# Patient Record
Sex: Female | Born: 1955 | Race: White | Hispanic: No | Marital: Married
Health system: Southern US, Academic
[De-identification: ages and names within clinical notes are randomized; demographics above are authoritative.]

## PROBLEM LIST (undated history)

## (undated) ENCOUNTER — Encounter: Attending: Internal Medicine | Primary: Internal Medicine

## (undated) ENCOUNTER — Encounter: Payer: PRIVATE HEALTH INSURANCE | Attending: Physician Assistant | Primary: Physician Assistant

## (undated) ENCOUNTER — Telehealth

## (undated) ENCOUNTER — Encounter

## (undated) ENCOUNTER — Ambulatory Visit

## (undated) ENCOUNTER — Encounter: Attending: Cardiovascular Disease | Primary: Cardiovascular Disease

## (undated) ENCOUNTER — Encounter: Attending: Ambulatory Care | Primary: Ambulatory Care

## (undated) ENCOUNTER — Ambulatory Visit: Payer: Medicare (Managed Care)

## (undated) ENCOUNTER — Other Ambulatory Visit: Attending: Ambulatory Care | Primary: Ambulatory Care

## (undated) ENCOUNTER — Ambulatory Visit: Payer: PRIVATE HEALTH INSURANCE

## (undated) ENCOUNTER — Ambulatory Visit: Payer: MEDICARE

## (undated) ENCOUNTER — Other Ambulatory Visit

## (undated) ENCOUNTER — Ambulatory Visit: Attending: Pharmacist | Primary: Pharmacist

## (undated) ENCOUNTER — Ambulatory Visit: Payer: PRIVATE HEALTH INSURANCE | Attending: Retina Specialist | Primary: Retina Specialist

## (undated) ENCOUNTER — Telehealth: Attending: Internal Medicine | Primary: Internal Medicine

## (undated) ENCOUNTER — Encounter: Payer: PRIVATE HEALTH INSURANCE | Attending: Internal Medicine | Primary: Internal Medicine

## (undated) ENCOUNTER — Ambulatory Visit: Attending: Internal Medicine | Primary: Internal Medicine

## (undated) ENCOUNTER — Ambulatory Visit: Attending: Family Medicine | Primary: Family Medicine

## (undated) ENCOUNTER — Ambulatory Visit: Payer: PRIVATE HEALTH INSURANCE | Attending: Internal Medicine | Primary: Internal Medicine

## (undated) ENCOUNTER — Ambulatory Visit: Payer: MEDICARE | Attending: Ambulatory Care | Primary: Ambulatory Care

## (undated) ENCOUNTER — Ambulatory Visit: Payer: PRIVATE HEALTH INSURANCE | Attending: Cardiovascular Disease | Primary: Cardiovascular Disease

## (undated) ENCOUNTER — Ambulatory Visit
Payer: PRIVATE HEALTH INSURANCE | Attending: Physical Medicine & Rehabilitation | Primary: Physical Medicine & Rehabilitation

## (undated) ENCOUNTER — Telehealth: Attending: Ambulatory Care | Primary: Ambulatory Care

## (undated) ENCOUNTER — Encounter: Attending: Diagnostic Radiology | Primary: Diagnostic Radiology

## (undated) DIAGNOSIS — E119 Type 2 diabetes mellitus without complications: Secondary | ICD-10-CM

## (undated) DIAGNOSIS — I10 Essential (primary) hypertension: Secondary | ICD-10-CM

## (undated) DIAGNOSIS — I2510 Atherosclerotic heart disease of native coronary artery without angina pectoris: Secondary | ICD-10-CM

## (undated) DIAGNOSIS — M353 Polymyalgia rheumatica: Secondary | ICD-10-CM

## (undated) DIAGNOSIS — E785 Hyperlipidemia, unspecified: Secondary | ICD-10-CM

## (undated) DIAGNOSIS — E669 Obesity, unspecified: Secondary | ICD-10-CM

## (undated) HISTORY — DX: Atherosclerotic heart disease of native coronary artery without angina pectoris: I25.10

## (undated) HISTORY — DX: Type 2 diabetes mellitus without complications: E11.9

## (undated) HISTORY — DX: Polymyalgia rheumatica: M35.3

## (undated) HISTORY — PX: CHOLECYSTECTOMY: SHX55

## (undated) HISTORY — DX: Essential (primary) hypertension: I10

## (undated) HISTORY — PX: ABDOMINAL HYSTERECTOMY: SHX81

---

## 1999-10-13 ENCOUNTER — Other Ambulatory Visit: Admission: RE | Admit: 1999-10-13 | Discharge: 1999-10-13 | Payer: Self-pay | Admitting: Obstetrics & Gynecology

## 2000-10-17 ENCOUNTER — Other Ambulatory Visit: Admission: RE | Admit: 2000-10-17 | Discharge: 2000-10-17 | Payer: Self-pay | Admitting: Obstetrics & Gynecology

## 2002-07-10 ENCOUNTER — Other Ambulatory Visit: Admission: RE | Admit: 2002-07-10 | Discharge: 2002-07-10 | Payer: Self-pay | Admitting: Obstetrics & Gynecology

## 2003-09-18 ENCOUNTER — Other Ambulatory Visit: Admission: RE | Admit: 2003-09-18 | Discharge: 2003-09-18 | Payer: Self-pay | Admitting: Obstetrics & Gynecology

## 2005-09-30 ENCOUNTER — Ambulatory Visit: Payer: Self-pay | Admitting: Cardiology

## 2005-09-30 ENCOUNTER — Ambulatory Visit (HOSPITAL_COMMUNITY): Admission: RE | Admit: 2005-09-30 | Discharge: 2005-09-30 | Payer: Self-pay | Admitting: Internal Medicine

## 2005-10-04 ENCOUNTER — Other Ambulatory Visit: Admission: RE | Admit: 2005-10-04 | Discharge: 2005-10-04 | Payer: Self-pay | Admitting: Obstetrics & Gynecology

## 2006-01-05 ENCOUNTER — Encounter (INDEPENDENT_AMBULATORY_CARE_PROVIDER_SITE_OTHER): Payer: Self-pay | Admitting: *Deleted

## 2006-01-05 ENCOUNTER — Ambulatory Visit (HOSPITAL_COMMUNITY): Admission: RE | Admit: 2006-01-05 | Discharge: 2006-01-05 | Payer: Self-pay | Admitting: Obstetrics & Gynecology

## 2008-11-10 ENCOUNTER — Ambulatory Visit: Payer: Self-pay | Admitting: Gastroenterology

## 2008-11-10 DIAGNOSIS — K594 Anal spasm: Secondary | ICD-10-CM | POA: Insufficient documentation

## 2008-11-21 ENCOUNTER — Ambulatory Visit: Payer: Self-pay | Admitting: Gastroenterology

## 2010-09-27 LAB — GLUCOSE, CAPILLARY: Glucose-Capillary: 113 mg/dL — ABNORMAL HIGH (ref 70–99)

## 2010-11-05 NOTE — Op Note (Signed)
NAMEJANAYSHA, DEPAULO             ACCOUNT NO.:  [id number]   MEDICAL RECORD NO.:  [id number]          PATIENT TYPE:  AMB   LOCATION:  SDC                           FACILITY:  [facility]   PHYSICIAN:  Freddy Finner, M.D.   DATE OF BIRTH:  08/20/1955   DATE OF PROCEDURE:  DATE OF DISCHARGE:                                 OPERATIVE REPORT   PREOPERATIVE DIAGNOSIS:  Chronic, persistent, complex ovarian cystic masses.   POSTOPERATIVE DIAGNOSIS:  Chronic, persistent, complex ovarian masses, plus  dense pelvic adhesions.   OPERATIVE PROCEDURE:  Laparoscopically-assisted bilateral salpingo-  oophorectomy, lysis of pelvic adhesions.   ANESTHESIA:  General.   INTRAOPERATIVE COMPLICATIONS:  None.   ESTIMATED INTRAOPERATIVE BLOOD LOSS:  Less than 250 milliliters.   The patient is a 55 year old who has previously had a hysterectomy.  She has  been found to have persistent complex left ovarian cysts, possibly  representing a dermoid.  There were some follicular-type cysts in the right  ovary.  Based on the patient's age, it was elected to proceed with bilateral  salpingo-oophorectomy.  She is admitted at this time for that purpose.  She  is admitted on the morning of surgery, brought to the operating room, placed  under adequate general endotracheal anesthesia and placed in the dorsal  lithotomy position using the [facility] stirrup system.  Betadine prep was  carried out in the usual fashion.  Bladder was evacuated using sterile  technique.  Sponge forceps with sponges on the tip was left in the vagina  for potential traction and identification during the procedure.  A small  intraumbilical skin incision was made and through it a 9 mm __________ blade  disposable trocar introduced, while elevating the anterior abdominal wall  manually.  Direct inspection with a laparoscope revealed adequate placement  with no evidence of injury on entry.  Pneumoperitoneum was allowed to  accumulate with carbon  dioxide gas.  Inspection of the pelvic contents and  anterior abdominal wall was carried out.  There were no adhesions in the  lower pelvis.  A small incision was made just above the symphysis and  through it another 11 mm non __________ disposable trocar was introduced.  There were some omental adhesions to the anterior abdominal wall partially  interfering with the visibility to the pelvis, and these were just to the  left of midline.  A portion of these were lysed using the Gyrus tripolar  device.  Upper abdomen was inspected, the liver appeared to be normal.  There were no apparent abnormalities.  There was an old suture remnant on  the cecum consistent with previous appendectomy.  The adnexal structures  including ovaries and tubes were adherent in the midline.  There was a lip  of sigmoid colon adherent to the peritoneum overlying bladder and vagina.  This was carefully lysed using the tripolar device.  Photographs were made  of pelvic findings.  Using the spring-loaded grasping forceps, the left  adnexa was elevated.  There were adhesions to the pelvic side wall.  With  great care, the infundibulopelvic ligament was enveloped, sealed, and  divided  using the Gyres device.  This dissection was carried close to the  ovary along the pelvic side wall in an attempt to avoid other structures.  Then, the ovary was dissected freely.  It was softly morcellated and  delivered through the lower trocar using a large grasping forceps commonly  called a claw in the OR.  The right side was __________ in a centrally  identical fashion and removed.  Small fragments of the ovary were retrieved  either through the operating channels of the laparoscope with the Tripolar  forceps, or through the lower trocar sleeve with a claw.  Copious irrigation  was carried out.  Hemostasis was noted to be adequate.  All visible evidence  of ovarian remnants were removed, hemostasis was complete.  All of the   irrigating solution was aspirated from the abdomen.  The instruments were removed, both incisions were closed with a deep suture  in the fascia of 0 Vicryl and subcuticular sutures of interrupted 3-0 Dexon.  Hemostasis was completed, the dressings were applied, the patient was taken  to recovery in good condition.      Freddy Finner, M.D.  Electronically Signed     WRN/MEDQ  D:  01/05/2006  T:  01/05/2006  Job:  [id number]

## 2010-11-05 NOTE — Procedures (Signed)
Cassie Cordova, Cassie Cordova             ACCOUNT NO.:  [id number]   MEDICAL RECORD NO.:  [id number]          PATIENT TYPE:  OUT   LOCATION:  RAD                           FACILITY:  [facility]   PHYSICIAN:  [place] Bing, M.D. [facility] OF BIRTH:  16-Jan-1956   DATE OF PROCEDURE:  09/30/2005  DATE OF DISCHARGE:                                  ECHOCARDIOGRAM   REFERRING PHYSICIAN:  Dr. Carylon Perches   CLINICAL DATA:  A 55 year old woman with arrhythmia and hypertension.   M-MODE:  Aorta 2.8, left atrium 4.6, septum 1.2, posterior wall 1.2, LV  diastole 4.1, LV systole 2.7.   IMPRESSION:  1.  Technically adequate echocardiographic study.  2.  Mild left atrial enlargement; right atrial size at the upper limit of      normal; normal right ventricle.  3.  Normal mitral valve with trivial regurgitation.  4.  Normal trileaflet aortic valve.  5.  Normal tricuspid valve; mild regurgitation; normal estimated right      ventricular systolic pressure.  6.  Normal pulmonic valve and proximal pulmonary artery.  7.  Normal left ventricular size; mild hypertrophy; normal regional and      global function.  8.  Normal inferior vena cava.      [address] Bing, M.D. [facility]  Electronically Signed     RR/MEDQ  D:  10/01/2005  T:  10/01/2005  Job:  [id number]

## 2011-12-07 ENCOUNTER — Other Ambulatory Visit: Payer: Self-pay | Admitting: Endocrinology

## 2011-12-07 ENCOUNTER — Ambulatory Visit
Admission: RE | Admit: 2011-12-07 | Discharge: 2011-12-07 | Disposition: A | Discharge status: Home / Self Care | Payer: BC Managed Care – PPO | Source: Ambulatory Visit | Attending: Endocrinology | Admitting: Endocrinology

## 2011-12-07 DIAGNOSIS — E049 Nontoxic goiter, unspecified: Secondary | ICD-10-CM

## 2011-12-12 ENCOUNTER — Other Ambulatory Visit: Payer: Self-pay | Admitting: Endocrinology

## 2011-12-12 DIAGNOSIS — E041 Nontoxic single thyroid nodule: Secondary | ICD-10-CM

## 2011-12-13 ENCOUNTER — Other Ambulatory Visit (HOSPITAL_COMMUNITY)
Admission: RE | Admit: 2011-12-13 | Discharge: 2011-12-13 | Disposition: A | Discharge status: Home / Self Care | Payer: BC Managed Care – PPO | Source: Ambulatory Visit | Attending: Interventional Radiology | Admitting: Interventional Radiology

## 2011-12-13 ENCOUNTER — Ambulatory Visit
Admission: RE | Admit: 2011-12-13 | Discharge: 2011-12-13 | Disposition: A | Discharge status: Home / Self Care | Payer: BC Managed Care – PPO | Source: Ambulatory Visit | Attending: Endocrinology | Admitting: Endocrinology

## 2011-12-13 DIAGNOSIS — E041 Nontoxic single thyroid nodule: Secondary | ICD-10-CM

## 2011-12-14 ENCOUNTER — Other Ambulatory Visit: Payer: BC Managed Care – PPO

## 2013-11-25 ENCOUNTER — Other Ambulatory Visit: Payer: Self-pay | Admitting: Endocrinology

## 2013-11-25 DIAGNOSIS — E010 Iodine-deficiency related diffuse (endemic) goiter: Secondary | ICD-10-CM

## 2013-11-28 ENCOUNTER — Ambulatory Visit
Admission: RE | Admit: 2013-11-28 | Discharge: 2013-11-28 | Disposition: A | Discharge status: Home / Self Care | Payer: BC Managed Care – PPO | Source: Ambulatory Visit | Attending: Endocrinology | Admitting: Endocrinology

## 2013-11-28 DIAGNOSIS — E010 Iodine-deficiency related diffuse (endemic) goiter: Secondary | ICD-10-CM

## 2014-02-15 ENCOUNTER — Encounter: Payer: Self-pay | Admitting: Gastroenterology

## 2014-12-29 ENCOUNTER — Other Ambulatory Visit: Payer: Self-pay | Admitting: Obstetrics & Gynecology

## 2014-12-30 LAB — CYTOLOGY - PAP

## 2015-01-29 LAB — HEMOGLOBIN A1C: Hgb A1c MFr Bld: 11 % — AB (ref 4.0–6.0)

## 2015-03-26 ENCOUNTER — Ambulatory Visit: Payer: Self-pay | Admitting: "Endocrinology

## 2015-04-16 LAB — HEMOGLOBIN A1C: Hgb A1c MFr Bld: 8.8 % — AB (ref 4.0–6.0)

## 2015-04-22 ENCOUNTER — Ambulatory Visit (INDEPENDENT_AMBULATORY_CARE_PROVIDER_SITE_OTHER): Payer: BLUE CROSS/BLUE SHIELD | Admitting: "Endocrinology

## 2015-04-22 ENCOUNTER — Encounter: Payer: Self-pay | Admitting: "Endocrinology

## 2015-04-22 ENCOUNTER — Ambulatory Visit: Payer: Self-pay | Admitting: "Endocrinology

## 2015-04-22 VITALS — BP 155/90 | HR 72 | Ht 61.0 in | Wt 170.0 lb

## 2015-04-22 DIAGNOSIS — E1159 Type 2 diabetes mellitus with other circulatory complications: Secondary | ICD-10-CM | POA: Insufficient documentation

## 2015-04-22 DIAGNOSIS — E785 Hyperlipidemia, unspecified: Secondary | ICD-10-CM

## 2015-04-22 DIAGNOSIS — E559 Vitamin D deficiency, unspecified: Secondary | ICD-10-CM | POA: Diagnosis not present

## 2015-04-22 DIAGNOSIS — I10 Essential (primary) hypertension: Secondary | ICD-10-CM

## 2015-04-22 DIAGNOSIS — E782 Mixed hyperlipidemia: Secondary | ICD-10-CM | POA: Insufficient documentation

## 2015-04-22 MED ORDER — LISINOPRIL 5 MG PO TABS: 5.0000 mg | ORAL_TABLET | Freq: Every day | ORAL | Status: DC

## 2015-04-22 MED ORDER — VITAMIN D (ERGOCALCIFEROL) 1.25 MG (50000 UNIT) PO CAPS: 50000.0000 [IU] | ORAL_CAPSULE | ORAL | Status: DC

## 2015-04-22 NOTE — Patient Instructions (Signed)

## 2015-04-22 NOTE — Progress Notes (Signed)
Subjective:    Patient ID: Cassie Cordova, female    DOB: Apr 07, 1956,    Past Medical History  Diagnosis Date  . Hypertension   . Diabetes mellitus, type II (HCC)   . CAD (coronary artery disease)    Past Surgical History  Procedure Laterality Date  . Cholecystectomy    . Abdominal hysterectomy    . Cesarean section     Social History   Social History  . Marital Status: Married    Spouse Name: N/A  . Number of Children: N/A  . Years of Education: N/A   Social History Main Topics  . Smoking status: Never Smoker   . Smokeless tobacco: None  . Alcohol Use: No  . Drug Use: No  . Sexual Activity: Not Asked   Other Topics Concern  . None   Social History Narrative  . None   Outpatient Encounter Prescriptions as of 04/22/2015  Medication Sig  . aspirin EC 81 MG tablet Take 81 mg by mouth daily.  Marland Kitchen. atorvastatin (LIPITOR) 20 MG tablet Take 20 mg by mouth daily.  . canagliflozin (INVOKANA) 100 MG TABS tablet Take 100 mg by mouth daily.  Marland Kitchen. co-enzyme Q-10 30 MG capsule Take 30 mg by mouth daily.  . Dulaglutide (TRULICITY) 0.75 MG/0.5ML SOPN Inject into the skin once a week.  . Insulin Glargine (TOUJEO SOLOSTAR) 300 UNIT/ML SOPN Inject 15 Units into the skin at bedtime.  Marland Kitchen. lisinopril (PRINIVIL,ZESTRIL) 5 MG tablet Take 1 tablet (5 mg total) by mouth daily.  . metoprolol succinate (TOPROL-XL) 25 MG 24 hr tablet Take 25 mg by mouth daily.  . nitroGLYCERIN (NITRODUR - DOSED IN MG/24 HR) 0.4 mg/hr patch Place 0.4 mg onto the skin daily.  Marland Kitchen. venlafaxine (EFFEXOR) 75 MG tablet Take 75 mg by mouth daily.   . [DISCONTINUED] lisinopril (PRINIVIL,ZESTRIL) 2.5 MG tablet Take 2.5 mg by mouth daily.  . Vitamin D, Ergocalciferol, (DRISDOL) 50000 UNITS CAPS capsule Take 1 capsule (50,000 Units total) by mouth every 7 (seven) days.   No facility-administered encounter medications on file as of 04/22/2015.   ALLERGIES: No Known Allergies VACCINATION STATUS:  There is no immunization  history on file for this patient.  Diabetes She presents for her follow-up diabetic visit. She has type 2 diabetes mellitus. Onset time: She was diagnosed at approximate age of 47 years. Her disease course has been improving. There are no hypoglycemic associated symptoms. Pertinent negatives for hypoglycemia include no confusion, headaches, pallor or seizures. Associated symptoms include fatigue. Pertinent negatives for diabetes include no chest pain, no polydipsia, no polyphagia and no polyuria. There are no hypoglycemic complications. Symptoms are improving. Diabetic complications include heart disease. Risk factors for coronary artery disease include diabetes mellitus, dyslipidemia, hypertension and sedentary lifestyle. Current diabetic treatment includes insulin injections and oral agent (monotherapy). She is compliant with treatment some of the time. Her weight is stable. She has had a previous visit with a dietitian. She rarely participates in exercise. Home blood sugar record trend: She did not bring her meter nor blood glucose logs. An ACE inhibitor/angiotensin II receptor blocker is being taken. Eye exam is current.  Hypertension This is a chronic problem. The problem is uncontrolled. Pertinent negatives include no chest pain, headaches, palpitations or shortness of breath. Risk factors for coronary artery disease include dyslipidemia, diabetes mellitus and sedentary lifestyle. Past treatments include ACE inhibitors. The current treatment provides no improvement.  Hyperlipidemia This is a chronic problem. The current episode started more than 1  year ago. The problem is controlled. Recent lipid tests were reviewed and are normal. Pertinent negatives include no chest pain, myalgias or shortness of breath. Current antihyperlipidemic treatment includes statins. Risk factors for coronary artery disease include dyslipidemia, diabetes mellitus, hypertension and obesity.     Review of Systems   Constitutional: Positive for fatigue. Negative for unexpected weight change.  HENT: Negative for trouble swallowing and voice change.   Eyes: Negative for visual disturbance.  Respiratory: Negative for cough, shortness of breath and wheezing.   Cardiovascular: Negative for chest pain, palpitations and leg swelling.       Over the last several weeks she felt heaviness in her chest, went to see her cardiologist , she did stress test today,  awaiting for her results..  Gastrointestinal: Negative for nausea, vomiting and diarrhea.  Endocrine: Negative for cold intolerance, heat intolerance, polydipsia, polyphagia and polyuria.  Musculoskeletal: Negative for myalgias and arthralgias.  Skin: Negative for color change, pallor, rash and wound.  Neurological: Negative for seizures and headaches.  Psychiatric/Behavioral: Negative for suicidal ideas and confusion.    Objective:    BP 155/90 mmHg  Pulse 72  Ht  (1.549 m)  Wt 170 lb (77.111 kg)  BMI 32.14 kg/m2  SpO2 96%  Wt Readings from Last 3 Encounters:  04/22/15 170 lb (77.111 kg)  11/10/08 159 lb (72.122 kg)    Physical Exam  Constitutional: She is oriented to person, place, and time. She appears well-developed.  HENT:  Head: Normocephalic and atraumatic.  Eyes: EOM are normal.  Neck: Normal range of motion. Neck supple. No tracheal deviation present. No thyromegaly present.  Cardiovascular: Normal rate and regular rhythm.   Pulmonary/Chest: Effort normal and breath sounds normal.  Abdominal: Soft. Bowel sounds are normal. There is no tenderness. There is no guarding.  Musculoskeletal: Normal range of motion. She exhibits no edema.  Neurological: She is alert and oriented to person, place, and time. She has normal reflexes. No cranial nerve deficit. Coordination normal.  Skin: Skin is warm and dry. No rash noted. No erythema. No pallor.  Psychiatric: She has a normal mood and affect. Judgment normal.    Results for orders  placed or performed in visit on 04/22/15  Hemoglobin A1c  Result Value Ref Range   Hgb A1c MFr Bld 8.8 (A) 4.0 - 6.0 %  Hemoglobin A1c  Result Value Ref Range   Hgb A1c MFr Bld 11.0 (A) 4.0 - 6.0 %   Complete Blood Count (Most recent): No results found for: WBC, HGB, HCT, MCV, PLT Chemistry (most recent): No results found for: NA, K, CL, CO2, BUN, CREATININE, GLUF Diabetic Labs (most recent): Lab Results  Component Value Date   HGBA1C 8.8* 04/16/2015   HGBA1C 11.0* 01/29/2015     Assessment & Plan:   1. Type 2 diabetes mellitus with vascular disease (HCC)  -Her diabetes is  complicated by coronary artery disease and patient remains at a high risk for more acute and chronic complications of diabetes which include CAD, CVA, CKD, retinopathy, and neuropathy. These are all discussed in detail with the patient.  Patient came with improved A1c of 8.8 %, liver she did not bring her meter nor blood glucose logs for review.  Recent labs reviewed.   - I have re-counseled the patient on diet management and weight loss  by adopting a carbohydrate restricted / protein rich  Diet.  - Suggestion is made for patient to avoid simple carbohydrates   from their diet including Cakes ,  Desserts, Ice Cream,  Soda (  diet and regular) , Sweet Tea , Candies,  Chips, Cookies, Artificial Sweeteners,   and "Sugar-free" Products .  This will help patient to have stable blood glucose profile and potentially avoid unintended  Weight gain.  - Patient is advised to stick to a routine mealtimes to eat 3 meals  a day and avoid unnecessary snacks ( to snack only to correct hypoglycemia).  - I have approached patient with the following individualized plan to manage diabetes and patient agrees.  - I will increase Toujeo to 20 units QHS, associated with strict monitoring of glucose  QAM.  - She would not need prandial insulin for now.  -Patient is encouraged to call clinic for blood glucose levels less than 70  or above 200 mg /dl. - I will continue Invokana 100 mg by mouth every morning, therapeutically suitable for patient. -She does not tolerate metformin. -I will continue Trulicity 0.75 mg weekly.  - Patient specific target  for A1c; LDL, HDL, Triglycerides, and  Waist Circumference were discussed in detail.  2) BP/HTN: Uncontrolled.  I advised her to increase fosinopril to 5 mg by mouth every morning  along with her metoprolol 25 mg by mouth every morning. She is hesitant to add medications.  3) Lipids/HPL:  continue statins. 4)  Weight/Diet: CDE consult in progress, exercise, and carbohydrates information provided.  5) Vitamin D deficiency -New diagnosis. I will initiate vitamin D 50,000 units by mouth weekly for the next 12 weeks.  5) Chronic Care/Health Maintenance:  -Patient  on ACEI/ARB and Statin medications and encouraged to continue to follow up with Ophthalmology, Podiatrist at least yearly or according to recommendations, and advised to  stay away from smoking. I have recommended yearly flu vaccine and pneumonia vaccination at least every 5 years; moderate intensity exercise for up to 150 minutes weekly; and  sleep for at least 7 hours a day.  I advised patient to maintain close follow up with their PCP for primary care needs.  Patient is asked to bring meter and  blood glucose logs during their next visit.   Follow up plan: Return in about 3 months (around 07/23/2015) for diabetes, high blood pressure, high cholesterol.  Marquis Lunch, MD Phone: [phone]  Fax: [phone]   04/22/2015, 8:47 PM

## 2015-06-08 ENCOUNTER — Other Ambulatory Visit: Payer: Self-pay | Admitting: "Endocrinology

## 2015-07-17 ENCOUNTER — Other Ambulatory Visit: Payer: Self-pay | Admitting: "Endocrinology

## 2015-07-17 LAB — BASIC METABOLIC PANEL
BUN: 14 mg/dL (ref 7–25)
CHLORIDE: 104 mmol/L (ref 98–110)
CO2: 26 mmol/L (ref 20–31)
Calcium: 8.9 mg/dL (ref 8.6–10.4)
Creat: 0.81 mg/dL (ref 0.50–1.05)
Glucose, Bld: 144 mg/dL — ABNORMAL HIGH (ref 65–99)
POTASSIUM: 4.4 mmol/L (ref 3.5–5.3)
SODIUM: 138 mmol/L (ref 135–146)

## 2015-07-17 LAB — HEMOGLOBIN A1C
Hgb A1c MFr Bld: 9.8 % — ABNORMAL HIGH (ref <5.7)
MEAN PLASMA GLUCOSE: 235 mg/dL — AB (ref <117)

## 2015-07-23 ENCOUNTER — Ambulatory Visit: Payer: BLUE CROSS/BLUE SHIELD | Admitting: "Endocrinology

## 2015-08-05 ENCOUNTER — Encounter: Payer: Self-pay | Admitting: "Endocrinology

## 2015-08-05 ENCOUNTER — Ambulatory Visit (INDEPENDENT_AMBULATORY_CARE_PROVIDER_SITE_OTHER): Payer: BLUE CROSS/BLUE SHIELD | Admitting: "Endocrinology

## 2015-08-05 VITALS — BP 140/88 | HR 68 | Ht 61.0 in | Wt 168.0 lb

## 2015-08-05 DIAGNOSIS — I10 Essential (primary) hypertension: Secondary | ICD-10-CM

## 2015-08-05 DIAGNOSIS — E1159 Type 2 diabetes mellitus with other circulatory complications: Secondary | ICD-10-CM

## 2015-08-05 DIAGNOSIS — E559 Vitamin D deficiency, unspecified: Secondary | ICD-10-CM

## 2015-08-05 DIAGNOSIS — E785 Hyperlipidemia, unspecified: Secondary | ICD-10-CM | POA: Diagnosis not present

## 2015-08-05 NOTE — Progress Notes (Signed)
Subjective:    Patient ID: Cassie Cordova, female    DOB: Jun 20, 1956,    Past Medical History  Diagnosis Date  . Hypertension   . Diabetes mellitus, type II (HCC)   . CAD (coronary artery disease)    Past Surgical History  Procedure Laterality Date  . Cholecystectomy    . Abdominal hysterectomy    . Cesarean section     Social History   Social History  . Marital Status: Married    Spouse Name: N/A  . Number of Children: N/A  . Years of Education: N/A   Social History Main Topics  . Smoking status: Never Smoker   . Smokeless tobacco: None  . Alcohol Use: No  . Drug Use: No  . Sexual Activity: Not Asked   Other Topics Concern  . None   Social History Narrative   Outpatient Encounter Prescriptions as of 08/05/2015  Medication Sig  . canagliflozin (INVOKANA) 100 MG TABS tablet Take 100 mg by mouth daily.  . Insulin Glargine (TOUJEO SOLOSTAR) 300 UNIT/ML SOPN Inject 30 Units into the skin at bedtime.  . TRULICITY 0.75 MG/0.5ML SOPN INJECT 0.75MG  UNDER THE SKIN ONCE WEEKLY  . aspirin EC 81 MG tablet Take 81 mg by mouth daily.  Marland Kitchen atorvastatin (LIPITOR) 20 MG tablet Take 20 mg by mouth daily.  Marland Kitchen co-enzyme Q-10 30 MG capsule Take 30 mg by mouth daily.  Marland Kitchen lisinopril (PRINIVIL,ZESTRIL) 5 MG tablet Take 1 tablet (5 mg total) by mouth daily.  . metoprolol succinate (TOPROL-XL) 25 MG 24 hr tablet Take 25 mg by mouth daily.  . nitroGLYCERIN (NITRODUR - DOSED IN MG/24 HR) 0.4 mg/hr patch Place 0.4 mg onto the skin daily.  Marland Kitchen venlafaxine (EFFEXOR) 75 MG tablet Take 75 mg by mouth daily.   . [DISCONTINUED] Vitamin D, Ergocalciferol, (DRISDOL) 50000 UNITS CAPS capsule Take 1 capsule (50,000 Units total) by mouth every 7 (seven) days.   No facility-administered encounter medications on file as of 08/05/2015.   ALLERGIES: No Known Allergies VACCINATION STATUS:  There is no immunization history on file for this patient.  Diabetes She presents for her follow-up diabetic  visit. She has type 2 diabetes mellitus. Onset time: She was diagnosed at approximate age of 47 years. Her disease course has been worsening. There are no hypoglycemic associated symptoms. Pertinent negatives for hypoglycemia include no confusion, headaches, pallor or seizures. Associated symptoms include fatigue. Pertinent negatives for diabetes include no chest pain, no polydipsia, no polyphagia and no polyuria. There are no hypoglycemic complications. Symptoms are worsening. Diabetic complications include heart disease. Risk factors for coronary artery disease include diabetes mellitus, dyslipidemia, hypertension and sedentary lifestyle. Current diabetic treatment includes insulin injections and oral agent (monotherapy). She is compliant with treatment some of the time (She admits inconsistency in taking her invokana and Trulicity. She also missed some doses of Toujeo as well.). Her weight is stable. She is following a generally unhealthy diet. When asked about meal planning, she reported none. She has had a previous visit with a dietitian. She rarely participates in exercise. Home blood sugar record trend: She did not bring her log, her meter shows in adequate monitoring average 187. Her overall blood glucose range is >200 mg/dl. An ACE inhibitor/angiotensin II receptor blocker is being taken. Eye exam is current.  Hypertension This is a chronic problem. The problem is uncontrolled. Pertinent negatives include no chest pain, headaches, palpitations or shortness of breath. Risk factors for coronary artery disease include dyslipidemia, diabetes mellitus  and sedentary lifestyle. Past treatments include ACE inhibitors. The current treatment provides no improvement.  Hyperlipidemia This is a chronic problem. The current episode started more than 1 year ago. The problem is controlled. Recent lipid tests were reviewed and are normal. Pertinent negatives include no chest pain, myalgias or shortness of breath.  Current antihyperlipidemic treatment includes statins. Risk factors for coronary artery disease include dyslipidemia, diabetes mellitus, hypertension and obesity.     Review of Systems  Constitutional: Positive for fatigue. Negative for unexpected weight change.  HENT: Negative for trouble swallowing and voice change.   Eyes: Negative for visual disturbance.  Respiratory: Negative for cough, shortness of breath and wheezing.   Cardiovascular: Negative for chest pain, palpitations and leg swelling.  Gastrointestinal: Negative for nausea, vomiting and diarrhea.  Endocrine: Negative for cold intolerance, heat intolerance, polydipsia, polyphagia and polyuria.  Musculoskeletal: Negative for myalgias and arthralgias.  Skin: Negative for color change, pallor, rash and wound.  Neurological: Negative for seizures and headaches.  Psychiatric/Behavioral: Negative for suicidal ideas and confusion.    Objective:    BP 140/88 mmHg  Pulse 68  Ht  (1.549 m)  Wt 168 lb (76.204 kg)  BMI 31.76 kg/m2  SpO2 98%  Wt Readings from Last 3 Encounters:  08/05/15 168 lb (76.204 kg)  04/22/15 170 lb (77.111 kg)  11/10/08 159 lb (72.122 kg)    Physical Exam  Constitutional: She is oriented to person, place, and time. She appears well-developed.  HENT:  Head: Normocephalic and atraumatic.  Eyes: EOM are normal.  Neck: Normal range of motion. Neck supple. No tracheal deviation present. No thyromegaly present.  Cardiovascular: Normal rate and regular rhythm.   Pulmonary/Chest: Effort normal and breath sounds normal.  Abdominal: Soft. Bowel sounds are normal. There is no tenderness. There is no guarding.  Musculoskeletal: Normal range of motion. She exhibits no edema.  Neurological: She is alert and oriented to person, place, and time. She has normal reflexes. No cranial nerve deficit. Coordination normal.  Skin: Skin is warm and dry. No rash noted. No erythema. No pallor.  Psychiatric: She has a  normal mood and affect. Judgment normal.    Results for orders placed or performed in visit on 07/17/15  Basic metabolic panel  Result Value Ref Range   Sodium 138 135 - 146 mmol/L   Potassium 4.4 3.5 - 5.3 mmol/L   Chloride 104 98 - 110 mmol/L   CO2 26 20 - 31 mmol/L   Glucose, Bld 144 (H) 65 - 99 mg/dL   BUN 14 7 - 25 mg/dL   Creat 4.74 2.59 - 5.63 mg/dL   Calcium 8.9 8.6 - 87.5 mg/dL  Hemoglobin I4P  Result Value Ref Range   Hgb A1c MFr Bld 9.8 (H) <5.7 %   Mean Plasma Glucose 235 (H) <117 mg/dL   Complete Blood Count (Most recent): No results found for: WBC, HGB, HCT, MCV, PLT Chemistry (most recent): Lab Results  Component Value Date   NA 138 07/17/2015   K 4.4 07/17/2015   CL 104 07/17/2015   CO2 26 07/17/2015   BUN 14 07/17/2015   CREATININE 0.81 07/17/2015   Diabetic Labs (most recent): Lab Results  Component Value Date   HGBA1C 9.8* 07/17/2015   HGBA1C 8.8* 04/16/2015   HGBA1C 11.0* 01/29/2015     Assessment & Plan:   1. Type 2 diabetes mellitus with vascular disease (HCC)  -Her diabetes is  complicated by coronary artery disease and patient remains at a high  risk for more acute and chronic complications of diabetes which include CAD, CVA, CKD, retinopathy, and neuropathy. These are all discussed in detail with the patient.  Patient came with worsening A1c of 9.8 from 8.8 %, mainly due to inconsistency in her medications.   - I have re-counseled the patient on diet management and weight loss  by adopting a carbohydrate restricted / protein rich  Diet.  - Suggestion is made for patient to avoid simple carbohydrates   from their diet including Cakes , Desserts, Ice Cream,  Soda (  diet and regular) , Sweet Tea , Candies,  Chips, Cookies, Artificial Sweeteners,   and "Sugar-free" Products .  This will help patient to have stable blood glucose profile and potentially avoid unintended  Weight gain.  - Patient is advised to stick to a routine mealtimes to eat 3  meals  a day and avoid unnecessary snacks ( to snack only to correct hypoglycemia).  - I have approached patient with the following individualized plan to manage diabetes and patient agrees.  - I will increase Toujeo to 30 units QHS, associated with strict monitoring of glucose   before meals and at bedtime . -She may need prandial insulin based on her readings when she comes back in a week.   -Patient is encouraged to call clinic for blood glucose levels less than 70 or above 200 mg /dl. - I advised her team to be more consistent and resume Invokana 100 mg by mouth every morning, therapeutically suitable for patient. -She does not tolerate metformin. -I will continue Trulicity 0.75 mg weekly.  - Patient specific target  for A1c; LDL, HDL, Triglycerides, and  Waist Circumference were discussed in detail.  2) BP/HTN: Improved.  I advised her to  continue fosinopril t5 mg by mouth every morning  along with her metoprolol 25 mg by mouth every morning.  3) Lipids/HPL:  continue statins. 4)  Weight/Diet: CDE consult in progress, exercise, and carbohydrates information provided.  5) Vitamin D deficiency -New diagnosis. I will initiate vitamin D 50,000 units by mouth weekly for the next 12 weeks.  5) Chronic Care/Health Maintenance:  -Patient  on ACEI/ARB and Statin medications and encouraged to continue to follow up with Ophthalmology, Podiatrist at least yearly or according to recommendations, and advised to  stay away from smoking. I have recommended yearly flu vaccine and pneumonia vaccination at least every 5 years; moderate intensity exercise for up to 150 minutes weekly; and  sleep for at least 7 hours a day.  I advised patient to maintain close follow up with their PCP for primary care needs.  Patient is asked to bring meter and  blood glucose logs during their next visit.   Follow up plan: Return in about 1 week (around 08/12/2015) for diabetes, high blood pressure, high cholesterol,  follow up with meter and logs- no labs.  Marquis Lunch, MD Phone: [phone]  Fax: [phone]   08/05/2015, 11:41 AM

## 2015-08-05 NOTE — Patient Instructions (Signed)

## 2015-08-12 ENCOUNTER — Encounter: Payer: Self-pay | Admitting: "Endocrinology

## 2015-08-12 ENCOUNTER — Ambulatory Visit (INDEPENDENT_AMBULATORY_CARE_PROVIDER_SITE_OTHER): Payer: BLUE CROSS/BLUE SHIELD | Admitting: "Endocrinology

## 2015-08-12 ENCOUNTER — Other Ambulatory Visit: Payer: Self-pay | Admitting: "Endocrinology

## 2015-08-12 VITALS — BP 102/63 | HR 62 | Ht 61.0 in | Wt 168.0 lb

## 2015-08-12 DIAGNOSIS — I10 Essential (primary) hypertension: Secondary | ICD-10-CM

## 2015-08-12 DIAGNOSIS — E785 Hyperlipidemia, unspecified: Secondary | ICD-10-CM | POA: Diagnosis not present

## 2015-08-12 DIAGNOSIS — E1159 Type 2 diabetes mellitus with other circulatory complications: Secondary | ICD-10-CM

## 2015-08-12 NOTE — Progress Notes (Signed)
Subjective:    Patient ID: Cassie Cordova, female    DOB: 1956/06/01,    Past Medical History  Diagnosis Date  . Hypertension   . Diabetes mellitus, type II (HCC)   . CAD (coronary artery disease)    Past Surgical History  Procedure Laterality Date  . Cholecystectomy    . Abdominal hysterectomy    . Cesarean section     Social History   Social History  . Marital Status: Married    Spouse Name: N/A  . Number of Children: N/A  . Years of Education: N/A   Social History Main Topics  . Smoking status: Never Smoker   . Smokeless tobacco: None  . Alcohol Use: No  . Drug Use: No  . Sexual Activity: Not Asked   Other Topics Concern  . None   Social History Narrative   Outpatient Encounter Prescriptions as of 08/12/2015  Medication Sig  . aspirin EC 81 MG tablet Take 81 mg by mouth daily.  Marland Kitchen atorvastatin (LIPITOR) 20 MG tablet Take 20 mg by mouth daily.  . canagliflozin (INVOKANA) 100 MG TABS tablet Take 100 mg by mouth daily.  Marland Kitchen co-enzyme Q-10 30 MG capsule Take 30 mg by mouth daily.  . Insulin Glargine (TOUJEO SOLOSTAR) 300 UNIT/ML SOPN Inject 24 Units into the skin at bedtime.  Marland Kitchen lisinopril (PRINIVIL,ZESTRIL) 5 MG tablet Take 1 tablet (5 mg total) by mouth daily.  . metoprolol succinate (TOPROL-XL) 25 MG 24 hr tablet Take 25 mg by mouth daily.  . nitroGLYCERIN (NITRODUR - DOSED IN MG/24 HR) 0.4 mg/hr patch Place 0.4 mg onto the skin daily.  . TRULICITY 0.75 MG/0.5ML SOPN INJECT 0.75MG  UNDER THE SKIN ONCE WEEKLY  . venlafaxine (EFFEXOR) 75 MG tablet Take 75 mg by mouth daily.    No facility-administered encounter medications on file as of 08/12/2015.   ALLERGIES: No Known Allergies VACCINATION STATUS:  There is no immunization history on file for this patient.  Diabetes She presents for her follow-up diabetic visit. She has type 2 diabetes mellitus. Onset time: She was diagnosed at approximate age of 47 years. Her disease course has been improving. There are  no hypoglycemic associated symptoms. Pertinent negatives for hypoglycemia include no confusion, headaches, pallor or seizures. Associated symptoms include fatigue. Pertinent negatives for diabetes include no chest pain, no polydipsia, no polyphagia and no polyuria. There are no hypoglycemic complications. Symptoms are improving. Diabetic complications include heart disease. Risk factors for coronary artery disease include diabetes mellitus, dyslipidemia, hypertension and sedentary lifestyle. Current diabetic treatment includes insulin injections and oral agent (monotherapy). She is compliant with treatment some of the time (She admits inconsistency in taking her invokana and Trulicity. She also missed some doses of Toujeo as well.). Her weight is stable. She is following a generally unhealthy diet. When asked about meal planning, she reported none. She has had a previous visit with a dietitian. She rarely participates in exercise. Home blood sugar record trend: She did  bring her log, her meter shows adequate monitoring average 144. Her overall blood glucose range is 130-140 mg/dl. An ACE inhibitor/angiotensin II receptor blocker is being taken. Eye exam is current.  Hypertension This is a chronic problem. The problem is uncontrolled. Pertinent negatives include no chest pain, headaches, palpitations or shortness of breath. Risk factors for coronary artery disease include dyslipidemia, diabetes mellitus and sedentary lifestyle. Past treatments include ACE inhibitors. The current treatment provides no improvement.  Hyperlipidemia This is a chronic problem. The current episode  started more than 1 year ago. The problem is controlled. Recent lipid tests were reviewed and are normal. Pertinent negatives include no chest pain, myalgias or shortness of breath. Current antihyperlipidemic treatment includes statins. Risk factors for coronary artery disease include dyslipidemia, diabetes mellitus, hypertension and  obesity.     Review of Systems  Constitutional: Positive for fatigue. Negative for unexpected weight change.  HENT: Negative for trouble swallowing and voice change.   Eyes: Negative for visual disturbance.  Respiratory: Negative for cough, shortness of breath and wheezing.   Cardiovascular: Negative for chest pain, palpitations and leg swelling.  Gastrointestinal: Negative for nausea, vomiting and diarrhea.  Endocrine: Negative for cold intolerance, heat intolerance, polydipsia, polyphagia and polyuria.  Musculoskeletal: Negative for myalgias and arthralgias.  Skin: Negative for color change, pallor, rash and wound.  Neurological: Negative for seizures and headaches.  Psychiatric/Behavioral: Negative for suicidal ideas and confusion.    Objective:    BP 102/63 mmHg  Pulse 62  Ht  (1.549 m)  Wt 168 lb (76.204 kg)  BMI 31.76 kg/m2  SpO2 98%  Wt Readings from Last 3 Encounters:  08/12/15 168 lb (76.204 kg)  08/05/15 168 lb (76.204 kg)  04/22/15 170 lb (77.111 kg)    Physical Exam  Constitutional: She is oriented to person, place, and time. She appears well-developed.  HENT:  Head: Normocephalic and atraumatic.  Eyes: EOM are normal.  Neck: Normal range of motion. Neck supple. No tracheal deviation present. No thyromegaly present.  Cardiovascular: Normal rate and regular rhythm.   Pulmonary/Chest: Effort normal and breath sounds normal.  Abdominal: Soft. Bowel sounds are normal. There is no tenderness. There is no guarding.  Musculoskeletal: Normal range of motion. She exhibits no edema.  Neurological: She is alert and oriented to person, place, and time. She has normal reflexes. No cranial nerve deficit. Coordination normal.  Skin: Skin is warm and dry. No rash noted. No erythema. No pallor.  Psychiatric: She has a normal mood and affect. Judgment normal.    Results for orders placed or performed in visit on 07/17/15  Basic metabolic panel  Result Value Ref Range    Sodium 138 135 - 146 mmol/L   Potassium 4.4 3.5 - 5.3 mmol/L   Chloride 104 98 - 110 mmol/L   CO2 26 20 - 31 mmol/L   Glucose, Bld 144 (H) 65 - 99 mg/dL   BUN 14 7 - 25 mg/dL   Creat 1.61 0.96 - 0.45 mg/dL   Calcium 8.9 8.6 - 40.9 mg/dL  Hemoglobin W1X  Result Value Ref Range   Hgb A1c MFr Bld 9.8 (H) <5.7 %   Mean Plasma Glucose 235 (H) <117 mg/dL   Complete Blood Count (Most recent): No results found for: WBC, HGB, HCT, MCV, PLT Chemistry (most recent): Lab Results  Component Value Date   NA 138 07/17/2015   K 4.4 07/17/2015   CL 104 07/17/2015   CO2 26 07/17/2015   BUN 14 07/17/2015   CREATININE 0.81 07/17/2015   Diabetic Labs (most recent): Lab Results  Component Value Date   HGBA1C 9.8* 07/17/2015   HGBA1C 8.8* 04/16/2015   HGBA1C 11.0* 01/29/2015     Assessment & Plan:   1. Type 2 diabetes mellitus with vascular disease (HCC)  -Her diabetes is  complicated by coronary artery disease and patient remains at a high risk for more acute and chronic complications of diabetes which include CAD, CVA, CKD, retinopathy, and neuropathy. These are all discussed in detail with  the patient.  Patient came with worsening A1c of 9.8% from 8.8 %, mainly due to inconsistency in her medications.   - I have re-counseled the patient on diet management and weight loss  by adopting a carbohydrate restricted / protein rich  Diet.  - Suggestion is made for patient to avoid simple carbohydrates   from their diet including Cakes , Desserts, Ice Cream,  Soda (  diet and regular) , Sweet Tea , Candies,  Chips, Cookies, Artificial Sweeteners,   and "Sugar-free" Products .  This will help patient to have stable blood glucose profile and potentially avoid unintended  Weight gain.  - Patient is advised to stick to a routine mealtimes to eat 3 meals  a day and avoid unnecessary snacks ( to snack only to correct hypoglycemia).  - I have approached patient with the following individualized plan to  manage diabetes and patient agrees.  - I will decrease Toujeo to 24 units QHS, associated with strict monitoring of glucose   Before breakfast and at bedtime. -She will not need  prandial insulin based on her readings  for now.    -Patient is encouraged to call clinic for blood glucose levels less than 70 or above 200 mg /dl. - I advised her team to be more consistent and resume Invokana 100 mg by mouth every morning, therapeutically suitable for patient. -She does not tolerate metformin. -I will continue Trulicity 0.75 mg weekly.  - Patient specific target  for A1c; LDL, HDL, Triglycerides, and  Waist Circumference were discussed in detail.  2) BP/HTN: Improved.  I advised her to  continue fosinopril t5 mg by mouth every morning  along with her metoprolol 25 mg by mouth every morning.  3) Lipids/HPL:  continue statins. 4)  Weight/Diet: CDE consult in progress, exercise, and carbohydrates information provided.  5) Vitamin D deficiency -New diagnosis. I will initiate vitamin D 50,000 units by mouth weekly for the next 12 weeks.  5) Chronic Care/Health Maintenance:  -Patient  on ACEI/ARB and Statin medications and encouraged to continue to follow up with Ophthalmology, Podiatrist at least yearly or according to recommendations, and advised to  stay away from smoking. I have recommended yearly flu vaccine and pneumonia vaccination at least every 5 years; moderate intensity exercise for up to 150 minutes weekly; and  sleep for at least 7 hours a day.  I advised patient to maintain close follow up with their PCP for primary care needs.  Patient is asked to bring meter and  blood glucose logs during their next visit.   Follow up plan: Return in about 11 weeks (around 10/28/2015) for diabetes, high blood pressure, high cholesterol, follow up with pre-visit labs, meter, and logs.  Marquis Lunch, MD Phone: [phone]  Fax: [phone]   08/12/2015, 4:08 PM

## 2015-08-12 NOTE — Patient Instructions (Signed)

## 2015-08-25 ENCOUNTER — Other Ambulatory Visit: Payer: Self-pay | Admitting: "Endocrinology

## 2015-10-27 ENCOUNTER — Other Ambulatory Visit: Payer: Self-pay | Admitting: "Endocrinology

## 2015-10-27 LAB — BASIC METABOLIC PANEL
BUN: 17 mg/dL (ref 7–25)
CALCIUM: 8.9 mg/dL (ref 8.6–10.4)
CHLORIDE: 107 mmol/L (ref 98–110)
CO2: 23 mmol/L (ref 20–31)
CREATININE: 0.76 mg/dL (ref 0.50–1.05)
GLUCOSE: 186 mg/dL — AB (ref 65–99)
Potassium: 4.4 mmol/L (ref 3.5–5.3)
Sodium: 140 mmol/L (ref 135–146)

## 2015-10-27 LAB — HEMOGLOBIN A1C
Hgb A1c MFr Bld: 9.3 % — ABNORMAL HIGH (ref <5.7)
Mean Plasma Glucose: 220 mg/dL

## 2015-11-02 ENCOUNTER — Ambulatory Visit (INDEPENDENT_AMBULATORY_CARE_PROVIDER_SITE_OTHER): Payer: BLUE CROSS/BLUE SHIELD | Admitting: "Endocrinology

## 2015-11-02 ENCOUNTER — Encounter: Payer: Self-pay | Admitting: "Endocrinology

## 2015-11-02 VITALS — BP 116/72 | HR 76 | Ht 61.0 in | Wt 167.0 lb

## 2015-11-02 DIAGNOSIS — E559 Vitamin D deficiency, unspecified: Secondary | ICD-10-CM | POA: Diagnosis not present

## 2015-11-02 DIAGNOSIS — E1159 Type 2 diabetes mellitus with other circulatory complications: Secondary | ICD-10-CM

## 2015-11-02 DIAGNOSIS — E785 Hyperlipidemia, unspecified: Secondary | ICD-10-CM

## 2015-11-02 DIAGNOSIS — I10 Essential (primary) hypertension: Secondary | ICD-10-CM

## 2015-11-02 MED ORDER — INSULIN GLARGINE 300 UNIT/ML ~~LOC~~ SOPN: 30.0000 [IU] | PEN_INJECTOR | Freq: Every day | SUBCUTANEOUS | Status: DC

## 2015-11-02 MED ORDER — DULAGLUTIDE 1.5 MG/0.5ML ~~LOC~~ SOAJ: 1.5000 mg | SUBCUTANEOUS | Status: DC

## 2015-11-02 NOTE — Progress Notes (Signed)
Subjective:    Patient ID: Cassie Cordova, female    DOB: [birth year],    Past Medical History  Diagnosis Date  . Hypertension   . Diabetes mellitus, type II (HCC)   . CAD (coronary artery disease)    Past Surgical History  Procedure Laterality Date  . Cholecystectomy    . Abdominal hysterectomy    . Cesarean section     Social History   Social History  . Marital Status: Married    Spouse Name: N/A  . Number of Children: N/A  . Years of Education: N/A   Social History Main Topics  . Smoking status: Never Smoker   . Smokeless tobacco: None  . Alcohol Use: No  . Drug Use: No  . Sexual Activity: Not Asked   Other Topics Concern  . None   Social History Narrative   Outpatient Encounter Prescriptions as of 11/02/2015  Medication Sig  . aspirin EC 81 MG tablet Take 81 mg by mouth daily.  Marland Kitchen atorvastatin (LIPITOR) 20 MG tablet Take 20 mg by mouth daily.  Marland Kitchen co-enzyme Q-10 30 MG capsule Take 30 mg by mouth daily.  . Dulaglutide (TRULICITY) 1.5 MG/0.5ML SOPN Inject 1.5 mg into the skin once a week.  . Insulin Glargine (TOUJEO SOLOSTAR) 300 UNIT/ML SOPN Inject 30 Units into the skin at bedtime.  . INVOKANA 100 MG TABS tablet TAKE 1 TABLET BY MOUTH ONCE DAILY.  Marland Kitchen lisinopril (PRINIVIL,ZESTRIL) 5 MG tablet Take 1 tablet (5 mg total) by mouth daily.  . metoprolol succinate (TOPROL-XL) 25 MG 24 hr tablet Take 25 mg by mouth daily.  . nitroGLYCERIN (NITRODUR - DOSED IN MG/24 HR) 0.4 mg/hr patch Place 0.4 mg onto the skin daily.  Marland Kitchen venlafaxine (EFFEXOR) 75 MG tablet Take 75 mg by mouth daily.   . [DISCONTINUED] TOUJEO SOLOSTAR 300 UNIT/ML SOPN INJECT 30 UNITS S.Q. ONCE DAILY AT BEDTIME.  . [DISCONTINUED] TRULICITY 0.75 MG/0.5ML SOPN INJECT 0.75MG  UNDER THE SKIN ONCE WEEKLY   No facility-administered encounter medications on file as of 11/02/2015.   ALLERGIES: No Known Allergies VACCINATION STATUS:  There is no immunization history on file for this patient.  Diabetes She  presents for her follow-up diabetic visit. She has type 2 diabetes mellitus. Onset time: She was diagnosed at approximate age of 47 years. Her disease course has been improving. There are no hypoglycemic associated symptoms. Pertinent negatives for hypoglycemia include no confusion, headaches, pallor or seizures. Associated symptoms include fatigue. Pertinent negatives for diabetes include no chest pain, no polydipsia, no polyphagia and no polyuria. There are no hypoglycemic complications. Symptoms are improving. Diabetic complications include heart disease. Risk factors for coronary artery disease include diabetes mellitus, dyslipidemia, hypertension and sedentary lifestyle. Current diabetic treatment includes insulin injections and oral agent (monotherapy). She is compliant with treatment some of the time (She admits inconsistency in taking her invokana and Trulicity. She also missed some doses of Toujeo as well.). Her weight is stable. She is following a generally unhealthy diet. When asked about meal planning, she reported none. She has had a previous visit with a dietitian. She rarely participates in exercise. Home blood sugar record trend: She did not  bring her log, nor logs. An ACE inhibitor/angiotensin II receptor blocker is being taken. Eye exam is current.  Hypertension This is a chronic problem. The problem is uncontrolled. Pertinent negatives include no chest pain, headaches, palpitations or shortness of breath. Risk factors for coronary artery disease include dyslipidemia, diabetes mellitus and sedentary lifestyle.  Past treatments include ACE inhibitors. The current treatment provides no improvement.  Hyperlipidemia This is a chronic problem. The current episode started more than 1 year ago. The problem is controlled. Recent lipid tests were reviewed and are normal. Pertinent negatives include no chest pain, myalgias or shortness of breath. Current antihyperlipidemic treatment includes statins.  Risk factors for coronary artery disease include dyslipidemia, diabetes mellitus, hypertension and obesity.     Review of Systems  Constitutional: Positive for fatigue. Negative for unexpected weight change.  HENT: Negative for trouble swallowing and voice change.   Eyes: Negative for visual disturbance.  Respiratory: Negative for cough, shortness of breath and wheezing.   Cardiovascular: Negative for chest pain, palpitations and leg swelling.  Gastrointestinal: Negative for nausea, vomiting and diarrhea.  Endocrine: Negative for cold intolerance, heat intolerance, polydipsia, polyphagia and polyuria.  Musculoskeletal: Negative for myalgias and arthralgias.  Skin: Negative for color change, pallor, rash and wound.  Neurological: Negative for seizures and headaches.  Psychiatric/Behavioral: Negative for suicidal ideas and confusion.    Objective:    BP 116/72 mmHg  Pulse 76  Ht 5\' 1"  (1.549 m)  Wt 167 lb (75.751 kg)  BMI 31.57 kg/m2  SpO2 98%  Wt Readings from Last 3 Encounters:  11/02/15 167 lb (75.751 kg)  08/12/15 168 lb (76.204 kg)  08/05/15 168 lb (76.204 kg)    Physical Exam  Constitutional: She is oriented to person, place, and time. She appears well-developed.  HENT:  Head: Normocephalic and atraumatic.  Eyes: EOM are normal.  Neck: Normal range of motion. Neck supple. No tracheal deviation present. No thyromegaly present.  Cardiovascular: Normal rate and regular rhythm.   Pulmonary/Chest: Effort normal and breath sounds normal.  Abdominal: Soft. Bowel sounds are normal. There is no tenderness. There is no guarding.  Musculoskeletal: Normal range of motion. She exhibits no edema.  Neurological: She is alert and oriented to person, place, and time. She has normal reflexes. No cranial nerve deficit. Coordination normal.  Skin: Skin is warm and dry. No rash noted. No erythema. No pallor.  Psychiatric: She has a normal mood and affect. Judgment normal.    Results for  orders placed or performed in visit on 10/27/15  Basic metabolic panel  Result Value Ref Range   Sodium 140 135 - 146 mmol/L   Potassium 4.4 3.5 - 5.3 mmol/L   Chloride 107 98 - 110 mmol/L   CO2 23 20 - 31 mmol/L   Glucose, Bld 186 (H) 65 - 99 mg/dL   BUN 17 7 - 25 mg/dL   Creat 9.600.76 4.540.50 - 0.981.05 mg/dL   Calcium 8.9 8.6 - 11.910.4 mg/dL  Hemoglobin J4NA1c  Result Value Ref Range   Hgb A1c MFr Bld 9.3 (H) <5.7 %   Mean Plasma Glucose 220 mg/dL   Complete Blood Count (Most recent): No results found for: WBC, HGB, HCT, MCV, PLT Chemistry (most recent): Lab Results  Component Value Date   NA 140 10/27/2015   K 4.4 10/27/2015   CL 107 10/27/2015   CO2 23 10/27/2015   BUN 17 10/27/2015   CREATININE 0.76 10/27/2015   Diabetic Labs (most recent): Lab Results  Component Value Date   HGBA1C 9.3* 10/27/2015   HGBA1C 9.8* 07/17/2015   HGBA1C 8.8* 04/16/2015     Assessment & Plan:   1. Type 2 diabetes mellitus with vascular disease (HCC)  -Her diabetes is  complicated by coronary artery disease and patient remains at a high risk for more acute and  chronic complications of diabetes which include CAD, CVA, CKD, retinopathy, and neuropathy. These are all discussed in detail with the patient.  Patient came with  Slightly improving A1c of 9.3 are sent from  9.8% , mainly due to inconsistency in her medications.   - I have re-counseled the patient on diet management and weight loss  by adopting a carbohydrate restricted / protein rich  Diet.  - Suggestion is made for patient to avoid simple carbohydrates   from their diet including Cakes , Desserts, Ice Cream,  Soda (  diet and regular) , Sweet Tea , Candies,  Chips, Cookies, Artificial Sweeteners,   and "Sugar-free" Products .  This will help patient to have stable blood glucose profile and potentially avoid unintended  Weight gain.  - Patient is advised to stick to a routine mealtimes to eat 3 meals  a day and avoid unnecessary snacks ( to  snack only to correct hypoglycemia).  - I have approached patient with the following individualized plan to manage diabetes and patient agrees.  - I will decrease Toujeo to 30 units QHS, associated with strict monitoring of glucose   Before breakfast and at bedtime. -She may need  need  prandial insulin based on her readings.    -Patient is encouraged to call clinic for blood glucose levels less than 70 or above 200 mg /dl. - I advised her team to be more consistent and resume Invokana 100 mg by mouth every morning, therapeutically suitable for patient. -She does not tolerate metformin. -I will increase Trulicity to 1.5 mg weekly.   - Patient specific target  for A1c; LDL, HDL, Triglycerides, and  Waist Circumference were discussed in detail.  2) BP/HTN: Improved.  I advised her to  continue fosinopril t5 mg by mouth every morning  along with her metoprolol 25 mg by mouth every morning.  3) Lipids/HPL:  continue statins. 4)  Weight/Diet: CDE consult in progress, exercise, and carbohydrates information provided.  5) Vitamin D deficiency She is status post therapy with vitamin D  50,000 units by mouth weekly for the next 12 weeks.  5) Chronic Care/Health Maintenance:  -Patient  on ACEI/ARB and Statin medications and encouraged to continue to follow up with Ophthalmology, Podiatrist at least yearly or according to recommendations, and advised to  stay away from smoking. I have recommended yearly flu vaccine and pneumonia vaccination at least every 5 years; moderate intensity exercise for up to 150 minutes weekly; and  sleep for at least 7 hours a day.  I advised patient to maintain close follow up with their PCP for primary care needs.  Patient is asked to bring meter and  blood glucose logs during their next visit.   Follow up plan: Return in about 2 weeks (around 11/16/2015) for diabetes, high blood pressure, high cholesterol, follow up with meter and logs- no labs.  Marquis Lunch,  MD Phone: [phone]  Fax: [phone]   11/02/2015, 4:53 PM

## 2015-11-02 NOTE — Patient Instructions (Signed)

## 2015-11-19 ENCOUNTER — Ambulatory Visit: Payer: BLUE CROSS/BLUE SHIELD | Admitting: "Endocrinology

## 2015-12-10 ENCOUNTER — Other Ambulatory Visit: Payer: Self-pay | Admitting: "Endocrinology

## 2015-12-18 ENCOUNTER — Other Ambulatory Visit: Payer: Self-pay | Admitting: "Endocrinology

## 2016-02-09 ENCOUNTER — Other Ambulatory Visit: Payer: Self-pay | Admitting: "Endocrinology

## 2016-03-10 ENCOUNTER — Other Ambulatory Visit: Payer: Self-pay | Admitting: "Endocrinology

## 2016-03-23 ENCOUNTER — Other Ambulatory Visit: Payer: Self-pay | Admitting: "Endocrinology

## 2016-03-23 DIAGNOSIS — E118 Type 2 diabetes mellitus with unspecified complications: Principal | ICD-10-CM

## 2016-03-23 DIAGNOSIS — E1165 Type 2 diabetes mellitus with hyperglycemia: Secondary | ICD-10-CM

## 2016-03-28 LAB — BASIC METABOLIC PANEL
BUN: 12 mg/dL (ref 7–25)
CALCIUM: 8.9 mg/dL (ref 8.6–10.4)
CO2: 24 mmol/L (ref 20–31)
CREATININE: 0.77 mg/dL (ref 0.50–1.05)
Chloride: 103 mmol/L (ref 98–110)
GLUCOSE: 357 mg/dL — AB (ref 65–99)
Potassium: 4.7 mmol/L (ref 3.5–5.3)
Sodium: 138 mmol/L (ref 135–146)

## 2016-03-29 LAB — HEMOGLOBIN A1C
Hgb A1c MFr Bld: 10.5 % — ABNORMAL HIGH (ref <5.7)
Mean Plasma Glucose: 255 mg/dL

## 2016-04-04 ENCOUNTER — Encounter: Payer: Self-pay | Admitting: "Endocrinology

## 2016-04-04 ENCOUNTER — Ambulatory Visit (INDEPENDENT_AMBULATORY_CARE_PROVIDER_SITE_OTHER): Payer: BLUE CROSS/BLUE SHIELD | Admitting: "Endocrinology

## 2016-04-04 VITALS — BP 159/76 | HR 67 | Ht 61.0 in | Wt 173.0 lb

## 2016-04-04 DIAGNOSIS — I10 Essential (primary) hypertension: Secondary | ICD-10-CM | POA: Diagnosis not present

## 2016-04-04 DIAGNOSIS — E782 Mixed hyperlipidemia: Secondary | ICD-10-CM

## 2016-04-04 DIAGNOSIS — Z91199 Patient's noncompliance with other medical treatment and regimen due to unspecified reason: Secondary | ICD-10-CM

## 2016-04-04 DIAGNOSIS — Z9119 Patient's noncompliance with other medical treatment and regimen: Secondary | ICD-10-CM | POA: Diagnosis not present

## 2016-04-04 DIAGNOSIS — E1159 Type 2 diabetes mellitus with other circulatory complications: Secondary | ICD-10-CM

## 2016-04-04 MED ORDER — INSULIN GLARGINE 300 UNIT/ML ~~LOC~~ SOPN: 40.0000 [IU] | PEN_INJECTOR | Freq: Every day | SUBCUTANEOUS | 1 refills | Status: DC

## 2016-04-04 MED ORDER — FREESTYLE LITE DEVI: 99 refills | Status: DC

## 2016-04-04 MED ORDER — INSULIN ASPART 100 UNIT/ML FLEXPEN: 10.0000 [IU] | PEN_INJECTOR | Freq: Three times a day (TID) | SUBCUTANEOUS | 2 refills | Status: DC

## 2016-04-04 MED ORDER — EMPAGLIFLOZIN 10 MG PO TABS: 10.0000 mg | ORAL_TABLET | Freq: Every day | ORAL | 2 refills | Status: DC

## 2016-04-04 MED ORDER — FREESTYLE LANCETS MISC: 3 refills | Status: AC

## 2016-04-04 NOTE — Patient Instructions (Signed)

## 2016-04-04 NOTE — Progress Notes (Signed)
Subjective:    Patient ID: Cassie Cordova, female    DOB: 1955/08/13,    Past Medical History:  Diagnosis Date  . CAD (coronary artery disease)   . Diabetes mellitus, type II (HCC)   . Hypertension    Past Surgical History:  Procedure Laterality Date  . ABDOMINAL HYSTERECTOMY    . CESAREAN SECTION    . CHOLECYSTECTOMY     Social History   Social History  . Marital status: Married    Spouse name: N/A  . Number of children: N/A  . Years of education: N/A   Social History Main Topics  . Smoking status: Never Smoker  . Smokeless tobacco: Never Used  . Alcohol use No  . Drug use: No  . Sexual activity: Not Asked   Other Topics Concern  . None   Social History Narrative  . None   Outpatient Encounter Prescriptions as of 04/04/2016  Medication Sig  . aspirin EC 81 MG tablet Take 81 mg by mouth daily.  Marland Kitchen. atorvastatin (LIPITOR) 20 MG tablet Take 20 mg by mouth daily.  . Blood Glucose Monitoring Suppl (FREESTYLE LITE) DEVI 1 to test glucose 4 times a day  . co-enzyme Q-10 30 MG capsule Take 30 mg by mouth daily.  . empagliflozin (JARDIANCE) 10 MG TABS tablet Take 10 mg by mouth daily.  . insulin aspart (NOVOLOG FLEXPEN) 100 UNIT/ML FlexPen Inject 10-16 Units into the skin 3 (three) times daily with meals.  . Insulin Glargine (TOUJEO SOLOSTAR) 300 UNIT/ML SOPN Inject 40 Units into the skin at bedtime.  . Lancets (FREESTYLE) lancets Use as instructed  . lisinopril (PRINIVIL,ZESTRIL) 5 MG tablet TAKE 1 BY MOUTH DAILY  . metoprolol succinate (TOPROL-XL) 25 MG 24 hr tablet Take 25 mg by mouth daily.  . nitroGLYCERIN (NITRODUR - DOSED IN MG/24 HR) 0.4 mg/hr patch Place 0.4 mg onto the skin daily.  Marland Kitchen. venlafaxine (EFFEXOR) 75 MG tablet Take 75 mg by mouth daily.   . [DISCONTINUED] Dulaglutide (TRULICITY) 1.5 MG/0.5ML SOPN Inject 1.5 mg into the skin once a week. (Patient not taking: Reported on 04/04/2016)  . [DISCONTINUED] Insulin Glargine (TOUJEO SOLOSTAR) 300 UNIT/ML  SOPN Inject 30 Units into the skin at bedtime.  . [DISCONTINUED] INVOKANA 100 MG TABS tablet TAKE 1 TABLET BY MOUTH ONCE DAILY. (Patient not taking: Reported on 04/04/2016)   No facility-administered encounter medications on file as of 04/04/2016.    ALLERGIES: No Known Allergies VACCINATION STATUS:  There is no immunization history on file for this patient.  Diabetes  She presents for her follow-up diabetic visit. She has type 2 diabetes mellitus. Onset time: She was diagnosed at approximate age of 47 years. Her disease course has been improving. There are no hypoglycemic associated symptoms. Pertinent negatives for hypoglycemia include no confusion, headaches, pallor or seizures. Associated symptoms include fatigue. Pertinent negatives for diabetes include no chest pain, no polydipsia, no polyphagia and no polyuria. There are no hypoglycemic complications. Symptoms are improving. Diabetic complications include heart disease. Risk factors for coronary artery disease include diabetes mellitus, dyslipidemia, hypertension and sedentary lifestyle. Current diabetic treatment includes insulin injections and oral agent (monotherapy). She is compliant with treatment some of the time (She admits inconsistency in taking her invokana and Trulicity. She also missed some doses of Toujeo as well.). Her weight is increasing steadily. She is following a generally unhealthy diet. When asked about meal planning, she reported none. She has had a previous visit with a dietitian. She rarely participates in  exercise. Her overall blood glucose range is >200 mg/dl. An ACE inhibitor/angiotensin II receptor blocker is being taken. Eye exam is current.  Hypertension  This is a chronic problem. The problem is uncontrolled. Pertinent negatives include no chest pain, headaches, palpitations or shortness of breath. Risk factors for coronary artery disease include dyslipidemia, diabetes mellitus and sedentary lifestyle. Past  treatments include ACE inhibitors. The current treatment provides no improvement.  Hyperlipidemia  This is a chronic problem. The current episode started more than 1 year ago. The problem is controlled. Recent lipid tests were reviewed and are normal. Pertinent negatives include no chest pain, myalgias or shortness of breath. Current antihyperlipidemic treatment includes statins. Risk factors for coronary artery disease include dyslipidemia, diabetes mellitus, hypertension and obesity.     Review of Systems  Constitutional: Positive for fatigue. Negative for unexpected weight change.  HENT: Negative for trouble swallowing and voice change.   Eyes: Negative for visual disturbance.  Respiratory: Negative for cough, shortness of breath and wheezing.   Cardiovascular: Negative for chest pain, palpitations and leg swelling.  Gastrointestinal: Negative for diarrhea, nausea and vomiting.  Endocrine: Negative for cold intolerance, heat intolerance, polydipsia, polyphagia and polyuria.  Musculoskeletal: Negative for arthralgias and myalgias.  Skin: Negative for color change, pallor, rash and wound.  Neurological: Negative for seizures and headaches.  Psychiatric/Behavioral: Negative for confusion and suicidal ideas.    Objective:    BP (!) 159/76   Pulse 67   Ht 5\' 1"  (1.549 m)   Wt 173 lb (78.5 kg)   BMI 32.69 kg/m   Wt Readings from Last 3 Encounters:  04/04/16 173 lb (78.5 kg)  11/02/15 167 lb (75.8 kg)  08/12/15 168 lb (76.2 kg)    Physical Exam  Constitutional: She is oriented to person, place, and time. She appears well-developed.  HENT:  Head: Normocephalic and atraumatic.  Eyes: EOM are normal.  Neck: Normal range of motion. Neck supple. No tracheal deviation present. No thyromegaly present.  Cardiovascular: Normal rate and regular rhythm.   Pulmonary/Chest: Effort normal and breath sounds normal.  Abdominal: Soft. Bowel sounds are normal. There is no tenderness. There is no  guarding.  Musculoskeletal: Normal range of motion. She exhibits no edema.  Neurological: She is alert and oriented to person, place, and time. She has normal reflexes. No cranial nerve deficit. Coordination normal.  Skin: Skin is warm and dry. No rash noted. No erythema. No pallor.  Psychiatric: She has a normal mood and affect. Judgment normal.    Results for orders placed or performed in visit on 03/23/16  Hemoglobin A1c  Result Value Ref Range   Hgb A1c MFr Bld 10.5 (H) <5.7 %   Mean Plasma Glucose 255 mg/dL  Basic Metabolic Panel  Result Value Ref Range   Sodium 138 135 - 146 mmol/L   Potassium 4.7 3.5 - 5.3 mmol/L   Chloride 103 98 - 110 mmol/L   CO2 24 20 - 31 mmol/L   Glucose, Bld 357 (H) 65 - 99 mg/dL   BUN 12 7 - 25 mg/dL   Creat 4.09 8.11 - 9.14 mg/dL   Calcium 8.9 8.6 - 78.2 mg/dL   Complete Blood Count (Most recent): No results found for: WBC, HGB, HCT, MCV, PLT Chemistry (most recent): Lab Results  Component Value Date   NA 138 03/28/2016   K 4.7 03/28/2016   CL 103 03/28/2016   CO2 24 03/28/2016   BUN 12 03/28/2016   CREATININE 0.77 03/28/2016   Diabetic Labs (  most recent): Lab Results  Component Value Date   HGBA1C 10.5 (H) 03/28/2016   HGBA1C 9.3 (H) 10/27/2015   HGBA1C 9.8 (H) 07/17/2015     Assessment & Plan:   1. Type 2 diabetes mellitus with vascular disease (HCC)  -Her diabetes is  complicated by coronary artery disease and patient remains at a high risk for more acute and chronic complications of diabetes which include CAD, CVA, CKD, retinopathy, and neuropathy. These are all discussed in detail with the patient.  Patient came  After a long absence from clinic with loss of control, A1c 10.5%. She brought a meter with inadequate monitoring activities.     - I have re-counseled the patient on diet management and weight loss  by adopting a carbohydrate restricted / protein rich  Diet.  - Suggestion is made for patient to avoid simple  carbohydrates   from their diet including Cakes , Desserts, Ice Cream,  Soda (  diet and regular) , Sweet Tea , Candies,  Chips, Cookies, Artificial Sweeteners,   and "Sugar-free" Products .  This will help patient to have stable blood glucose profile and potentially avoid unintended  Weight gain.  - Patient is advised to stick to a routine mealtimes to eat 3 meals  a day and avoid unnecessary snacks ( to snack only to correct hypoglycemia).  - I have approached patient with the following individualized plan to manage diabetes and patient agrees.  - I had a long discussion with patient regarding the need for more intense treatment, she reluctantly accepts. - I will increase Toujeo to 40 units QHS, initiate prandial insulin with NovoLog 10 units 3 times a day before meals for pre-meal blood glucose above 90 mg/dL plus patient specific correction dose, associated with strict monitoring of glucose   Before breakfast and at bedtime.  -Patient is encouraged to call clinic for blood glucose levels less than 70 or above 200 mg /dl. - I will discontinue Invokana for cost reasons. -She does not tolerate metformin. -I will discontinue Trulicity . - I will change her  SGLT2i, to Jardiance 10 mg by mouth every morning. Side effects and precautions discussed with her.   - Patient specific target  for A1c; LDL, HDL, Triglycerides, and  Waist Circumference were discussed in detail.  2) BP/HTN: unImproved.  I advised her to  continue fosinopril 5 mg by mouth every morning  along with her metoprolol 25 mg by mouth every morning.   She may need increased dose of fosinopril if blood pressure remains above target. 3) Lipids/HPL:  continue statins. 4)  Weight/Diet: CDE consult in progress, exercise, and carbohydrates information provided.  5) Vitamin D deficiency She is status post therapy with vitamin D  50,000 units by mouth weekly for the next 12 weeks.  5) Chronic Care/Health Maintenance:  -Patient  on  ACEI/ARB and Statin medications and encouraged to continue to follow up with Ophthalmology, Podiatrist at least yearly or according to recommendations, and advised to  stay away from smoking. I have recommended yearly flu vaccine and pneumonia vaccination at least every 5 years; moderate intensity exercise for up to 150 minutes weekly; and  sleep for at least 7 hours a day.  I advised patient to maintain close follow up with their PCP for primary care needs.  Patient is asked to bring meter and  blood glucose logs during their next visit.   Follow up plan: Return in about 2 weeks (around 04/18/2016) for follow up with meter and logs-  no labs.  Marquis Lunch, MD Phone: [phone]  Fax: [phone]   04/04/2016, 4:33 PM

## 2016-04-06 ENCOUNTER — Other Ambulatory Visit: Payer: Self-pay

## 2016-04-06 MED ORDER — BLOOD GLUCOSE MONITOR KIT: PACK | 0 refills | Status: DC

## 2016-04-18 ENCOUNTER — Ambulatory Visit (INDEPENDENT_AMBULATORY_CARE_PROVIDER_SITE_OTHER): Payer: BLUE CROSS/BLUE SHIELD | Admitting: "Endocrinology

## 2016-04-18 ENCOUNTER — Encounter: Payer: Self-pay | Admitting: "Endocrinology

## 2016-04-18 VITALS — BP 146/78 | HR 65 | Ht 61.0 in | Wt 173.0 lb

## 2016-04-18 DIAGNOSIS — E782 Mixed hyperlipidemia: Secondary | ICD-10-CM | POA: Diagnosis not present

## 2016-04-18 DIAGNOSIS — E1159 Type 2 diabetes mellitus with other circulatory complications: Secondary | ICD-10-CM

## 2016-04-18 DIAGNOSIS — I10 Essential (primary) hypertension: Secondary | ICD-10-CM

## 2016-04-18 MED ORDER — INSULIN LISPRO 100 UNIT/ML CARTRIDGE: 8.0000 [IU] | Freq: Three times a day (TID) | SUBCUTANEOUS | 2 refills | Status: DC

## 2016-04-18 MED ORDER — INSULIN SYRINGE 29G X 1/2" 0.5 ML MISC
3 refills | Status: DC
Start: 2016-04-18 — End: 2016-05-20

## 2016-04-18 MED ORDER — METFORMIN HCL 500 MG PO TABS: 500.0000 mg | ORAL_TABLET | Freq: Two times a day (BID) | ORAL | 3 refills | Status: DC

## 2016-04-18 NOTE — Patient Instructions (Signed)

## 2016-04-18 NOTE — Progress Notes (Signed)
Subjective:    Patient ID: Cassie Cordova, female    DOB: 1955-11-03,    Past Medical History:  Diagnosis Date  . CAD (coronary artery disease)   . Diabetes mellitus, type II ([place])   . Hypertension    Past Surgical History:  Procedure Laterality Date  . ABDOMINAL HYSTERECTOMY    . CESAREAN SECTION    . CHOLECYSTECTOMY     Social History   Social History  . Marital status: Married    Spouse name: N/A  . Number of children: N/A  . Years of education: N/A   Social History Main Topics  . Smoking status: Never Smoker  . Smokeless tobacco: Never Used  . Alcohol use No  . Drug use: No  . Sexual activity: Not Asked   Other Topics Concern  . None   Social History Narrative  . None   Outpatient Encounter Prescriptions as of 04/18/2016  Medication Sig  . [DISCONTINUED] insulin lispro (HUMALOG) 100 UNIT/ML KiwkPen Inject 8-14 Units into the skin 3 (three) times daily with meals.  Marland Kitchen aspirin EC 81 MG tablet Take 81 mg by mouth daily.  Marland Kitchen atorvastatin (LIPITOR) 20 MG tablet Take 20 mg by mouth daily.  . blood glucose meter kit and supplies KIT Dispense based on patient and insurance preference. Use up to four times daily as directed. (FOR ICD-10 E11.65)  . Blood Glucose Monitoring Suppl (FREESTYLE LITE) DEVI 1 to test glucose 4 times a day  . co-enzyme Q-10 30 MG capsule Take 30 mg by mouth daily.  . empagliflozin (JARDIANCE) 10 MG TABS tablet Take 10 mg by mouth daily.  . Insulin Glargine (TOUJEO SOLOSTAR) 300 UNIT/ML SOPN Inject 40 Units into the skin at bedtime. (Patient taking differently: Inject 30 Units into the skin at bedtime. )  . insulin lispro (HUMALOG) 100 UNIT/ML cartridge Inject 0.08-0.14 mLs (8-14 Units total) into the skin 3 (three) times daily with meals.  . INSULIN SYRINGE .5CC/29G 29G X 1/2" 0.5 ML MISC Use 3 times to inject insulin  . Lancets (FREESTYLE) lancets Use as instructed  . lisinopril (PRINIVIL,ZESTRIL) 5 MG tablet TAKE 1 BY MOUTH DAILY  .  metFORMIN (GLUCOPHAGE) 500 MG tablet Take 1 tablet (500 mg total) by mouth 2 (two) times daily with a meal.  . metoprolol succinate (TOPROL-XL) 25 MG 24 hr tablet Take 25 mg by mouth daily.  . nitroGLYCERIN (NITRODUR - DOSED IN MG/24 HR) 0.4 mg/hr patch Place 0.4 mg onto the skin daily.  Marland Kitchen venlafaxine (EFFEXOR) 75 MG tablet Take 75 mg by mouth daily.   . [DISCONTINUED] insulin aspart (NOVOLOG FLEXPEN) 100 UNIT/ML FlexPen Inject 10-16 Units into the skin 3 (three) times daily with meals.   No facility-administered encounter medications on file as of 04/18/2016.    ALLERGIES: No Known Allergies VACCINATION STATUS:  There is no immunization history on file for this patient.  Diabetes  She presents for her follow-up diabetic visit. She has type 2 diabetes mellitus. Onset time: She was diagnosed at approximate age of 23 years. Her disease course has been improving. There are no hypoglycemic associated symptoms. Pertinent negatives for hypoglycemia include no confusion, headaches, pallor or seizures. Associated symptoms include fatigue. Pertinent negatives for diabetes include no chest pain, no polydipsia, no polyphagia and no polyuria. There are no hypoglycemic complications. Symptoms are improving. Diabetic complications include heart disease. Risk factors for coronary artery disease include diabetes mellitus, dyslipidemia, hypertension and sedentary lifestyle. Current diabetic treatment includes insulin injections and oral agent (  monotherapy). She is compliant with treatment some of the time (She admits inconsistency in taking her invokana and Trulicity. She also missed some doses of Toujeo as well.). Her weight is stable. She is following a generally unhealthy diet. When asked about meal planning, she reported none. She has had a previous visit with a dietitian. She rarely participates in exercise. Her home blood glucose trend is decreasing steadily. Her breakfast blood glucose range is generally  130-140 mg/dl. Her lunch blood glucose range is generally 130-140 mg/dl. Her dinner blood glucose range is generally 130-140 mg/dl. Her overall blood glucose range is 130-140 mg/dl. An ACE inhibitor/angiotensin II receptor blocker is being taken. Eye exam is current.  Hypertension  This is a chronic problem. The problem is uncontrolled. Pertinent negatives include no chest pain, headaches, palpitations or shortness of breath. Risk factors for coronary artery disease include dyslipidemia, diabetes mellitus and sedentary lifestyle. Past treatments include ACE inhibitors. The current treatment provides no improvement.  Hyperlipidemia  This is a chronic problem. The current episode started more than 1 year ago. The problem is controlled. Recent lipid tests were reviewed and are normal. Pertinent negatives include no chest pain, myalgias or shortness of breath. Current antihyperlipidemic treatment includes statins. Risk factors for coronary artery disease include dyslipidemia, diabetes mellitus, hypertension and obesity.     Review of Systems  Constitutional: Positive for fatigue. Negative for unexpected weight change.  HENT: Negative for trouble swallowing and voice change.   Eyes: Negative for visual disturbance.  Respiratory: Negative for cough, shortness of breath and wheezing.   Cardiovascular: Negative for chest pain, palpitations and leg swelling.  Gastrointestinal: Negative for diarrhea, nausea and vomiting.  Endocrine: Negative for cold intolerance, heat intolerance, polydipsia, polyphagia and polyuria.  Musculoskeletal: Negative for arthralgias and myalgias.  Skin: Negative for color change, pallor, rash and wound.  Neurological: Negative for seizures and headaches.  Psychiatric/Behavioral: Negative for confusion and suicidal ideas.    Objective:    BP (!) 146/78   Pulse 65   Ht '5\' 1"'$  (1.549 m)   Wt 173 lb (78.5 kg)   BMI 32.69 kg/m   Wt Readings from Last 3 Encounters:  04/18/16  173 lb (78.5 kg)  04/04/16 173 lb (78.5 kg)  11/02/15 167 lb (75.8 kg)    Physical Exam  Constitutional: She is oriented to person, place, and time. She appears well-developed.  HENT:  Head: Normocephalic and atraumatic.  Eyes: EOM are normal.  Neck: Normal range of motion. Neck supple. No tracheal deviation present. No thyromegaly present.  Cardiovascular: Normal rate and regular rhythm.   Pulmonary/Chest: Effort normal and breath sounds normal.  Abdominal: Soft. Bowel sounds are normal. There is no tenderness. There is no guarding.  Musculoskeletal: Normal range of motion. She exhibits no edema.  Neurological: She is alert and oriented to person, place, and time. She has normal reflexes. No cranial nerve deficit. Coordination normal.  Skin: Skin is warm and dry. No rash noted. No erythema. No pallor.  Psychiatric: She has a normal mood and affect. Judgment normal.    Results for orders placed or performed in visit on 03/23/16  Hemoglobin A1c  Result Value Ref Range   Hgb A1c MFr Bld 10.5 (H) <5.7 %   Mean Plasma Glucose 255 mg/dL  Basic Metabolic Panel  Result Value Ref Range   Sodium 138 135 - 146 mmol/L   Potassium 4.7 3.5 - 5.3 mmol/L   Chloride 103 98 - 110 mmol/L   CO2 24 20 -  31 mmol/L   Glucose, Bld 357 (H) 65 - 99 mg/dL   BUN 12 7 - 25 mg/dL   Creat 0.37 5.04 - 2.37 mg/dL   Calcium 8.9 8.6 - 82.7 mg/dL   Complete Blood Count (Most recent): No results found for: WBC, HGB, HCT, MCV, PLT Chemistry (most recent): Lab Results  Component Value Date   NA 138 03/28/2016   K 4.7 03/28/2016   CL 103 03/28/2016   CO2 24 03/28/2016   BUN 12 03/28/2016   CREATININE 0.77 03/28/2016   Diabetic Labs (most recent): Lab Results  Component Value Date   HGBA1C 10.5 (H) 03/28/2016   HGBA1C 9.3 (H) 10/27/2015   HGBA1C 9.8 (H) 07/17/2015     Assessment & Plan:   1. Type 2 diabetes mellitus with vascular disease (HCC)  -Her diabetes is  complicated by coronary artery  disease and patient remains at a high risk for more acute and chronic complications of diabetes which include CAD, CVA, CKD, retinopathy, and neuropathy. These are all discussed in detail with the patient.  Patient came  After a long absence from clinic with loss of control, A1c 10.5%. She brought a meter with inadequate monitoring activities.     - I have re-counseled the patient on diet management and weight loss  by adopting a carbohydrate restricted / protein rich  Diet.  - Suggestion is made for patient to avoid simple carbohydrates   from their diet including Cakes , Desserts, Ice Cream,  Soda (  diet and regular) , Sweet Tea , Candies,  Chips, Cookies, Artificial Sweeteners,   and "Sugar-free" Products .  This will help patient to have stable blood glucose profile and potentially avoid unintended  Weight gain.  - Patient is advised to stick to a routine mealtimes to eat 3 meals  a day and avoid unnecessary snacks ( to snack only to correct hypoglycemia).  - I have approached patient with the following individualized plan to manage diabetes and patient agrees.  -  She will continue to require basal/bolus insulin to control diabetes. She came with significant improvement in her glycemia with symptomatic improvement. - I will lower Toujeo to 30 units QHS, lower Humalog to 8 units 3 times a day before meals ( she is requesting for Humalog vials, her insurance did not cover for NovoLog and will cost her more for Humalog pens) for pre-meal blood glucose above 90 mg/dL plus patient specific correction dose, associated with strict monitoring of glucose   Before breakfast and at bedtime.  -Patient is encouraged to call clinic for blood glucose levels less than 70 or above 200 mg /dl. - Her insurance is not helpful for SGLT 2 inhibitors, Invokana and Jardiance. - In the past, she does not tolerate metformin. She is willing to try again  low-dose metformin 500 mg after supper to advance if  tolerated.  - Patient specific target  for A1c; LDL, HDL, Triglycerides, and  Waist Circumference were discussed in detail.  2) BP/HTN: Improved.  I advised her to  continue fosinopril 5 mg by mouth every morning  along with her metoprolol 25 mg by mouth every morning.   She may need increased dose of fosinopril if blood pressure remains above target. 3) Lipids/HPL:  continue statins. 4)  Weight/Diet: CDE consult in progress, exercise, and carbohydrates information provided.  5) Vitamin D deficiency She is status post therapy with vitamin D  50,000 units by mouth weekly for the next 12 weeks.  5) Chronic Care/Health  Maintenance:  -Patient  on ACEI/ARB and Statin medications and encouraged to continue to follow up with Ophthalmology, Podiatrist at least yearly or according to recommendations, and advised to  stay away from smoking. I have recommended yearly flu vaccine and pneumonia vaccination at least every 5 years; moderate intensity exercise for up to 150 minutes weekly; and  sleep for at least 7 hours a day.  I advised patient to maintain close follow up with their PCP for primary care needs.  Patient is asked to bring meter and  blood glucose logs during their next visit.   Follow up plan: Return in about 9 weeks (around 06/20/2016) for follow up with pre-visit labs, meter, and logs.  Glade Lloyd, MD Phone: [phone]  Fax: [phone]   04/18/2016, 2:46 PM

## 2016-04-26 ENCOUNTER — Other Ambulatory Visit: Payer: Self-pay | Admitting: "Endocrinology

## 2016-04-26 NOTE — Telephone Encounter (Signed)
Did not see Trulicity on pts instructions.

## 2016-04-26 NOTE — Telephone Encounter (Signed)
This medication was not refilled due to cost, she can stay off of it while taking basal/bolus insulin therapy for diabetes.

## 2016-05-20 ENCOUNTER — Other Ambulatory Visit: Payer: Self-pay

## 2016-05-20 ENCOUNTER — Other Ambulatory Visit: Payer: Self-pay | Admitting: "Endocrinology

## 2016-05-20 MED ORDER — INSULIN ASPART 100 UNIT/ML ~~LOC~~ SOLN: 8.0000 [IU] | Freq: Three times a day (TID) | SUBCUTANEOUS | 2 refills | Status: DC

## 2016-05-20 MED ORDER — INSULIN LISPRO 100 UNIT/ML ~~LOC~~ SOLN: 8.0000 [IU] | Freq: Three times a day (TID) | SUBCUTANEOUS | 2 refills | Status: DC

## 2016-05-20 MED ORDER — "INSULIN SYRINGE 29G X 1/2"" 0.5 ML MISC": 2 refills | Status: AC

## 2016-05-20 NOTE — Telephone Encounter (Signed)
Pt left message at front desk that she needs insulin sent to [facility] in [place]. Rx sent.

## 2016-05-27 ENCOUNTER — Emergency Department (HOSPITAL_COMMUNITY)
Admission: EM | Admit: 2016-05-27 | Discharge: 2016-05-27 | Disposition: A | Discharge status: Home / Self Care | Payer: BLUE CROSS/BLUE SHIELD | Attending: Emergency Medicine | Admitting: Emergency Medicine

## 2016-05-27 ENCOUNTER — Encounter (HOSPITAL_COMMUNITY): Payer: Self-pay | Admitting: Emergency Medicine

## 2016-05-27 ENCOUNTER — Emergency Department (HOSPITAL_COMMUNITY): Payer: BLUE CROSS/BLUE SHIELD

## 2016-05-27 DIAGNOSIS — I10 Essential (primary) hypertension: Secondary | ICD-10-CM | POA: Insufficient documentation

## 2016-05-27 DIAGNOSIS — I2510 Atherosclerotic heart disease of native coronary artery without angina pectoris: Secondary | ICD-10-CM | POA: Insufficient documentation

## 2016-05-27 DIAGNOSIS — M545 Low back pain, unspecified: Secondary | ICD-10-CM

## 2016-05-27 DIAGNOSIS — E119 Type 2 diabetes mellitus without complications: Secondary | ICD-10-CM | POA: Diagnosis not present

## 2016-05-27 DIAGNOSIS — Z7982 Long term (current) use of aspirin: Secondary | ICD-10-CM | POA: Insufficient documentation

## 2016-05-27 DIAGNOSIS — Z79899 Other long term (current) drug therapy: Secondary | ICD-10-CM | POA: Diagnosis not present

## 2016-05-27 DIAGNOSIS — Z794 Long term (current) use of insulin: Secondary | ICD-10-CM | POA: Insufficient documentation

## 2016-05-27 DIAGNOSIS — K5900 Constipation, unspecified: Secondary | ICD-10-CM

## 2016-05-27 LAB — CBC WITH DIFFERENTIAL/PLATELET
BASOS ABS: 0 10*3/uL (ref 0.0–0.1)
Basophils Relative: 1 %
EOS ABS: 0.2 10*3/uL (ref 0.0–0.7)
EOS PCT: 3 %
HCT: 41.6 % (ref 36.0–46.0)
Hemoglobin: 14 g/dL (ref 12.0–15.0)
LYMPHS PCT: 32 %
Lymphs Abs: 2.1 10*3/uL (ref 0.7–4.0)
MCH: 32.2 pg (ref 26.0–34.0)
MCHC: 33.7 g/dL (ref 30.0–36.0)
MCV: 95.6 fL (ref 78.0–100.0)
Monocytes Absolute: 0.3 10*3/uL (ref 0.1–1.0)
Monocytes Relative: 5 %
NEUTROS PCT: 59 %
Neutro Abs: 3.9 10*3/uL (ref 1.7–7.7)
PLATELETS: 236 10*3/uL (ref 150–400)
RBC: 4.35 MIL/uL (ref 3.87–5.11)
RDW: 12.9 % (ref 11.5–15.5)
WBC: 6.5 10*3/uL (ref 4.0–10.5)

## 2016-05-27 LAB — COMPREHENSIVE METABOLIC PANEL
ALK PHOS: 67 U/L (ref 38–126)
ALT: 20 U/L (ref 14–54)
AST: 16 U/L (ref 15–41)
Albumin: 4.2 g/dL (ref 3.5–5.0)
Anion gap: 8 (ref 5–15)
BUN: 16 mg/dL (ref 6–20)
CHLORIDE: 104 mmol/L (ref 101–111)
CO2: 25 mmol/L (ref 22–32)
CREATININE: 0.85 mg/dL (ref 0.44–1.00)
Calcium: 9.2 mg/dL (ref 8.9–10.3)
GFR calc Af Amer: >60 mL/min (ref >60)
GFR calc non Af Amer: >60 mL/min (ref >60)
Glucose, Bld: 166 mg/dL — ABNORMAL HIGH (ref 65–99)
Potassium: 3.4 mmol/L — ABNORMAL LOW (ref 3.5–5.1)
SODIUM: 137 mmol/L (ref 135–145)
Total Bilirubin: 0.7 mg/dL (ref 0.3–1.2)
Total Protein: 7.3 g/dL (ref 6.5–8.1)

## 2016-05-27 LAB — URINALYSIS, ROUTINE W REFLEX MICROSCOPIC
BILIRUBIN URINE: NEGATIVE
GLUCOSE, UA: NEGATIVE mg/dL
HGB URINE DIPSTICK: NEGATIVE
Ketones, ur: NEGATIVE mg/dL
Leukocytes, UA: NEGATIVE
Nitrite: NEGATIVE
PH: 5 (ref 5.0–8.0)
Protein, ur: NEGATIVE mg/dL
SPECIFIC GRAVITY, URINE: 1.017 (ref 1.005–1.030)

## 2016-05-27 MED ORDER — IOPAMIDOL (ISOVUE-300) INJECTION 61%
100.0000 mL | Freq: Once | INTRAVENOUS | Status: AC | PRN
Start: 2016-05-27 — End: 2016-05-27
  Administered 2016-05-27: 100 mL via INTRAVENOUS

## 2016-05-27 MED ORDER — IOPAMIDOL (ISOVUE-300) INJECTION 61%
INTRAVENOUS | Status: AC
  Administered 2016-05-27: 15 mL
  Filled 2016-05-27: qty 30

## 2016-05-27 NOTE — ED Provider Notes (Signed)
[facility] DEPT Provider Note   CSN: [id number] Arrival date & time: 05/27/16  1440     History   Chief Complaint Chief Complaint  Patient presents with  . Pyelonephritis    HPI Cassie Cordova is a 60 y.o. female.  HPI  Pt was seen at 1700. Per pt, c/o gradual onset and persistence of constant bilateral flank "pain" for the past 10 days. Has been associated with lower abd "pain" and generalized weakness/fatigue. Pt states she was dx with UTI 1 week ago, rx cipro. States UC of that urine was sensitive to cipro. Denies N/V/D, no fevers, no rash, no CP/SOB, no dysuria/hematuria.     Past Medical History:  Diagnosis Date  . CAD (coronary artery disease)   . Diabetes mellitus, type II ([place])   . Hypertension     Patient Active Problem List   Diagnosis Date Noted  . Personal history of noncompliance with medical treatment, presenting hazards to health 04/04/2016  . Type 2 diabetes mellitus with vascular disease ([place]) 04/22/2015  . Hyperlipidemia 04/22/2015  . Essential hypertension, benign 04/22/2015  . Vitamin D deficiency 04/22/2015  . ANAL SPASM 11/10/2008    Past Surgical History:  Procedure Laterality Date  . ABDOMINAL HYSTERECTOMY    . CESAREAN SECTION    . CHOLECYSTECTOMY      OB History    Gravida Para Term Preterm AB Living   '3 3 3         '$ SAB TAB Ectopic Multiple Live Births                   Home Medications    Prior to Admission medications   Medication Sig Start Date End Date Taking? Authorizing Provider  aspirin EC 81 MG tablet Take 81 mg by mouth daily.   Yes Historical Provider, MD  atorvastatin (LIPITOR) 20 MG tablet Take 20 mg by mouth every morning.    Yes Historical Provider, MD  ciprofloxacin (CIPRO) 500 MG tablet Take 500 mg by mouth 2 (two) times daily. 10 day course starting on 05/20/2016   Yes Historical Provider, MD  co-enzyme Q-10 30 MG capsule Take 30 mg by mouth daily.   Yes Historical Provider, MD  Cranberry-Vitamin  C-Vitamin E 4200-20-3 MG-MG-UNIT CAPS Take 1 capsule by mouth daily.   Yes Historical Provider, MD  Insulin Glargine (TOUJEO SOLOSTAR) 300 UNIT/ML SOPN Inject 40 Units into the skin at bedtime. Patient taking differently: Inject 30 Units into the skin at bedtime.  04/04/16  Yes Cassandria Anger, MD  insulin lispro (HUMALOG) 100 UNIT/ML injection Inject 0.08-0.14 mLs (8-14 Units total) into the skin 3 (three) times daily before meals. 05/20/16  Yes Cassandria Anger, MD  lisinopril (PRINIVIL,ZESTRIL) 5 MG tablet TAKE 1 BY MOUTH DAILY 02/10/16  Yes Cassandria Anger, MD  metFORMIN (GLUCOPHAGE) 500 MG tablet Take 1 tablet (500 mg total) by mouth 2 (two) times daily with a meal. 04/18/16  Yes Cassandria Anger, MD  metoprolol succinate (TOPROL-XL) 25 MG 24 hr tablet Take 25 mg by mouth every morning.    Yes Historical Provider, MD  TOUJEO SOLOSTAR 300 UNIT/ML SOPN INJECT 30 UNITS S.Q. ONCE DAILY AT BEDTIME. 05/20/16  Yes Cassandria Anger, MD  venlafaxine XR (EFFEXOR-XR) 75 MG 24 hr capsule Take 75 mg by mouth daily with breakfast.   Yes Historical Provider, MD  blood glucose meter kit and supplies KIT Dispense based on patient and insurance preference. Use up to four times daily as directed. (FOR  ICD-10 E11.65) 04/06/16   Cassandria Anger, MD  Blood Glucose Monitoring Suppl (FREESTYLE LITE) DEVI 1 to test glucose 4 times a day 04/04/16   Cassandria Anger, MD  empagliflozin (JARDIANCE) 10 MG TABS tablet Take 10 mg by mouth daily. 04/04/16   Cassandria Anger, MD  insulin aspart (NOVOLOG) 100 UNIT/ML injection Inject 8-14 Units into the skin 3 (three) times daily before meals. Patient not taking: Reported on 05/27/2016 05/20/16   Cassandria Anger, MD  INSULIN SYRINGE .5CC/29G 29G X 1/2" 0.5 ML MISC Use 3 times to inject insulin 05/20/16   Cassandria Anger, MD  Lancets (FREESTYLE) lancets Use as instructed 04/04/16   Cassandria Anger, MD  nitroGLYCERIN (NITRODUR - DOSED IN  MG/24 HR) 0.4 mg/hr patch Place 0.4 mg onto the skin daily.    Historical Provider, MD    Family History Family History  Problem Relation Age of Onset  . Heart attack Other   . Diabetes Other     Social History Social History  Substance Use Topics  . Smoking status: Never Smoker  . Smokeless tobacco: Never Used  . Alcohol use No     Allergies   Patient has no known allergies.   Review of Systems Review of Systems ROS: Statement: All systems negative except as marked or noted in the HPI; Constitutional: Negative for fever and chills. +generalized weakness/fatigue.; ; Eyes: Negative for eye pain, redness and discharge. ; ; ENMT: Negative for ear pain, hoarseness, nasal congestion, sinus pressure and sore throat. ; ; Cardiovascular: Negative for chest pain, palpitations, diaphoresis, dyspnea and peripheral edema. ; ; Respiratory: Negative for cough, wheezing and stridor. ; ; Gastrointestinal: +abd pain. Negative for nausea, vomiting, diarrhea, blood in stool, hematemesis, jaundice and rectal bleeding. . ; ; Genitourinary: +flank pain. Negative for dysuria and hematuria. ; ; Musculoskeletal: Negative for back pain and neck pain. Negative for swelling and trauma.; ; Skin: Negative for pruritus, rash, abrasions, blisters, bruising and skin lesion.; ; Neuro: Negative for headache, lightheadedness and neck stiffness. Negative for altered level of consciousness, altered mental status, extremity weakness, paresthesias, involuntary movement, seizure and syncope.       Physical Exam Updated Vital Signs BP 195/91   Pulse 67   Temp 98.6 F (37 C) (Oral)   Resp 18   Ht '5\' 1"'$  (1.549 m)   Wt 168 lb (76.2 kg)   SpO2 100%   BMI 31.74 kg/m   Physical Exam 1705: Physical examination:  Nursing notes reviewed; Vital signs and O2 SAT reviewed;  Constitutional: Well developed, Well nourished, Well hydrated, In no acute distress; Head:  Normocephalic, atraumatic; Eyes: EOMI, PERRL, No scleral  icterus; ENMT: Mouth and pharynx normal, Mucous membranes moist; Neck: Supple, Full range of motion, No lymphadenopathy; Cardiovascular: Regular rate and rhythm, No gallop; Respiratory: Breath sounds clear & equal bilaterally, No wheezes.  Speaking full sentences with ease, Normal respiratory effort/excursion; Chest: Nontender, Movement normal; Abdomen: Soft, +LLQ and suprapubic tenderness to palp. No rebound or guarding. Nondistended, Normal bowel sounds; Genitourinary: No CVA tenderness; Spine:  No midline CS, TS, LS tenderness. +TTP bilat lumbar paraspinal muscles. No rash.;; Extremities: Pulses normal, No tenderness, No edema, No calf edema or asymmetry.; Neuro: AA&Ox3, Major CN grossly intact.  Speech clear. No gross focal motor or sensory deficits in extremities.; Skin: Color normal, Warm, Dry.   ED Treatments / Results  Labs (all labs ordered are listed, but only abnormal results are displayed)   EKG  EKG Interpretation None  Radiology   Procedures Procedures (including critical care time)  Medications Ordered in ED Medications - No data to display   Initial Impression / Assessment and Plan / ED Course  I have reviewed the triage vital signs and the nursing notes.  Pertinent labs & imaging results that were available during my care of the patient were reviewed by me and considered in my medical decision making (see chart for details).  MDM Reviewed: previous chart, nursing note and vitals Reviewed previous: labs Interpretation: labs and CT scan   Results for orders placed or performed during the hospital encounter of 05/27/16  CBC with Differential  Result Value Ref Range   WBC 6.5 4.0 - 10.5 K/uL   RBC 4.35 3.87 - 5.11 MIL/uL   Hemoglobin 14.0 12.0 - 15.0 g/dL   HCT 41.6 36.0 - 46.0 %   MCV 95.6 78.0 - 100.0 fL   MCH 32.2 26.0 - 34.0 pg   MCHC 33.7 30.0 - 36.0 g/dL   RDW 12.9 11.5 - 15.5 %   Platelets 236 150 - 400 K/uL   Neutrophils Relative % 59 %    Neutro Abs 3.9 1.7 - 7.7 K/uL   Lymphocytes Relative 32 %   Lymphs Abs 2.1 0.7 - 4.0 K/uL   Monocytes Relative 5 %   Monocytes Absolute 0.3 0.1 - 1.0 K/uL   Eosinophils Relative 3 %   Eosinophils Absolute 0.2 0.0 - 0.7 K/uL   Basophils Relative 1 %   Basophils Absolute 0.0 0.0 - 0.1 K/uL  Comprehensive metabolic panel  Result Value Ref Range   Sodium 137 135 - 145 mmol/L   Potassium 3.4 (L) 3.5 - 5.1 mmol/L   Chloride 104 101 - 111 mmol/L   CO2 25 22 - 32 mmol/L   Glucose, Bld 166 (H) 65 - 99 mg/dL   BUN 16 6 - 20 mg/dL   Creatinine, Ser 0.85 0.44 - 1.00 mg/dL   Calcium 9.2 8.9 - 10.3 mg/dL   Total Protein 7.3 6.5 - 8.1 g/dL   Albumin 4.2 3.5 - 5.0 g/dL   AST 16 15 - 41 U/L   ALT 20 14 - 54 U/L   Alkaline Phosphatase 67 38 - 126 U/L   Total Bilirubin 0.7 0.3 - 1.2 mg/dL   GFR calc non Af Amer >60 >60 mL/min   GFR calc Af Amer >60 >60 mL/min   Anion gap 8 5 - 15  Urinalysis, Routine w reflex microscopic  Result Value Ref Range   Color, Urine YELLOW YELLOW   APPearance CLEAR CLEAR   Specific Gravity, Urine 1.017 1.005 - 1.030   pH 5.0 5.0 - 8.0   Glucose, UA NEGATIVE NEGATIVE mg/dL   Hgb urine dipstick NEGATIVE NEGATIVE   Bilirubin Urine NEGATIVE NEGATIVE   Ketones, ur NEGATIVE NEGATIVE mg/dL   Protein, ur NEGATIVE NEGATIVE mg/dL   Nitrite NEGATIVE NEGATIVE   Leukocytes, UA NEGATIVE NEGATIVE   Ct Abdomen Pelvis W Contrast Result Date: 05/27/2016 CLINICAL DATA:  Flank and abdominal pain. Recent urinary tract infection. EXAM: CT ABDOMEN AND PELVIS WITH CONTRAST TECHNIQUE: Multidetector CT imaging of the abdomen and pelvis was performed using the standard protocol following bolus administration of intravenous contrast. CONTRAST:  157m ISOVUE-300 IOPAMIDOL (ISOVUE-300) INJECTION 61%, 1 ISOVUE-300 IOPAMIDOL (ISOVUE-300) INJECTION 61% COMPARISON:  None. FINDINGS: LOWER CHEST: Lung bases are clear. Included heart size is normal. No pericardial effusion. HEPATOBILIARY:  Cholecystectomy. Unremarkable liver. No space-occupying mass noted no ductal dilatation seen. PANCREAS: Normal. SPLEEN: Normal. ADRENALS/URINARY TRACT:  Kidneys are orthotopic, demonstrating symmetric enhancement. No nephrolithiasis, hydronephrosis or solid renal masses. The unopacified ureters are normal in course and caliber. Delayed imaging through the kidneys demonstrates symmetric prompt contrast excretion within the proximal urinary collecting system. Urinary bladder is partially distended and unremarkable. Normal adrenal glands. STOMACH/BOWEL: The stomach, small and large bowel are normal in course and caliber without inflammatory changes. Moderate amount of fecal retention throughout large bowel consistent with constipation. Scattered colonic diverticulosis is noted. Normal appendix. VASCULAR/LYMPHATIC: Aortoiliac vessels are normal in course and caliber. No lymphadenopathy by CT size criteria. REPRODUCTIVE: Normal. OTHER: No intraperitoneal free fluid or free air. MUSCULOSKELETAL: Nonacute. IMPRESSION: Unremarkable appearance of the genitourinary system. No CT findings of pyelonephritis or renal abscess. No obstructive uropathy. Increased colonic stool burden consistent with constipation. No bowel inflammation is noted. There is scattered colonic diverticulosis. Electronically Signed   By: Ashley Royalty M.D.   On: 05/27/2016 19:41    2100:  Workup reassuring. Tx symptomatically, f/u PMD. Dx and testing d/w pt and family.  Questions answered.  Verb understanding, agreeable to d/c home with outpt f/u.  Final Clinical Impressions(s) / ED Diagnoses   Final diagnoses:  None    New Prescriptions New Prescriptions   No medications on file     Francine Graven, DO 05/30/16 0045

## 2016-05-27 NOTE — Discharge Instructions (Signed)
Take over the counter tylenol and ibuprofen, as directed on packaging, as needed for discomfort. Apply moist heat or ice to the area(s) of discomfort, for 15 minutes at a time, several times per day for the next few days.  Do not fall asleep on a heating or ice pack.  Take over the counter laxative (such as miralax, milk of magnesia, senokot) AND a dulcolax suppository today and repeat both tomorrow.  Begin to take over the counter stool softener (colace), as directed on packaging, for the next month.  Continue to take your usual prescriptions as previously directed.  Call your regular medical doctor on Monday to schedule a follow up appointment within the next week.  Return to the Emergency Department immediately if worsening.

## 2016-05-27 NOTE — ED Triage Notes (Signed)
Pt sent by PCP for follow-up treatment for pyelonephritis. Pt has been on Cipro for 1 week but symptoms are not improving. Urine culture was done and was sensitive to Cipro.

## 2016-05-30 LAB — URINE CULTURE: Culture: 10000 — AB

## 2016-06-21 ENCOUNTER — Ambulatory Visit: Payer: BLUE CROSS/BLUE SHIELD | Admitting: "Endocrinology

## 2016-06-29 ENCOUNTER — Other Ambulatory Visit: Payer: Self-pay | Admitting: "Endocrinology

## 2016-06-29 LAB — HEMOGLOBIN A1C
Hgb A1c MFr Bld: 8.1 % — ABNORMAL HIGH (ref <5.7)
Mean Plasma Glucose: 186 mg/dL

## 2016-06-29 LAB — COMPREHENSIVE METABOLIC PANEL
ALT: 22 U/L (ref 6–29)
AST: 15 U/L (ref 10–35)
Albumin: 4.2 g/dL (ref 3.6–5.1)
Alkaline Phosphatase: 72 U/L (ref 33–130)
BILIRUBIN TOTAL: 0.5 mg/dL (ref 0.2–1.2)
BUN: 18 mg/dL (ref 7–25)
CO2: 27 mmol/L (ref 20–31)
CREATININE: 0.88 mg/dL (ref 0.50–1.05)
Calcium: 9.2 mg/dL (ref 8.6–10.4)
Chloride: 106 mmol/L (ref 98–110)
GLUCOSE: 166 mg/dL — AB (ref 65–99)
Potassium: 4.1 mmol/L (ref 3.5–5.3)
Sodium: 141 mmol/L (ref 135–146)
Total Protein: 6.8 g/dL (ref 6.1–8.1)

## 2016-07-06 ENCOUNTER — Ambulatory Visit: Payer: BLUE CROSS/BLUE SHIELD | Admitting: "Endocrinology

## 2016-07-20 ENCOUNTER — Encounter: Payer: Self-pay | Admitting: "Endocrinology

## 2016-07-20 ENCOUNTER — Ambulatory Visit (INDEPENDENT_AMBULATORY_CARE_PROVIDER_SITE_OTHER): Payer: BLUE CROSS/BLUE SHIELD | Admitting: "Endocrinology

## 2016-07-20 ENCOUNTER — Other Ambulatory Visit: Payer: Self-pay

## 2016-07-20 VITALS — BP 133/85 | HR 74 | Ht 61.0 in | Wt 176.0 lb

## 2016-07-20 DIAGNOSIS — I10 Essential (primary) hypertension: Secondary | ICD-10-CM | POA: Diagnosis not present

## 2016-07-20 DIAGNOSIS — E1159 Type 2 diabetes mellitus with other circulatory complications: Secondary | ICD-10-CM

## 2016-07-20 DIAGNOSIS — Z9119 Patient's noncompliance with other medical treatment and regimen: Secondary | ICD-10-CM

## 2016-07-20 DIAGNOSIS — E782 Mixed hyperlipidemia: Secondary | ICD-10-CM | POA: Diagnosis not present

## 2016-07-20 DIAGNOSIS — Z91199 Patient's noncompliance with other medical treatment and regimen due to unspecified reason: Secondary | ICD-10-CM

## 2016-07-20 MED ORDER — METFORMIN HCL ER (MOD) 1000 MG PO TB24: 1000.0000 mg | ORAL_TABLET | Freq: Every day | ORAL | 2 refills | Status: DC

## 2016-07-20 MED ORDER — METFORMIN HCL ER 500 MG PO TB24: 1000.0000 mg | ORAL_TABLET | Freq: Every day | ORAL | 0 refills | Status: DC

## 2016-07-20 NOTE — Progress Notes (Signed)
Subjective:    Patient ID: Cassie Cordova, female    DOB: 1955-11-08,    Past Medical History:  Diagnosis Date  . CAD (coronary artery disease)   . Diabetes mellitus, type II ([place])   . Hypertension    Past Surgical History:  Procedure Laterality Date  . ABDOMINAL HYSTERECTOMY    . CESAREAN SECTION    . CHOLECYSTECTOMY     Social History   Social History  . Marital status: Married    Spouse name: N/A  . Number of children: N/A  . Years of education: N/A   Social History Main Topics  . Smoking status: Never Smoker  . Smokeless tobacco: Never Used  . Alcohol use No  . Drug use: No  . Sexual activity: Not Asked   Other Topics Concern  . None   Social History Narrative  . None   Outpatient Encounter Prescriptions as of 07/20/2016  Medication Sig  . aspirin EC 81 MG tablet Take 81 mg by mouth daily.  Marland Kitchen atorvastatin (LIPITOR) 20 MG tablet Take 20 mg by mouth every morning.   . blood glucose meter kit and supplies KIT Dispense based on patient and insurance preference. Use up to four times daily as directed. (FOR ICD-10 E11.65)  . Blood Glucose Monitoring Suppl (FREESTYLE LITE) DEVI 1 to test glucose 4 times a day  . co-enzyme Q-10 30 MG capsule Take 30 mg by mouth daily.  . Cranberry-Vitamin C-Vitamin E 4200-20-3 MG-MG-UNIT CAPS Take 1 capsule by mouth daily.  . Insulin Glargine (TOUJEO SOLOSTAR) 300 UNIT/ML SOPN Inject 40 Units into the skin at bedtime. (Patient taking differently: Inject 30 Units into the skin at bedtime. )  . insulin lispro (HUMALOG) 100 UNIT/ML injection Inject 0.08-0.14 mLs (8-14 Units total) into the skin 3 (three) times daily before meals.  . INSULIN SYRINGE .5CC/29G 29G X 1/2" 0.5 ML MISC Use 3 times to inject insulin  . Lancets (FREESTYLE) lancets Use as instructed  . lisinopril (PRINIVIL,ZESTRIL) 5 MG tablet TAKE 1 BY MOUTH DAILY  . metFORMIN (GLUMETZA) 1000 MG (MOD) 24 hr tablet Take 1 tablet (1,000 mg total) by mouth daily with  breakfast.  . metoprolol succinate (TOPROL-XL) 25 MG 24 hr tablet Take 25 mg by mouth every morning.   . nitroGLYCERIN (NITRODUR - DOSED IN MG/24 HR) 0.4 mg/hr patch Place 0.4 mg onto the skin daily.  Marland Kitchen venlafaxine XR (EFFEXOR-XR) 75 MG 24 hr capsule Take 75 mg by mouth daily with breakfast.  . [DISCONTINUED] ciprofloxacin (CIPRO) 500 MG tablet Take 500 mg by mouth 2 (two) times daily. 10 day course starting on 05/20/2016  . [DISCONTINUED] empagliflozin (JARDIANCE) 10 MG TABS tablet Take 10 mg by mouth daily.  . [DISCONTINUED] insulin aspart (NOVOLOG) 100 UNIT/ML injection Inject 8-14 Units into the skin 3 (three) times daily before meals. (Patient not taking: Reported on 05/27/2016)  . [DISCONTINUED] metFORMIN (GLUCOPHAGE) 500 MG tablet Take 1 tablet (500 mg total) by mouth 2 (two) times daily with a meal.  . [DISCONTINUED] TOUJEO SOLOSTAR 300 UNIT/ML SOPN INJECT 30 UNITS S.Q. ONCE DAILY AT BEDTIME.   No facility-administered encounter medications on file as of 07/20/2016.    ALLERGIES: No Known Allergies VACCINATION STATUS:  There is no immunization history on file for this patient.  Diabetes  She presents for her follow-up diabetic visit. She has type 2 diabetes mellitus. Onset time: She was diagnosed at approximate age of 6 years. Her disease course has been improving. There are no hypoglycemic  associated symptoms. Pertinent negatives for hypoglycemia include no confusion, headaches, pallor or seizures. Associated symptoms include fatigue. Pertinent negatives for diabetes include no chest pain, no polydipsia, no polyphagia and no polyuria. There are no hypoglycemic complications. Symptoms are improving. Diabetic complications include heart disease. Risk factors for coronary artery disease include diabetes mellitus, dyslipidemia, hypertension and sedentary lifestyle. Current diabetic treatment includes insulin injections and oral agent (monotherapy). She is compliant with treatment some of the  time (She admits inconsistency in taking her invokana and Trulicity. She also missed some doses of Toujeo as well.). Her weight is stable. She is following a generally unhealthy diet. When asked about meal planning, she reported none. She has had a previous visit with a dietitian. She rarely participates in exercise. Her home blood glucose trend is decreasing steadily. Her breakfast blood glucose range is generally 130-140 mg/dl. Her lunch blood glucose range is generally 130-140 mg/dl. Her dinner blood glucose range is generally 130-140 mg/dl. Her overall blood glucose range is 130-140 mg/dl. An ACE inhibitor/angiotensin II receptor blocker is being taken. Eye exam is current.  Hypertension  This is a chronic problem. The problem is uncontrolled. Pertinent negatives include no chest pain, headaches, palpitations or shortness of breath. Risk factors for coronary artery disease include dyslipidemia, diabetes mellitus and sedentary lifestyle. Past treatments include ACE inhibitors. The current treatment provides no improvement.  Hyperlipidemia  This is a chronic problem. The current episode started more than 1 year ago. The problem is controlled. Recent lipid tests were reviewed and are normal. Pertinent negatives include no chest pain, myalgias or shortness of breath. Current antihyperlipidemic treatment includes statins. Risk factors for coronary artery disease include dyslipidemia, diabetes mellitus, hypertension and obesity.    Review of Systems  Constitutional: Positive for fatigue. Negative for unexpected weight change.  HENT: Negative for trouble swallowing and voice change.   Eyes: Negative for visual disturbance.  Respiratory: Negative for cough, shortness of breath and wheezing.   Cardiovascular: Negative for chest pain, palpitations and leg swelling.  Gastrointestinal: Negative for diarrhea, nausea and vomiting.  Endocrine: Negative for cold intolerance, heat intolerance, polydipsia,  polyphagia and polyuria.  Musculoskeletal: Negative for arthralgias and myalgias.  Skin: Negative for color change, pallor, rash and wound.  Neurological: Negative for seizures and headaches.  Psychiatric/Behavioral: Negative for confusion and suicidal ideas.    Objective:    BP 133/85   Pulse 74   Ht '5\' 1"'$  (1.549 m)   Wt 176 lb (79.8 kg)   BMI 33.25 kg/m   Wt Readings from Last 3 Encounters:  07/20/16 176 lb (79.8 kg)  05/27/16 168 lb (76.2 kg)  04/18/16 173 lb (78.5 kg)    Physical Exam  Constitutional: She is oriented to person, place, and time. She appears well-developed.  HENT:  Head: Normocephalic and atraumatic.  Eyes: EOM are normal.  Neck: Normal range of motion. Neck supple. No tracheal deviation present. No thyromegaly present.  Cardiovascular: Normal rate and regular rhythm.   Pulmonary/Chest: Effort normal and breath sounds normal.  Abdominal: Soft. Bowel sounds are normal. There is no tenderness. There is no guarding.  Musculoskeletal: Normal range of motion. She exhibits no edema.  Neurological: She is alert and oriented to person, place, and time. She has normal reflexes. No cranial nerve deficit. Coordination normal.  Skin: Skin is warm and dry. No rash noted. No erythema. No pallor.  Psychiatric: She has a normal mood and affect. Judgment normal.    Results for orders placed or performed in visit  on 06/29/16  Comprehensive metabolic panel  Result Value Ref Range   Sodium 141 135 - 146 mmol/L   Potassium 4.1 3.5 - 5.3 mmol/L   Chloride 106 98 - 110 mmol/L   CO2 27 20 - 31 mmol/L   Glucose, Bld 166 (H) 65 - 99 mg/dL   BUN 18 7 - 25 mg/dL   Creat 7.00 1.74 - 9.44 mg/dL   Total Bilirubin 0.5 0.2 - 1.2 mg/dL   Alkaline Phosphatase 72 33 - 130 U/L   AST 15 10 - 35 U/L   ALT 22 6 - 29 U/L   Total Protein 6.8 6.1 - 8.1 g/dL   Albumin 4.2 3.6 - 5.1 g/dL   Calcium 9.2 8.6 - 96.7 mg/dL  Hemoglobin R9F  Result Value Ref Range   Hgb A1c MFr Bld 8.1 (H) <5.7  %   Mean Plasma Glucose 186 mg/dL   Complete Blood Count (Most recent): Lab Results  Component Value Date   WBC 6.5 05/27/2016   HGB 14.0 05/27/2016   HCT 41.6 05/27/2016   MCV 95.6 05/27/2016   PLT 236 05/27/2016   Chemistry (most recent): Lab Results  Component Value Date   NA 141 06/29/2016   K 4.1 06/29/2016   CL 106 06/29/2016   CO2 27 06/29/2016   BUN 18 06/29/2016   CREATININE 0.88 06/29/2016   Diabetic Labs (most recent): Lab Results  Component Value Date   HGBA1C 8.1 (H) 06/29/2016   HGBA1C 10.5 (H) 03/28/2016   HGBA1C 9.3 (H) 10/27/2015     Assessment & Plan:   1. Type 2 diabetes mellitus with vascular disease (HCC)  -Her diabetes is  complicated by coronary artery disease and patient remains at a high risk for more acute and chronic complications of diabetes which include CAD, CVA, CKD, retinopathy, and neuropathy. These are all discussed in detail with the patient.  Patient came With improved control with A1c of 8.1% improving from 10.5% last visit.  - However, of late she is withdrawing from the strict monitoring of blood glucose and her meter shows only 6 readings in the last 14 days averaging 228 . - This  behavior will make her loose control again.    - I have re-counseled the patient on diet management and weight loss  by adopting a carbohydrate restricted / protein rich  Diet.  - Suggestion is made for patient to avoid simple carbohydrates   from their diet including Cakes , Desserts, Ice Cream,  Soda (  diet and regular) , Sweet Tea , Candies,  Chips, Cookies, Artificial Sweeteners,   and "Sugar-free" Products .  This will help patient to have stable blood glucose profile and potentially avoid unintended  Weight gain.  - Patient is advised to stick to a routine mealtimes to eat 3 meals  a day and avoid unnecessary snacks ( to snack only to correct hypoglycemia).  - I have approached patient with the following individualized plan to manage diabetes  and patient agrees.  -  She will continue to require basal/bolus insulin to control diabetes.  - I will continue Toujeo  30 units QHS, lower Humalog to 5 units 3 times a day before meals ( she is requesting for Humalog vials, her insurance did not cover for NovoLog and will cost her more for Humalog pens) for pre-meal blood glucose above 90 mg/dL plus patient specific correction dose, associated with strict monitoring of glucose   Before breakfast and at bedtime.  -Patient is encouraged to  call clinic for blood glucose levels less than 70 or above 200 mg /dl. - Her insurance is not helpful for SGLT 2 inhibitors, Invokana and Jardiance. - She is tolerating metformin better. I advised her to maximize to 1000 mg extended release once a day after supper.  - Patient specific target  for A1c; LDL, HDL, Triglycerides, and  Waist Circumference were discussed in detail.  2) BP/HTN: Improved.  I advised her to  continue fosinopril 5 mg by mouth every morning  along with her metoprolol 25 mg by mouth every morning.   She may need increased dose of fosinopril if blood pressure remains above target. 3) Lipids/HPL:  continue statins. 4)  Weight/Diet: CDE consult in progress, exercise, and carbohydrates information provided.  5) Vitamin D deficiency She is status post therapy with vitamin D  50,000 units by mouth weekly for the next 12 weeks.  5) Chronic Care/Health Maintenance:  -Patient  on ACEI/ARB and Statin medications and encouraged to continue to follow up with Ophthalmology, Podiatrist at least yearly or according to recommendations, and advised to  stay away from smoking. I have recommended yearly flu vaccine and pneumonia vaccination at least every 5 years; moderate intensity exercise for up to 150 minutes weekly; and  sleep for at least 7 hours a day.  I advised patient to maintain close follow up with their PCP for primary care needs.  Patient is asked to bring meter and  blood glucose logs  during their next visit.   Follow up plan: Return in about 3 months (around 10/17/2016) for follow up with pre-visit labs, meter, and logs.  Glade Lloyd, MD Phone: [phone]  Fax: [phone]   07/20/2016, 3:28 PM

## 2016-09-06 ENCOUNTER — Encounter: Payer: Self-pay | Admitting: Cardiovascular Disease

## 2016-09-06 ENCOUNTER — Other Ambulatory Visit: Payer: Self-pay | Admitting: Cardiovascular Disease

## 2016-09-06 ENCOUNTER — Ambulatory Visit
Admission: RE | Admit: 2016-09-06 | Discharge: 2016-09-06 | Disposition: A | Discharge status: Home / Self Care | Payer: BLUE CROSS/BLUE SHIELD | Source: Ambulatory Visit | Attending: Cardiovascular Disease | Admitting: Cardiovascular Disease

## 2016-09-06 ENCOUNTER — Ambulatory Visit (INDEPENDENT_AMBULATORY_CARE_PROVIDER_SITE_OTHER): Payer: BLUE CROSS/BLUE SHIELD | Admitting: Cardiovascular Disease

## 2016-09-06 VITALS — BP 186/90 | HR 67 | Ht 61.0 in | Wt 177.4 lb

## 2016-09-06 DIAGNOSIS — R079 Chest pain, unspecified: Secondary | ICD-10-CM

## 2016-09-06 DIAGNOSIS — I2510 Atherosclerotic heart disease of native coronary artery without angina pectoris: Secondary | ICD-10-CM | POA: Diagnosis not present

## 2016-09-06 DIAGNOSIS — I2583 Coronary atherosclerosis due to lipid rich plaque: Secondary | ICD-10-CM

## 2016-09-06 DIAGNOSIS — I10 Essential (primary) hypertension: Secondary | ICD-10-CM | POA: Diagnosis not present

## 2016-09-06 DIAGNOSIS — Z01812 Encounter for preprocedural laboratory examination: Secondary | ICD-10-CM | POA: Diagnosis not present

## 2016-09-06 LAB — CBC WITH DIFFERENTIAL/PLATELET
BASOS ABS: 0 {cells}/uL (ref 0–200)
Basophils Relative: 0 %
Eosinophils Absolute: 102 cells/uL (ref 15–500)
Eosinophils Relative: 2 %
HEMATOCRIT: 43.2 % (ref 35.0–45.0)
HEMOGLOBIN: 14.5 g/dL (ref 11.7–15.5)
LYMPHS ABS: 1989 {cells}/uL (ref 850–3900)
Lymphocytes Relative: 39 %
MCH: 31.8 pg (ref 27.0–33.0)
MCHC: 33.6 g/dL (ref 32.0–36.0)
MCV: 94.7 fL (ref 80.0–100.0)
MONO ABS: 255 {cells}/uL (ref 200–950)
MPV: 10.4 fL (ref 7.5–12.5)
Monocytes Relative: 5 %
NEUTROS PCT: 54 %
Neutro Abs: 2754 cells/uL (ref 1500–7800)
Platelets: 246 10*3/uL (ref 140–400)
RBC: 4.56 MIL/uL (ref 3.80–5.10)
RDW: 14 % (ref 11.0–15.0)
WBC: 5.1 10*3/uL (ref 3.8–10.8)

## 2016-09-06 LAB — BASIC METABOLIC PANEL WITH GFR
BUN: 14 mg/dL (ref 7–25)
CO2: 25 mmol/L (ref 20–31)
CREATININE: 0.84 mg/dL (ref 0.50–1.05)
Calcium: 9.7 mg/dL (ref 8.6–10.4)
Chloride: 107 mmol/L (ref 98–110)
GFR, Est African American: 89 mL/min (ref >=60)
GFR, Est Non African American: 77 mL/min (ref >=60)
GLUCOSE: 207 mg/dL — AB (ref 65–99)
Potassium: 4.8 mmol/L (ref 3.5–5.3)
Sodium: 140 mmol/L (ref 135–146)

## 2016-09-06 MED ORDER — METOPROLOL SUCCINATE ER 50 MG PO TB24: 50.0000 mg | ORAL_TABLET | Freq: Every morning | ORAL | 1 refills | Status: DC

## 2016-09-06 MED ORDER — ISOSORBIDE MONONITRATE ER 30 MG PO TB24
30.0000 mg | ORAL_TABLET | Freq: Every day | ORAL | 3 refills | Status: DC
Start: 2016-09-06 — End: 2016-09-07

## 2016-09-06 NOTE — Patient Instructions (Signed)
Medication Instructions: START Isosorbide 30 mg daily  Increase Metoprolol to 50 mg daily.   Testing/Procedures: Your physician has requested that you have a cardiac catheterization on Monday, 09/12/16 with Dr. Allyson SabalBerry (Dx: Chest Pain). Cardiac catheterization is used to diagnose and/or treat various heart conditions. Doctors may recommend this procedure for a number of different reasons. The most common reason is to evaluate chest pain. Chest pain can be a symptom of coronary artery disease (CAD), and cardiac catheterization can show whether plaque is narrowing or blocking your heart's arteries. This procedure is also used to evaluate the valves, as well as measure the blood flow and oxygen levels in different parts of your heart. For further information please visit [email or web address].   Following your catheterization, you will not be allowed to drive for 3 days.  No lifting, pushing, or pulling greater that 10 pounds is allowed for 1 week.  You will be required to have the following tests prior to the procedure:  1. Blood work-the blood work can be done no more than 14 days prior to the procedure.  It can be done at any [facility] lab.  There is one downstairs on the first floor of this building and one in the [facility] building [phone]([address], [address]).  2. Chest Xray-the chest xray order has already been placed at the [facility].      Puncture site Rt Radial

## 2016-09-06 NOTE — Assessment & Plan Note (Signed)
History of hyperlipidemia on statin therapy followed by her PCP. 

## 2016-09-06 NOTE — Assessment & Plan Note (Signed)
History of essential hypertension blood pressure measured at 186/90 which is higher than usual. She is on metoprolol and lisinopril. Continue current meds at current dosing

## 2016-09-06 NOTE — Assessment & Plan Note (Signed)
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 [facility] was [facility] where she underwent cardiac catheterization. That time she had a Xience 5 drug eluding stent (2.5 mm x 23 mm) implanted in her LAD. 3 days later she had an integrity resolute 2.25 mm x 12 mm stent placed in her left PDA. She was on Brilenta for 1 year after which this was discontinued. Over the last one or 2 months she's noticed exertional dyspnea and substernal chest pressure. I'm going to add long-acting by mouth nitrate as well as titrate her beta blocker. We will plan on performing radial diagnostic cath next Monday.The patient understands that risks included but are not limited to stroke (1 in 1000), death (1 in 1000), kidney failure [usually temporary] (1 in 500), bleeding (1 in 200), allergic reaction [possibly serious] (1 in 200). The patient understands and agrees to proceed

## 2016-09-06 NOTE — Progress Notes (Signed)
09/06/2016 Cassie Cordova   Mar 05, 1956  [id number]  Primary Physician Asencion Noble, MD Primary Cardiologist: Lorretta Harp MD Renae Gloss  HPI:  Cassie Cordova is a delightful 61 year old mildly overweight married Caucasian female mother of 3 children, grandmother of 32 grandchildren this accompanied by her husband Cassie Cordova and one of her children Cassie Cordova. I take care of her step father, Cassie Cordova as well as her uncle Cassie Cordova. Her primary care provider is Dr. Asencion Noble in [place]. 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 She underwent cardiac catheterization and stenting of her proximal LAD with a Xience 5 drug-eluting stent (2.5 mm/23 mm). 3 days later she had a staged intervention on her left PDA with a integrity resolute drug-eluting stent (2.25 mm x 12 mm). Her other problems include treated hypertension, diabetes and hyperlipidemia. Over the last 2-3 months she's noticed increasing dyspnea on exertion and substernal chest pressure.   Current Outpatient Prescriptions  Medication Sig Dispense Refill  . aspirin EC 81 MG tablet Take 81 mg by mouth daily.    Marland Kitchen atorvastatin (LIPITOR) 20 MG tablet Take 20 mg by mouth every morning.     . blood glucose meter kit and supplies KIT Dispense based on patient and insurance preference. Use up to four times daily as directed. (FOR ICD-10 E11.65) 1 each 0  . Blood Glucose Monitoring Suppl (FREESTYLE LITE) DEVI 1 to test glucose 4 times a day 1 each prn  . co-enzyme Q-10 30 MG capsule Take 30 mg by mouth daily.    . Cranberry-Vitamin C-Vitamin E 4200-20-3 MG-MG-UNIT CAPS Take 1 capsule by mouth daily.    . Insulin Glargine (TOUJEO SOLOSTAR) 300 UNIT/ML SOPN Inject 40 Units into the skin at bedtime. (Patient taking differently: Inject 30 Units into the skin at bedtime. ) 3 pen 1  . insulin lispro (HUMALOG) 100 UNIT/ML  injection Inject 0.08-0.14 mLs (8-14 Units total) into the skin 3 (three) times daily before meals. 10 mL 2  . INSULIN SYRINGE .5CC/29G 29G X 1/2" 0.5 ML MISC Use 3 times to inject insulin 100 each 2  . Lancets (FREESTYLE) lancets Use as instructed 150 each 3  . lisinopril (PRINIVIL,ZESTRIL) 5 MG tablet TAKE 1 BY MOUTH DAILY 90 tablet 0  . metFORMIN (GLUCOPHAGE-XR) 500 MG 24 hr tablet Take 2 tablets (1,000 mg total) by mouth daily with breakfast. 180 tablet 0  . metoprolol succinate (TOPROL-XL) 50 MG 24 hr tablet Take 1 tablet (50 mg total) by mouth every morning. 90 tablet 1  . nitroGLYCERIN (NITRODUR - DOSED IN MG/24 HR) 0.4 mg/hr patch Place 0.4 mg onto the skin daily.    Marland Kitchen venlafaxine XR (EFFEXOR-XR) 75 MG 24 hr capsule Take 75 mg by mouth daily with breakfast.    . isosorbide mononitrate (IMDUR) 30 MG 24 hr tablet Take 1 tablet (30 mg total) by mouth daily. 90 tablet 3   No current facility-administered medications for this visit.     No Known Allergies  Social History   Social History  . Marital status: Married    Spouse name: N/A  . Number of children: N/A  . Years of education: N/A   Occupational History  . Not on file.   Social History Main Topics  . Smoking status: Never Smoker  . Smokeless tobacco: Never Used  . Alcohol use No  . Drug use: No  .  Sexual activity: Not on file   Other Topics Concern  . Not on file   Social History Narrative  . No narrative on file     Review of Systems: General: negative for chills, fever, night sweats or weight changes.  Cardiovascular: negative for chest pain, dyspnea on exertion, edema, orthopnea, palpitations, paroxysmal nocturnal dyspnea or shortness of breath Dermatological: negative for rash Respiratory: negative for cough or wheezing Urologic: negative for hematuria Abdominal: negative for nausea, vomiting, diarrhea, bright red blood per rectum, melena, or hematemesis Neurologic: negative for visual changes, syncope, or  dizziness All other systems reviewed and are otherwise negative except as noted above.    Blood pressure (!) 186/90, pulse 67, height _0  (1.549 m), weight 177 lb 6.4 oz (80.5 kg).  General appearance: alert and no distress Neck: no adenopathy, no carotid bruit, no JVD, supple, symmetrical, trachea midline and thyroid not enlarged, symmetric, no tenderness/mass/nodules Lungs: clear to auscultation bilaterally Heart: regular rate and rhythm, S1, S2 normal, no murmur, click, rub or gallop Extremities: extremities normal, atraumatic, no cyanosis or edema  EKG normal sinus rhythm at 67 with nonspecific ST and T-wave changes. I personally reviewed this EKG  ASSESSMENT AND PLAN:   Hyperlipidemia History of hyperlipidemia on statin therapy followed by her PCP  Essential hypertension, benign History of essential hypertension blood pressure measured at 186/90 which is higher than usual. She is on metoprolol and lisinopril. 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 She was taken to take [facility] where she underwent cardiac catheterization. That time she had a Xience 5 drug eluding stent (2.5 mm x 23 mm) implanted in her LAD. 3 days later she had an integrity resolute 2.25 mm x 12 mm stent placed in her left PDA. She was on Brilenta for 1 year after which this was discontinued. Over the last one or 2 months she's noticed exertional dyspnea and substernal chest pressure. I'm going to add long-acting by mouth nitrate as well as titrate her beta blocker. We will plan on performing radial diagnostic cath next Monday.The patient understands that risks included but are not limited to stroke (1 in 1000), death (1 in 9), kidney failure [usually temporary] (1 in 500), bleeding (1 in 200), allergic reaction [possibly serious] (1 in 200).  The patient understands and agrees to proceed      Lorretta Harp MD [facility], [facility] 09/06/2016 9:47 AM

## 2016-09-07 ENCOUNTER — Telehealth: Payer: Self-pay | Admitting: Cardiovascular Disease

## 2016-09-07 LAB — PROTIME-INR
INR: 1
Prothrombin Time: 10.3 s (ref 9.0–11.5)

## 2016-09-07 LAB — APTT: aPTT: 25 s (ref 22–34)

## 2016-09-07 MED ORDER — METOPROLOL SUCCINATE ER 50 MG PO TB24: 50.0000 mg | ORAL_TABLET | Freq: Every morning | ORAL | 3 refills | Status: DC

## 2016-09-07 MED ORDER — ISOSORBIDE MONONITRATE ER 30 MG PO TB24: 30.0000 mg | ORAL_TABLET | Freq: Every day | ORAL | 2 refills | Status: DC

## 2016-09-07 NOTE — Telephone Encounter (Signed)
Rx request sent to pharmacy.  

## 2016-09-08 ENCOUNTER — Emergency Department (HOSPITAL_COMMUNITY): Payer: BLUE CROSS/BLUE SHIELD

## 2016-09-08 ENCOUNTER — Inpatient Hospital Stay (HOSPITAL_COMMUNITY)
Admission: EM | Admit: 2016-09-08 | Discharge: 2016-09-10 | DRG: 246 | Disposition: A | Discharge status: Home / Self Care | Payer: BLUE CROSS/BLUE SHIELD | Attending: Cardiovascular Disease | Admitting: Cardiovascular Disease

## 2016-09-08 ENCOUNTER — Encounter (HOSPITAL_COMMUNITY): Payer: Self-pay | Admitting: Emergency Medicine

## 2016-09-08 DIAGNOSIS — Z90710 Acquired absence of both cervix and uterus: Secondary | ICD-10-CM | POA: Diagnosis not present

## 2016-09-08 DIAGNOSIS — I10 Essential (primary) hypertension: Secondary | ICD-10-CM | POA: Diagnosis not present

## 2016-09-08 DIAGNOSIS — Z79899 Other long term (current) drug therapy: Secondary | ICD-10-CM | POA: Diagnosis not present

## 2016-09-08 DIAGNOSIS — Z7982 Long term (current) use of aspirin: Secondary | ICD-10-CM | POA: Diagnosis not present

## 2016-09-08 DIAGNOSIS — I25110 Atherosclerotic heart disease of native coronary artery with unstable angina pectoris: Principal | ICD-10-CM | POA: Diagnosis present

## 2016-09-08 DIAGNOSIS — I110 Hypertensive heart disease with heart failure: Secondary | ICD-10-CM | POA: Diagnosis present

## 2016-09-08 DIAGNOSIS — Z794 Long term (current) use of insulin: Secondary | ICD-10-CM | POA: Diagnosis not present

## 2016-09-08 DIAGNOSIS — X58XXXA Exposure to other specified factors, initial encounter: Secondary | ICD-10-CM | POA: Diagnosis present

## 2016-09-08 DIAGNOSIS — E1159 Type 2 diabetes mellitus with other circulatory complications: Secondary | ICD-10-CM | POA: Diagnosis present

## 2016-09-08 DIAGNOSIS — Z955 Presence of coronary angioplasty implant and graft: Secondary | ICD-10-CM | POA: Diagnosis not present

## 2016-09-08 DIAGNOSIS — E1151 Type 2 diabetes mellitus with diabetic peripheral angiopathy without gangrene: Secondary | ICD-10-CM | POA: Diagnosis present

## 2016-09-08 DIAGNOSIS — I771 Stricture of artery: Secondary | ICD-10-CM | POA: Diagnosis present

## 2016-09-08 DIAGNOSIS — Z8249 Family history of ischemic heart disease and other diseases of the circulatory system: Secondary | ICD-10-CM | POA: Diagnosis not present

## 2016-09-08 DIAGNOSIS — I5031 Acute diastolic (congestive) heart failure: Secondary | ICD-10-CM | POA: Diagnosis present

## 2016-09-08 DIAGNOSIS — E669 Obesity, unspecified: Secondary | ICD-10-CM

## 2016-09-08 DIAGNOSIS — Z6834 Body mass index (BMI) 34.0-34.9, adult: Secondary | ICD-10-CM

## 2016-09-08 DIAGNOSIS — I252 Old myocardial infarction: Secondary | ICD-10-CM | POA: Diagnosis not present

## 2016-09-08 DIAGNOSIS — E785 Hyperlipidemia, unspecified: Secondary | ICD-10-CM | POA: Diagnosis present

## 2016-09-08 DIAGNOSIS — Z833 Family history of diabetes mellitus: Secondary | ICD-10-CM | POA: Diagnosis not present

## 2016-09-08 DIAGNOSIS — I2583 Coronary atherosclerosis due to lipid rich plaque: Secondary | ICD-10-CM | POA: Diagnosis present

## 2016-09-08 DIAGNOSIS — R079 Chest pain, unspecified: Secondary | ICD-10-CM

## 2016-09-08 DIAGNOSIS — R9431 Abnormal electrocardiogram [ECG] [EKG]: Secondary | ICD-10-CM | POA: Diagnosis not present

## 2016-09-08 DIAGNOSIS — Z9049 Acquired absence of other specified parts of digestive tract: Secondary | ICD-10-CM | POA: Diagnosis not present

## 2016-09-08 DIAGNOSIS — T82855A Stenosis of coronary artery stent, initial encounter: Secondary | ICD-10-CM | POA: Diagnosis present

## 2016-09-08 DIAGNOSIS — E782 Mixed hyperlipidemia: Secondary | ICD-10-CM | POA: Diagnosis present

## 2016-09-08 DIAGNOSIS — R0789 Other chest pain: Secondary | ICD-10-CM | POA: Diagnosis present

## 2016-09-08 DIAGNOSIS — I200 Unstable angina: Secondary | ICD-10-CM | POA: Diagnosis not present

## 2016-09-08 DIAGNOSIS — Z9861 Coronary angioplasty status: Secondary | ICD-10-CM | POA: Diagnosis not present

## 2016-09-08 DIAGNOSIS — I2510 Atherosclerotic heart disease of native coronary artery without angina pectoris: Secondary | ICD-10-CM | POA: Diagnosis present

## 2016-09-08 HISTORY — DX: Obesity, unspecified: E66.9

## 2016-09-08 HISTORY — DX: Hyperlipidemia, unspecified: E78.5

## 2016-09-08 LAB — CBC
HCT: 40.9 % (ref 36.0–46.0)
Hemoglobin: 14 g/dL (ref 12.0–15.0)
MCH: 31.6 pg (ref 26.0–34.0)
MCHC: 34.2 g/dL (ref 30.0–36.0)
MCV: 92.3 fL (ref 78.0–100.0)
Platelets: 213 K/uL (ref 150–400)
RBC: 4.43 MIL/uL (ref 3.87–5.11)
RDW: 13.2 % (ref 11.5–15.5)
WBC: 7.6 K/uL (ref 4.0–10.5)

## 2016-09-08 LAB — HEPATIC FUNCTION PANEL
ALT: 19 U/L (ref 14–54)
AST: 17 U/L (ref 15–41)
Albumin: 3.8 g/dL (ref 3.5–5.0)
Alkaline Phosphatase: 80 U/L (ref 38–126)
Bilirubin, Direct: 0.1 mg/dL (ref 0.1–0.5)
Indirect Bilirubin: 0.5 mg/dL (ref 0.3–0.9)
Total Bilirubin: 0.6 mg/dL (ref 0.3–1.2)
Total Protein: 7.1 g/dL (ref 6.5–8.1)

## 2016-09-08 LAB — URINALYSIS, ROUTINE W REFLEX MICROSCOPIC
Bilirubin Urine: NEGATIVE
Glucose, UA: >=500 mg/dL — AB
Hgb urine dipstick: NEGATIVE
Ketones, ur: NEGATIVE mg/dL
Leukocytes, UA: NEGATIVE
Nitrite: NEGATIVE
PH: 6 (ref 5.0–8.0)
Protein, ur: NEGATIVE mg/dL
SPECIFIC GRAVITY, URINE: 1.011 (ref 1.005–1.030)

## 2016-09-08 LAB — BASIC METABOLIC PANEL WITH GFR
Anion gap: 11 (ref 5–15)
BUN: 14 mg/dL (ref 6–20)
CO2: 23 mmol/L (ref 22–32)
Calcium: 9.3 mg/dL (ref 8.9–10.3)
Chloride: 103 mmol/L (ref 101–111)
Creatinine, Ser: 0.79 mg/dL (ref 0.44–1.00)
GFR calc Af Amer: >60 mL/min (ref >60)
GFR calc non Af Amer: >60 mL/min (ref >60)
Glucose, Bld: 252 mg/dL — ABNORMAL HIGH (ref 65–99)
Potassium: 3.6 mmol/L (ref 3.5–5.1)
Sodium: 137 mmol/L (ref 135–145)

## 2016-09-08 LAB — I-STAT TROPONIN, ED: Troponin i, poc: 0 ng/mL (ref 0.00–0.08)

## 2016-09-08 LAB — LIPASE, BLOOD: Lipase: 22 U/L (ref 11–51)

## 2016-09-08 MED ORDER — ASPIRIN 81 MG PO CHEW
324.0000 mg | CHEWABLE_TABLET | Freq: Once | ORAL | Status: AC
  Administered 2016-09-08: 324 mg via ORAL
  Filled 2016-09-08: qty 4

## 2016-09-08 MED ORDER — VENLAFAXINE HCL ER 75 MG PO CP24
75.0000 mg | ORAL_CAPSULE | Freq: Every day | ORAL | Status: DC
  Administered 2016-09-09 – 2016-09-10 (×2): 75 mg via ORAL
  Filled 2016-09-08 (×2): qty 1

## 2016-09-08 MED ORDER — ACETAMINOPHEN 325 MG PO TABS
650.0000 mg | ORAL_TABLET | ORAL | Status: DC | PRN
  Filled 2016-09-08: qty 2

## 2016-09-08 MED ORDER — METOPROLOL TARTRATE 5 MG/5ML IV SOLN
5.0000 mg | Freq: Once | INTRAVENOUS | Status: AC
  Administered 2016-09-08: 5 mg via INTRAVENOUS
  Filled 2016-09-08: qty 5

## 2016-09-08 MED ORDER — LISINOPRIL 5 MG PO TABS
5.0000 mg | ORAL_TABLET | Freq: Every day | ORAL | Status: DC
  Administered 2016-09-09 – 2016-09-10 (×2): 5 mg via ORAL
  Filled 2016-09-08 (×2): qty 1

## 2016-09-08 MED ORDER — IOPAMIDOL (ISOVUE-370) INJECTION 76%
INTRAVENOUS | Status: AC
  Administered 2016-09-08: 100 mL
  Filled 2016-09-08: qty 100

## 2016-09-08 MED ORDER — ASPIRIN 81 MG PO CHEW: 324.0000 mg | CHEWABLE_TABLET | ORAL | Status: DC

## 2016-09-08 MED ORDER — ENOXAPARIN SODIUM 40 MG/0.4ML ~~LOC~~ SOLN
40.0000 mg | Freq: Every day | SUBCUTANEOUS | Status: DC
  Administered 2016-09-09: 40 mg via SUBCUTANEOUS
  Filled 2016-09-08: qty 0.4

## 2016-09-08 MED ORDER — COENZYME Q10 30 MG PO CAPS: 30.0000 mg | ORAL_CAPSULE | Freq: Every day | ORAL | Status: DC

## 2016-09-08 MED ORDER — ATORVASTATIN CALCIUM 80 MG PO TABS: 80.0000 mg | ORAL_TABLET | Freq: Every day | ORAL | Status: DC

## 2016-09-08 MED ORDER — ASPIRIN EC 81 MG PO TBEC
81.0000 mg | DELAYED_RELEASE_TABLET | Freq: Every day | ORAL | Status: DC
  Administered 2016-09-09 – 2016-09-10 (×2): 81 mg via ORAL
  Filled 2016-09-08 (×2): qty 1

## 2016-09-08 MED ORDER — ONDANSETRON HCL 4 MG/2ML IJ SOLN: 4.0000 mg | Freq: Four times a day (QID) | INTRAMUSCULAR | Status: DC | PRN

## 2016-09-08 MED ORDER — NITROGLYCERIN 0.4 MG SL SUBL
0.4000 mg | SUBLINGUAL_TABLET | SUBLINGUAL | Status: DC | PRN
  Administered 2016-09-08: 0.4 mg via SUBLINGUAL
  Filled 2016-09-08: qty 1

## 2016-09-08 MED ORDER — NITROGLYCERIN 0.2 MG/HR TD PT24
0.2000 mg | MEDICATED_PATCH | Freq: Every day | TRANSDERMAL | Status: DC
  Administered 2016-09-09: 0.2 mg via TRANSDERMAL
  Filled 2016-09-08 (×2): qty 1

## 2016-09-08 MED ORDER — METOPROLOL SUCCINATE ER 50 MG PO TB24
50.0000 mg | ORAL_TABLET | Freq: Every morning | ORAL | Status: DC
  Administered 2016-09-09 – 2016-09-10 (×2): 50 mg via ORAL
  Filled 2016-09-08 (×2): qty 1

## 2016-09-08 MED ORDER — INSULIN ASPART 100 UNIT/ML ~~LOC~~ SOLN
8.0000 [IU] | Freq: Three times a day (TID) | SUBCUTANEOUS | Status: DC
  Administered 2016-09-10: 8 [IU] via SUBCUTANEOUS

## 2016-09-08 MED ORDER — ASPIRIN 300 MG RE SUPP: 300.0000 mg | RECTAL | Status: DC

## 2016-09-08 NOTE — ED Provider Notes (Signed)
[facility] DEPT Provider Note   CSN: [id number] Arrival date & time: 09/08/16  1706     History   Chief Complaint Chief Complaint  Patient presents with  . Chest Pain  . Nausea    HPI Cassie Cordova is a 61 y.o. female.  HPI Patient states she developed left sided thoracic back pain around 2 PM today. The pain radiated to her right neck and jaw. Associated with nausea which is since resolved. Describes the pain as 3/10 currently. Patient has history of coronary artery disease. Had MI in 2012 requiring stent placement. States she had similar symptoms and time. Scheduled to undergo catheterization on Monday with Dr. Gwenlyn Found. Past Medical History:  Diagnosis Date  . CAD (coronary artery disease)   . CHF (congestive heart failure) ([place])   . Diabetes mellitus, type II ([place])   . Hypertension     Patient Active Problem List   Diagnosis Date Noted  . Unstable angina ([place]) 09/08/2016  . Coronary artery disease due to lipid rich plaque 09/06/2016  . Personal history of noncompliance with medical treatment, presenting hazards to health 04/04/2016  . Type 2 diabetes mellitus with vascular disease ([place]) 04/22/2015  . Hyperlipidemia 04/22/2015  . Essential hypertension, benign 04/22/2015  . Vitamin D deficiency 04/22/2015  . ANAL SPASM 11/10/2008    Past Surgical History:  Procedure Laterality Date  . ABDOMINAL HYSTERECTOMY    . CESAREAN SECTION    . CHOLECYSTECTOMY      OB History    Gravida Para Term Preterm AB Living   '3 3 3         '$ SAB TAB Ectopic Multiple Live Births                   Home Medications    Prior to Admission medications   Medication Sig Start Date End Date Taking? Authorizing Provider  acetaminophen (TYLENOL) 500 MG tablet Take 500-1,000 mg by mouth every 6 (six) hours as needed (for pain).   Yes Historical Provider, MD  aspirin EC 81 MG tablet Take 81 mg by mouth daily.   Yes Historical Provider, MD  atorvastatin (LIPITOR) 20 MG tablet Take  20 mg by mouth every morning.    Yes Historical Provider, MD  blood glucose meter kit and supplies KIT Dispense based on patient and insurance preference. Use up to four times daily as directed. (FOR ICD-10 E11.65) 04/06/16  Yes Cassandria Anger, MD  Blood Glucose Monitoring Suppl (FREESTYLE LITE) DEVI 1 to test glucose 4 times a day 04/04/16  Yes Cassandria Anger, MD  co-enzyme Q-10 30 MG capsule Take 30 mg by mouth daily.   Yes Historical Provider, MD  Cranberry-Vitamin C-Vitamin E 4200-20-3 MG-MG-UNIT CAPS Take 1 capsule by mouth daily.   Yes Historical Provider, MD  Insulin Glargine (TOUJEO SOLOSTAR) 300 UNIT/ML SOPN Inject 40 Units into the skin at bedtime. Patient taking differently: Inject 35 Units into the skin at bedtime.  04/04/16  Yes Cassandria Anger, MD  insulin lispro (HUMALOG) 100 UNIT/ML injection Inject 0.08-0.14 mLs (8-14 Units total) into the skin 3 (three) times daily before meals. 05/20/16  Yes Cassandria Anger, MD  INSULIN SYRINGE .5CC/29G 29G X 1/2" 0.5 ML MISC Use 3 times to inject insulin 05/20/16  Yes Cassandria Anger, MD  isosorbide mononitrate (IMDUR) 30 MG 24 hr tablet Take 1 tablet (30 mg total) by mouth daily. 09/07/16 12/06/16 Yes Lorretta Harp, MD  Lancets (FREESTYLE) lancets Use as instructed 04/04/16  Yes Cassandria Anger, MD  lisinopril (PRINIVIL,ZESTRIL) 5 MG tablet TAKE 1 BY MOUTH DAILY Patient taking differently: Take 5 mg by mouth once a day 02/10/16  Yes Cassandria Anger, MD  metFORMIN (GLUCOPHAGE-XR) 500 MG 24 hr tablet Take 2 tablets (1,000 mg total) by mouth daily with breakfast. 07/20/16  Yes Cassandria Anger, MD  metoprolol succinate (TOPROL-XL) 25 MG 24 hr tablet Take 25 mg by mouth every morning. 07/22/16  Yes Historical Provider, MD  nitroGLYCERIN (NITROSTAT) 0.4 MG SL tablet Place 0.4 mg under the tongue every 5 (five) minutes as needed for chest pain.   Yes Historical Provider, MD  venlafaxine XR (EFFEXOR-XR) 75 MG 24 hr  capsule Take 75 mg by mouth daily with breakfast.   Yes Historical Provider, MD  metoprolol succinate (TOPROL-XL) 50 MG 24 hr tablet Take 1 tablet (50 mg total) by mouth every morning. 09/07/16   Lorretta Harp, MD    Family History Family History  Problem Relation Age of Onset  . Heart attack Other   . Diabetes Other     Social History Social History  Substance Use Topics  . Smoking status: Never Smoker  . Smokeless tobacco: Never Used  . Alcohol use No     Allergies   Patient has no known allergies.   Review of Systems Review of Systems  Constitutional: Negative for chills and fever.  Respiratory: Negative for cough and shortness of breath.   Cardiovascular: Negative for chest pain, palpitations and leg swelling.  Gastrointestinal: Positive for nausea. Negative for abdominal pain, constipation, diarrhea and vomiting.  Musculoskeletal: Positive for back pain and neck pain. Negative for myalgias and neck stiffness.  Skin: Negative for rash and wound.  Neurological: Negative for dizziness, syncope, weakness, light-headedness and numbness.  All other systems reviewed and are negative.    Physical Exam Updated Vital Signs BP (!) 170/79   Pulse 69   Temp 98.8 F (37.1 C) (Oral)   Resp 19   Ht '5\' 1"'$  (1.549 m)   Wt 177 lb (80.3 kg)   SpO2 93%   BMI 33.44 kg/m   Physical Exam  Constitutional: She is oriented to person, place, and time. She appears well-developed and well-nourished. No distress.  HENT:  Head: Normocephalic and atraumatic.  Mouth/Throat: Oropharynx is clear and moist. No oropharyngeal exudate.  Eyes: EOM are normal. Pupils are equal, round, and reactive to light.  Neck: Normal range of motion. Neck supple. No JVD present.  No meningismus  Cardiovascular: Normal rate and regular rhythm.  Exam reveals no gallop and no friction rub.   No murmur heard. Pulmonary/Chest: Effort normal and breath sounds normal. No respiratory distress. She has no wheezes.  She has no rales. She exhibits no tenderness.  Abdominal: Soft. Bowel sounds are normal. There is no tenderness. There is no rebound and no guarding.  Musculoskeletal: Normal range of motion. She exhibits no edema or tenderness.  No midline or muscular thoracic or lumbar tenderness. No CVA tenderness bilaterally. No lower extremity swelling or asymmetry. 2+ distal pulses in all extremities.  Lymphadenopathy:    She has no cervical adenopathy.  Neurological: She is alert and oriented to person, place, and time.  Moving all extremities without deficit. Sensation fully intact.  Skin: Skin is warm and dry. Capillary refill takes less than 2 seconds. No rash noted. No erythema.  Psychiatric: She has a normal mood and affect. Her behavior is normal.  Nursing note and vitals reviewed.    ED Treatments /  Results  Labs (all labs ordered are listed, but only abnormal results are displayed) Labs Reviewed  BASIC METABOLIC PANEL - Abnormal; Notable for the following:       Result Value   Glucose, Bld 252 (*)    All other components within normal limits  URINALYSIS, ROUTINE W REFLEX MICROSCOPIC - Abnormal; Notable for the following:    Glucose, UA >=500 (*)    Bacteria, UA FEW (*)    Squamous Epithelial / LPF 0-5 (*)    All other components within normal limits  MRSA PCR SCREENING  CBC  HEPATIC FUNCTION PANEL  LIPASE, BLOOD  HIV ANTIBODY (ROUTINE TESTING)  CBC  CREATININE, SERUM  TSH  T4, FREE  TROPONIN I  TROPONIN I  TROPONIN I  BASIC METABOLIC PANEL  CBC  PROTIME-INR  I-STAT TROPOININ, ED    EKG  EKG Interpretation  Date/Time:  Thursday September 08 2016 22:17:23 EDT Ventricular Rate:  62 PR Interval:    QRS Duration: 97 QT Interval:  457 QTC Calculation: 465 R Axis:   10 Text Interpretation:  Sinus rhythm Borderline T abnormalities, inferior leads Confirmed by Ranae Palms  MD, Griselda Tosh ([id number]) on 09/09/2016 12:11:15 AM       Radiology Dg Chest 2 View  Result Date:  09/08/2016 CLINICAL DATA:  Left-sided posterior chest pain and shortness of breath 4 hours. EXAM: CHEST  2 VIEW COMPARISON:  09/06/2016 FINDINGS: Lungs are adequately inflated and otherwise clear. Cardiomediastinal silhouette and remainder of the exam is unchanged. IMPRESSION: No active cardiopulmonary disease. Electronically Signed   By: Elberta Fortis M.D.   On: 09/08/2016 18:07   Ct Angio Chest Aorta W And/or Wo Contrast  Result Date: 09/08/2016 CLINICAL DATA:  Chest pain/ pressure radiating to back and right arm today with nausea. EXAM: CT ANGIOGRAPHY CHEST WITH CONTRAST TECHNIQUE: Multidetector CT imaging of the chest was performed using the standard protocol during bolus administration of intravenous contrast. Multiplanar CT image reconstructions and MIPs were obtained to evaluate the vascular anatomy. CONTRAST:  100 mL Isovue 370 IV. COMPARISON:  CT abdomen 05/27/2016 and chest x-ray today. FINDINGS: Cardiovascular: Borderline cardiomegaly. Minimal calcified plaque over the left main and left anterior descending coronary arteries. No evidence of pulmonary embolism. Thoracic aorta is unremarkable. Mediastinum/Nodes: No significant mediastinal or hilar adenopathy. Remaining mediastinal structures are within normal. Lungs/Pleura: Lungs are adequately inflated with subtle dependent bibasilar atelectasis. No focal consolidation or effusion. Airways are within normal. Upper Abdomen: Previous cholecystectomy. Musculoskeletal: Mild degenerate change of the spine. Review of the MIP images confirms the above findings. IMPRESSION: No evidence of pulmonary embolism. No acute cardiopulmonary disease. Mild cardiomegaly. Electronically Signed   By: Elberta Fortis M.D.   On: 09/08/2016 21:19    Procedures Procedures (including critical care time)  Medications Ordered in ED Medications  nitroGLYCERIN (NITROSTAT) SL tablet 0.4 mg (0.4 mg Sublingual Given 09/08/16 1845)  metoprolol succinate (TOPROL-XL) 24 hr tablet  50 mg (not administered)  venlafaxine XR (EFFEXOR-XR) 24 hr capsule 75 mg (not administered)  insulin aspart (novoLOG) injection 8 Units (not administered)  aspirin EC tablet 81 mg (not administered)  lisinopril (PRINIVIL,ZESTRIL) tablet 5 mg (not administered)  atorvastatin (LIPITOR) tablet 80 mg (not administered)  aspirin chewable tablet 324 mg (not administered)    Or  aspirin suppository 300 mg (not administered)  acetaminophen (TYLENOL) tablet 650 mg (not administered)  ondansetron (ZOFRAN) injection 4 mg (not administered)  enoxaparin (LOVENOX) injection 40 mg (not administered)  nitroGLYCERIN (NITRODUR - Dosed in mg/24 hr) patch 0.2  mg (not administered)  0.9% sodium chloride infusion (not administered)    Followed by  0.9% sodium chloride infusion (not administered)  sodium chloride flush (NS) 0.9 % injection 3 mL (not administered)  aspirin chewable tablet 81 mg (not administered)  metoprolol (LOPRESSOR) injection 5 mg (5 mg Intravenous Given 09/08/16 1906)  aspirin chewable tablet 324 mg (324 mg Oral Given 09/08/16 1850)  iopamidol (ISOVUE-370) 76 % injection (100 mLs  Contrast Given 09/08/16 2041)     Initial Impression / Assessment and Plan / ED Course  I have reviewed the triage vital signs and the nursing notes.  Pertinent labs & imaging results that were available during my care of the patient were reviewed by me and considered in my medical decision making (see chart for details).    Patient's blood pressure noted to be elevated. Given metoprolol, aspirin and nitroglycerin. Improvement of the blood pressure. Given thoracic back pain will get CT angiogram to rule out dissection. Will likely need admission  CT angiogram is negative for dissection. Patient's pain is completely resolved. Discussed with cardiology and will see patient in the emergency department.  Cardiology will admit. Final Clinical Impressions(s) / ED Diagnoses   Final diagnoses:  Chest pain,  unspecified type    New Prescriptions Current Discharge Medication List       Julianne Rice, MD 09/09/16 [phone]

## 2016-09-08 NOTE — H&P (Signed)
History & Physical    Patient ID: Cassie Cordova MRN: [id number], DOB/AGE: Oct 31, 1955   Admit date: 09/08/2016   Primary Physician: Asencion Noble, MD Primary Cardiologist: Dr. Gwenlyn Found  Patient Profile    80 y o woman with CP  Past Medical History    Past Medical History:  Diagnosis Date  . CAD (coronary artery disease)   . CHF (congestive heart failure) ([place])   . Diabetes mellitus, type II ([place])   . Hypertension     Past Surgical History:  Procedure Laterality Date  . ABDOMINAL HYSTERECTOMY    . CESAREAN SECTION    . CHOLECYSTECTOMY       Allergies  No Known Allergies  History of Present Illness    Cassie Cordova is a delightful 61 year old woman. She recently established care with Dr. Gwenlyn Found. 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 She underwent cardiac catheterization and stenting of her proximal LAD with a Xience 5 drug-eluting stent (2.5 mm/23 mm). 3 days later she had a staged intervention on her left PDA with a integrity resolute drug-eluting stent (2.25 mm x 12 mm). Her other problems include treated hypertension, diabetes and hyperlipidemia. Over the last 2-3 months she's noticed increasing dyspnea on exertion and substernal chest pressure. Those episodes did occasionally occur at rest as well. Today she presents from home after her kids pressured her to come ine given concerns that she has progressive pain symptoms. Her pain intensity increased to a maximum, (elephant on the chest). Pain radiated to the back. Pain felt like the one prior to last MI.   In the ED BP 200 syst. Still in pain. CT chest neg. First set of trop neg. That's now her 2nd visit to ED since Dec last year for CP. She was scheduled to undergo elective cth with Dr, Gwenlyn Found on Monday, Family would like her to stay and get cathed sooner.    Home Medications    Prior to Admission  medications   Medication Sig Start Date End Date Taking? Authorizing Provider  acetaminophen (TYLENOL) 500 MG tablet Take 500-1,000 mg by mouth every 6 (six) hours as needed (for pain).   Yes Historical Provider, MD  aspirin EC 81 MG tablet Take 81 mg by mouth daily.   Yes Historical Provider, MD  atorvastatin (LIPITOR) 20 MG tablet Take 20 mg by mouth every morning.    Yes Historical Provider, MD  blood glucose meter kit and supplies KIT Dispense based on patient and insurance preference. Use up to four times daily as directed. (FOR ICD-10 E11.65) 04/06/16  Yes Cassandria Anger, MD  Blood Glucose Monitoring Suppl (FREESTYLE LITE) DEVI 1 to test glucose 4 times a day 04/04/16  Yes Cassandria Anger, MD  co-enzyme Q-10 30 MG capsule Take 30 mg by mouth daily.   Yes Historical Provider, MD  Cranberry-Vitamin C-Vitamin E 4200-20-3 MG-MG-UNIT CAPS Take 1 capsule by mouth daily.   Yes Historical Provider, MD  Insulin Glargine (TOUJEO SOLOSTAR) 300 UNIT/ML SOPN Inject 40 Units into the skin at bedtime. Patient taking differently: Inject 35 Units into the skin at bedtime.  04/04/16  Yes Cassandria Anger, MD  insulin lispro (HUMALOG) 100 UNIT/ML injection Inject 0.08-0.14 mLs (8-14 Units total) into the skin 3 (three) times daily before meals. 05/20/16  Yes Cassandria Anger, MD  INSULIN SYRINGE .5CC/29G 29G X 1/2" 0.5 ML MISC Use 3 times  to inject insulin 05/20/16  Yes Cassandria Anger, MD  isosorbide mononitrate (IMDUR) 30 MG 24 hr tablet Take 1 tablet (30 mg total) by mouth daily. 09/07/16 12/06/16 Yes Lorretta Harp, MD  Lancets (FREESTYLE) lancets Use as instructed 04/04/16  Yes Cassandria Anger, MD  lisinopril (PRINIVIL,ZESTRIL) 5 MG tablet TAKE 1 BY MOUTH DAILY Patient taking differently: Take 5 mg by mouth once a day 02/10/16  Yes Cassandria Anger, MD  metFORMIN (GLUCOPHAGE-XR) 500 MG 24 hr tablet Take 2 tablets (1,000 mg total) by mouth daily with breakfast. 07/20/16  Yes  Cassandria Anger, MD  metoprolol succinate (TOPROL-XL) 25 MG 24 hr tablet Take 25 mg by mouth every morning. 07/22/16  Yes Historical Provider, MD  nitroGLYCERIN (NITROSTAT) 0.4 MG SL tablet Place 0.4 mg under the tongue every 5 (five) minutes as needed for chest pain.   Yes Historical Provider, MD  venlafaxine XR (EFFEXOR-XR) 75 MG 24 hr capsule Take 75 mg by mouth daily with breakfast.   Yes Historical Provider, MD  metoprolol succinate (TOPROL-XL) 50 MG 24 hr tablet Take 1 tablet (50 mg total) by mouth every morning. 09/07/16   Lorretta Harp, MD    Family History    Family History  Problem Relation Age of Onset  . Heart attack Other   . Diabetes Other     Social History    Social History   Social History  . Marital status: Married    Spouse name: N/A  . Number of children: N/A  . Years of education: N/A   Occupational History  . Not on file.   Social History Main Topics  . Smoking status: Never Smoker  . Smokeless tobacco: Never Used  . Alcohol use No  . Drug use: No  . Sexual activity: Not on file   Other Topics Concern  . Not on file   Social History Narrative  . No narrative on file     Review of Systems    General:  No chills, fever, night sweats or weight changes.  Cardiovascular:  No chest pain, dyspnea on exertion, edema, orthopnea, palpitations, paroxysmal nocturnal dyspnea. Dermatological: No rash, lesions/masses Respiratory: No cough, dyspnea Urologic: No hematuria, dysuria Abdominal:   No nausea, vomiting, diarrhea, bright red blood per rectum, melena, or hematemesis Neurologic:  No visual changes, wkns, changes in mental status. All other systems reviewed and are otherwise negative except as noted above.  Physical Exam    Blood pressure (!) 169/74, pulse 69, temperature 98.8 F (37.1 C), temperature source Oral, resp. rate (!) 22, height '5\' 1"'$  (1.549 m), weight 80.3 kg (177 lb), SpO2 95 %.  General: Pleasant, NAD Psych: Normal  affect. Neuro: Alert and oriented X 3. Moves all extremities spontaneously. HEENT: Normal  Neck: Supple without bruits or JVD. Lungs:  Resp regular and unlabored, CTA. Heart: RRR no s3, s4, or murmurs. Abdomen: Soft, non-tender, non-distended, BS + x 4.  Extremities: No clubbing, cyanosis or edema. DP/PT/Radials 2+ and equal bilaterally.  Labs    Troponin [facility] of Care Test)  Recent Labs  09/08/16 1727  TROPIPOC 0.00   No results for input(s): CKTOTAL, CKMB, TROPONINI in the last 72 hours. Lab Results  Component Value Date   WBC 7.6 09/08/2016   HGB 14.0 09/08/2016   HCT 40.9 09/08/2016   MCV 92.3 09/08/2016   PLT 213 09/08/2016    Recent Labs Lab 09/08/16 1717 09/08/16 1827  NA 137  --   K 3.6  --  CL 103  --   CO2 23  --   BUN 14  --   CREATININE 0.79  --   CALCIUM 9.3  --   PROT  --  7.1  BILITOT  --  0.6  ALKPHOS  --  80  ALT  --  19  AST  --  17  GLUCOSE 252*  --    No results found for: CHOL, HDL, LDLCALC, TRIG No results found for: [facility]   Radiology Studies    Dg Chest 2 View  Result Date: 09/08/2016 CLINICAL DATA:  Left-sided posterior chest pain and shortness of breath 4 hours. EXAM: CHEST  2 VIEW COMPARISON:  09/06/2016 FINDINGS: Lungs are adequately inflated and otherwise clear. Cardiomediastinal silhouette and remainder of the exam is unchanged. IMPRESSION: No active cardiopulmonary disease. Electronically Signed   By: Marin Olp M.D.   On: 09/08/2016 18:07   Dg Chest 2 View  Result Date: 09/06/2016 CLINICAL DATA:  Pre op testing for cardiac cath procedure, chest pains, s.o.b, Rt arm pain, jaw pain, past hx of CAD, hx of stents in place EXAM: CHEST  2 VIEW COMPARISON:  None. FINDINGS: Normal cardiac silhouette and mediastinal contours given slightly reduced lung volumes. Minimal bibasilar opacities favored to represent atelectasis. No discrete focal airspace opacities. No pleural effusion or pneumothorax. There is a minimal amount of  pleuroparenchymal thickening about the peripheral aspect the right minor as well as the bilateral major fissures. No evidence of edema. No acute osseus abnormalities. Post cholecystectomy. IMPRESSION: No acute cardiopulmonary disease on this mildly hypoventilated examination. Electronically Signed   By: Sandi Mariscal M.D.   On: 09/06/2016 14:06   Ct Angio Chest Aorta W And/or Wo Contrast  Result Date: 09/08/2016 CLINICAL DATA:  Chest pain/ pressure radiating to back and right arm today with nausea. EXAM: CT ANGIOGRAPHY CHEST WITH CONTRAST TECHNIQUE: Multidetector CT imaging of the chest was performed using the standard protocol during bolus administration of intravenous contrast. Multiplanar CT image reconstructions and MIPs were obtained to evaluate the vascular anatomy. CONTRAST:  100 mL Isovue 370 IV. COMPARISON:  CT abdomen 05/27/2016 and chest x-ray today. FINDINGS: Cardiovascular: Borderline cardiomegaly. Minimal calcified plaque over the left main and left anterior descending coronary arteries. No evidence of pulmonary embolism. Thoracic aorta is unremarkable. Mediastinum/Nodes: No significant mediastinal or hilar adenopathy. Remaining mediastinal structures are within normal. Lungs/Pleura: Lungs are adequately inflated with subtle dependent bibasilar atelectasis. No focal consolidation or effusion. Airways are within normal. Upper Abdomen: Previous cholecystectomy. Musculoskeletal: Mild degenerate change of the spine. Review of the MIP images confirms the above findings. IMPRESSION: No evidence of pulmonary embolism. No acute cardiopulmonary disease. Mild cardiomegaly. Electronically Signed   By: Marin Olp M.D.   On: 09/08/2016 21:19    ECG & Cardiac Imaging    NSR, t wave flattening in limb leads and lateral leads. Possible some minor ST depression in lateral leads  Assessment & Plan    Cassie Cordova, has a PMH of CAD, HTN and DM. She now presents with progressive CP (UA). She is scheduled for  cath on Monday. Given her symptoms it is reasonable to admit now for potential cath tomorrow. I think the concern of her children is justified given change in quality and quantity of pain.   Plan: - nitro paste - home ASA - home metop - cycle trop - npo for possible cath  Signed, Cristina Gong, MD 09/08/2016, 10:25 PM

## 2016-09-08 NOTE — ED Triage Notes (Signed)
Pt. Stated, I started having chest pain today with some nausea and tingling in my arm and fingers.

## 2016-09-08 NOTE — Progress Notes (Signed)
PHARMACIST - PHYSICIAN ORDER COMMUNICATION  CONCERNING: P&T Medication Policy on Herbal Medications  DESCRIPTION:  This patient's order for:  Co-Q 10  has been noted.  This product(s) is classified as an "herbal" or natural product. Due to a lack of definitive safety studies or FDA approval, nonstandard manufacturing practices, plus the potential risk of unknown drug-drug interactions while on inpatient medications, the Pharmacy and Therapeutics Committee does not permit the use of "herbal" or natural products of this type within [facility].   ACTION TAKEN: The pharmacy department is unable to verify this order at this time and your patient has been informed of this safety policy. Please reevaluate patient's clinical condition at discharge and address if the herbal or natural product(s) should be resumed at that time.  Christoper Fabianaron Naliyah Neth, PharmD, BCPS Clinical pharmacist, pager [phone] 09/08/2016 11:41 PM

## 2016-09-08 NOTE — ED Triage Notes (Signed)
Pt's husband stated shes had one Nitro at 320pm. , it helped some and then came back.

## 2016-09-08 NOTE — ED Notes (Signed)
Patient transported to X-ray 

## 2016-09-08 NOTE — ED Notes (Signed)
Patient transported to CT 

## 2016-09-08 NOTE — ED Triage Notes (Signed)
Pt. Is scheduled for a heart catherization this Monday with Dr. Allyson SabalBerry.

## 2016-09-09 ENCOUNTER — Other Ambulatory Visit: Payer: Self-pay

## 2016-09-09 ENCOUNTER — Inpatient Hospital Stay (HOSPITAL_COMMUNITY): Payer: BLUE CROSS/BLUE SHIELD

## 2016-09-09 ENCOUNTER — Encounter (HOSPITAL_COMMUNITY)
Admission: EM | Disposition: A | Discharge status: Home / Self Care | Payer: Self-pay | Source: Home / Self Care | Attending: Cardiovascular Disease

## 2016-09-09 DIAGNOSIS — I25110 Atherosclerotic heart disease of native coronary artery with unstable angina pectoris: Principal | ICD-10-CM

## 2016-09-09 DIAGNOSIS — R9431 Abnormal electrocardiogram [ECG] [EKG]: Secondary | ICD-10-CM

## 2016-09-09 HISTORY — PX: CORONARY STENT INTERVENTION: CATH118234

## 2016-09-09 HISTORY — PX: LEFT HEART CATH AND CORONARY ANGIOGRAPHY: CATH118249

## 2016-09-09 LAB — ECHOCARDIOGRAM COMPLETE
Height: 61 in
Weight: 2894.4 oz

## 2016-09-09 LAB — TROPONIN I: Troponin I: <0.03 ng/mL (ref <0.03)

## 2016-09-09 LAB — CBC
HCT: 38.5 % (ref 36.0–46.0)
HEMATOCRIT: 40.3 % (ref 36.0–46.0)
HEMOGLOBIN: 13.1 g/dL (ref 12.0–15.0)
HEMOGLOBIN: 13.7 g/dL (ref 12.0–15.0)
MCH: 31.4 pg (ref 26.0–34.0)
MCH: 31.7 pg (ref 26.0–34.0)
MCHC: 34 g/dL (ref 30.0–36.0)
MCHC: 34 g/dL (ref 30.0–36.0)
MCV: 92.3 fL (ref 78.0–100.0)
MCV: 93.3 fL (ref 78.0–100.0)
PLATELETS: 201 10*3/uL (ref 150–400)
Platelets: 197 10*3/uL (ref 150–400)
RBC: 4.17 MIL/uL (ref 3.87–5.11)
RBC: 4.32 MIL/uL (ref 3.87–5.11)
RDW: 13.3 % (ref 11.5–15.5)
RDW: 13.4 % (ref 11.5–15.5)
WBC: 5.6 10*3/uL (ref 4.0–10.5)
WBC: 6.3 10*3/uL (ref 4.0–10.5)

## 2016-09-09 LAB — BASIC METABOLIC PANEL
ANION GAP: 10 (ref 5–15)
BUN: 13 mg/dL (ref 6–20)
CO2: 26 mmol/L (ref 22–32)
Calcium: 9.1 mg/dL (ref 8.9–10.3)
Chloride: 104 mmol/L (ref 101–111)
Creatinine, Ser: 0.79 mg/dL (ref 0.44–1.00)
GFR calc Af Amer: >60 mL/min (ref >60)
GLUCOSE: 225 mg/dL — AB (ref 65–99)
POTASSIUM: 3.8 mmol/L (ref 3.5–5.1)
Sodium: 140 mmol/L (ref 135–145)

## 2016-09-09 LAB — MRSA PCR SCREENING: MRSA BY PCR: NEGATIVE

## 2016-09-09 LAB — PROTIME-INR
INR: 1.01
Prothrombin Time: 13.3 seconds (ref 11.4–15.2)

## 2016-09-09 LAB — GLUCOSE, CAPILLARY
GLUCOSE-CAPILLARY: 168 mg/dL — AB (ref 65–99)
Glucose-Capillary: 181 mg/dL — ABNORMAL HIGH (ref 65–99)

## 2016-09-09 LAB — HIV ANTIBODY (ROUTINE TESTING W REFLEX): HIV Screen 4th Generation wRfx: NONREACTIVE

## 2016-09-09 LAB — TSH: TSH: 2.222 u[IU]/mL (ref 0.350–4.500)

## 2016-09-09 LAB — CREATININE, SERUM
CREATININE: 0.75 mg/dL (ref 0.44–1.00)
GFR calc non Af Amer: >60 mL/min (ref >60)

## 2016-09-09 LAB — POCT ACTIVATED CLOTTING TIME
Activated Clotting Time: 301 seconds
Activated Clotting Time: 406 seconds

## 2016-09-09 LAB — T4, FREE: FREE T4: 0.96 ng/dL (ref 0.61–1.12)

## 2016-09-09 SURGERY — LEFT HEART CATH AND CORONARY ANGIOGRAPHY
Anesthesia: LOCAL

## 2016-09-09 MED ORDER — HEPARIN (PORCINE) IN NACL 2-0.9 UNIT/ML-% IJ SOLN
INTRAMUSCULAR | Status: AC
  Filled 2016-09-09: qty 1000

## 2016-09-09 MED ORDER — SODIUM CHLORIDE 0.9 % IV SOLN
INTRAVENOUS | Status: AC
  Administered 2016-09-09: 19:00:00 via INTRAVENOUS

## 2016-09-09 MED ORDER — HEPARIN SODIUM (PORCINE) 1000 UNIT/ML IJ SOLN
INTRAMUSCULAR | Status: AC
  Filled 2016-09-09: qty 1

## 2016-09-09 MED ORDER — TICAGRELOR 90 MG PO TABS
ORAL_TABLET | ORAL | Status: DC | PRN
  Administered 2016-09-09: 180 mg via ORAL

## 2016-09-09 MED ORDER — FENTANYL CITRATE (PF) 100 MCG/2ML IJ SOLN
INTRAMUSCULAR | Status: DC | PRN
  Administered 2016-09-09 (×3): 50 ug via INTRAVENOUS

## 2016-09-09 MED ORDER — SODIUM CHLORIDE 0.9 % IV SOLN
INTRAVENOUS | Status: DC
  Administered 2016-09-09: 10:00:00 via INTRAVENOUS

## 2016-09-09 MED ORDER — SODIUM CHLORIDE 0.9 % WEIGHT BASED INFUSION: 3.0000 mL/kg/h | INTRAVENOUS | Status: DC

## 2016-09-09 MED ORDER — LIDOCAINE HCL (PF) 1 % IJ SOLN
INTRAMUSCULAR | Status: AC
  Filled 2016-09-09: qty 30

## 2016-09-09 MED ORDER — IOPAMIDOL (ISOVUE-370) INJECTION 76%
INTRAVENOUS | Status: AC
Start: 2016-09-09 — End: 2016-09-09
  Filled 2016-09-09: qty 100

## 2016-09-09 MED ORDER — TICAGRELOR 90 MG PO TABS
ORAL_TABLET | ORAL | Status: AC
  Filled 2016-09-09: qty 2

## 2016-09-09 MED ORDER — FENTANYL CITRATE (PF) 100 MCG/2ML IJ SOLN
INTRAMUSCULAR | Status: AC
  Filled 2016-09-09: qty 2

## 2016-09-09 MED ORDER — MIDAZOLAM HCL 2 MG/2ML IJ SOLN
INTRAMUSCULAR | Status: AC
  Filled 2016-09-09: qty 2

## 2016-09-09 MED ORDER — NITROGLYCERIN 1 MG/10 ML FOR IR/CATH LAB
INTRA_ARTERIAL | Status: AC
Start: 2016-09-09 — End: 2016-09-09
  Filled 2016-09-09: qty 10

## 2016-09-09 MED ORDER — MIDAZOLAM HCL 2 MG/2ML IJ SOLN
INTRAMUSCULAR | Status: DC | PRN
  Administered 2016-09-09: 2 mg via INTRAVENOUS

## 2016-09-09 MED ORDER — SODIUM CHLORIDE 0.9% FLUSH: 3.0000 mL | INTRAVENOUS | Status: DC | PRN

## 2016-09-09 MED ORDER — HYDRALAZINE HCL 20 MG/ML IJ SOLN: 5.0000 mg | INTRAMUSCULAR | Status: AC | PRN

## 2016-09-09 MED ORDER — NITROGLYCERIN 1 MG/10 ML FOR IR/CATH LAB
INTRA_ARTERIAL | Status: DC | PRN
  Administered 2016-09-09 (×2): 200 ug via INTRACORONARY

## 2016-09-09 MED ORDER — ASPIRIN 81 MG PO CHEW: 81.0000 mg | CHEWABLE_TABLET | ORAL | Status: DC

## 2016-09-09 MED ORDER — HEPARIN SODIUM (PORCINE) 1000 UNIT/ML IJ SOLN
INTRAMUSCULAR | Status: DC | PRN
  Administered 2016-09-09: 4000 [IU] via INTRAVENOUS
  Administered 2016-09-09: 2000 [IU] via INTRAVENOUS
  Administered 2016-09-09: 6000 [IU] via INTRAVENOUS

## 2016-09-09 MED ORDER — TICAGRELOR 90 MG PO TABS
90.0000 mg | ORAL_TABLET | Freq: Two times a day (BID) | ORAL | Status: DC
  Administered 2016-09-10: 90 mg via ORAL
  Filled 2016-09-09: qty 1

## 2016-09-09 MED ORDER — IOPAMIDOL (ISOVUE-370) INJECTION 76%
INTRAVENOUS | Status: AC
  Filled 2016-09-09: qty 50

## 2016-09-09 MED ORDER — SODIUM CHLORIDE 0.9% FLUSH
3.0000 mL | Freq: Two times a day (BID) | INTRAVENOUS | Status: DC
  Administered 2016-09-10: 3 mL via INTRAVENOUS

## 2016-09-09 MED ORDER — VERAPAMIL HCL 2.5 MG/ML IV SOLN
INTRAVENOUS | Status: AC
  Filled 2016-09-09: qty 2

## 2016-09-09 MED ORDER — HEPARIN (PORCINE) IN NACL 2-0.9 UNIT/ML-% IJ SOLN
INTRAMUSCULAR | Status: DC | PRN
  Administered 2016-09-09: 1000 mL

## 2016-09-09 MED ORDER — IOPAMIDOL (ISOVUE-370) INJECTION 76%
INTRAVENOUS | Status: AC
  Filled 2016-09-09: qty 100

## 2016-09-09 MED ORDER — IOPAMIDOL (ISOVUE-370) INJECTION 76%
INTRAVENOUS | Status: DC | PRN
  Administered 2016-09-09: 250 mL via INTRA_ARTERIAL

## 2016-09-09 MED ORDER — TICAGRELOR 90 MG PO TABS: 90.0000 mg | ORAL_TABLET | Freq: Two times a day (BID) | ORAL | Status: DC

## 2016-09-09 MED ORDER — SODIUM CHLORIDE 0.9 % IV SOLN: 250.0000 mL | INTRAVENOUS | Status: DC | PRN

## 2016-09-09 MED ORDER — LIDOCAINE HCL (PF) 1 % IJ SOLN
INTRAMUSCULAR | Status: DC | PRN
  Administered 2016-09-09: 2 mL

## 2016-09-09 MED ORDER — HEPARIN (PORCINE) IN NACL 2-0.9 UNIT/ML-% IJ SOLN
INTRAMUSCULAR | Status: DC | PRN
  Administered 2016-09-09: 16:00:00 via INTRA_ARTERIAL

## 2016-09-09 MED ORDER — SODIUM CHLORIDE 0.9 % WEIGHT BASED INFUSION: 1.0000 mL/kg/h | INTRAVENOUS | Status: DC

## 2016-09-09 MED ORDER — LABETALOL HCL 5 MG/ML IV SOLN: 10.0000 mg | INTRAVENOUS | Status: AC | PRN

## 2016-09-09 SURGICAL SUPPLY — 24 items
BALLN EUPHORA RX 2.0X10 (BALLOONS) ×2
BALLN EUPHORA RX 2.0X15 (BALLOONS) ×2
BALLN ~~LOC~~ EUPHORA RX 2.5X15 (BALLOONS) ×2
BALLOON EUPHORA RX 2.0X10 (BALLOONS) IMPLANT
BALLOON EUPHORA RX 2.0X15 (BALLOONS) IMPLANT
BALLOON ~~LOC~~ EUPHORA RX 2.5X15 (BALLOONS) IMPLANT
CATH EXPO 5F FL3.5 (CATHETERS) ×1 IMPLANT
CATH EXPO 5FR ANG PIGTAIL 145 (CATHETERS) ×1 IMPLANT
CATH EXPO 5FR FR4 (CATHETERS) ×1 IMPLANT
CATH VISTA GUIDE 6FR XBLAD3.5 (CATHETERS) ×1 IMPLANT
DEVICE RAD COMP TR BAND LRG (VASCULAR PRODUCTS) ×1 IMPLANT
GLIDESHEATH SLEND SS 6F .021 (SHEATH) ×1 IMPLANT
GUIDEWIRE INQWIRE 1.5J.035X260 (WIRE) IMPLANT
INQWIRE 1.5J .035X260CM (WIRE) ×2
KIT ENCORE 26 ADVANTAGE (KITS) ×1 IMPLANT
KIT HEART LEFT (KITS) ×2 IMPLANT
PACK CARDIAC CATHETERIZATION (CUSTOM PROCEDURE TRAY) ×2 IMPLANT
STENT SYNERGY DES 2.25X20 (Permanent Stent) ×1 IMPLANT
SYR MEDRAD MARK V 150ML (SYRINGE) ×2 IMPLANT
TRANSDUCER W/STOPCOCK (MISCELLANEOUS) ×2 IMPLANT
TUBING CIL FLEX 10 FLL-RA (TUBING) ×2 IMPLANT
WIRE COUGAR XT STRL 190CM (WIRE) ×1 IMPLANT
WIRE HI TORQ BMW 190CM (WIRE) ×1 IMPLANT
WIRE HI TORQ WHISPER MS 190CM (WIRE) ×1 IMPLANT

## 2016-09-09 NOTE — Progress Notes (Signed)
  Echocardiogram 2D Echocardiogram has been performed.  Leta JunglingCooper, Amoura Ransier M 09/09/2016, 2:20 PM

## 2016-09-09 NOTE — H&P (View-Only) (Signed)
Progress Note  Patient Name: Cassie HessCynthia S Fairbairn Date of Encounter: 09/09/2016  Primary Cardiologist: Allyson SabalBerry  Subjective   No chest pain this am at rest. NO dyspnea.   Inpatient Medications    Scheduled Meds: . aspirin  324 mg Oral NOW   Or  . aspirin  300 mg Rectal NOW  . [START ON 09/10/2016] aspirin  81 mg Oral Pre-Cath  . aspirin EC  81 mg Oral Daily  . atorvastatin  80 mg Oral q1800  . enoxaparin (LOVENOX) injection  40 mg Subcutaneous QHS  . insulin aspart  8 Units Subcutaneous TID WC  . lisinopril  5 mg Oral Daily  . metoprolol succinate  50 mg Oral q morning - 10a  . nitroGLYCERIN  0.2 mg Transdermal Daily  . venlafaxine XR  75 mg Oral Q breakfast   Continuous Infusions: . [START ON 09/10/2016] sodium chloride     Followed by  . [START ON 09/10/2016] sodium chloride     PRN Meds: acetaminophen, nitroGLYCERIN, ondansetron (ZOFRAN) IV, sodium chloride flush   Vital Signs    Vitals:   09/09/16 0300 09/09/16 0400 09/09/16 0503 09/09/16 0747  BP: (!) 156/79 (!) 142/84  138/76  Pulse: 70 66  66  Resp: 14 15  15   Temp:   98.3 F (36.8 C) 98.2 F (36.8 C)  TempSrc:   Oral Oral  SpO2: 97% 95%  95%  Weight:      Height:       No intake or output data in the 24 hours ending 09/09/16 0917 Filed Weights   09/08/16 1712 09/08/16 2356  Weight: 177 lb (80.3 kg) 180 lb 14.4 oz (82.1 kg)    Telemetry    Sinus - Personally Reviewed  ECG    Sinus, T wave flattening - Personally Reviewed  Physical Exam  GEN: No acute distress.   Neck: No JVD Cardiac: RRR, no murmurs, rubs, or gallops.  Respiratory: Clear to auscultation bilaterally. GI: Soft, nontender, non-distended  EXT: No edema; No deformity. Neuro:  Nonfocal  Psych: Normal affect   Labs    Chemistry Recent Labs Lab 09/06/16 1124 09/08/16 1717 09/08/16 1827 09/09/16 0002 09/09/16 0508  NA 140 137  --   --  140  K 4.8 3.6  --   --  3.8  CL 107 103  --   --  104  CO2 25 23  --   --  26    GLUCOSE 207* 252*  --   --  225*  BUN 14 14  --   --  13  CREATININE 0.84 0.79  --  0.75 0.79  CALCIUM 9.7 9.3  --   --  9.1  PROT  --   --  7.1  --   --   ALBUMIN  --   --  3.8  --   --   AST  --   --  17  --   --   ALT  --   --  19  --   --   ALKPHOS  --   --  80  --   --   BILITOT  --   --  0.6  --   --   GFRNONAA 77 >60  --  >60 >60  GFRAA 89 >60  --  >60 >60  ANIONGAP  --  11  --   --  10     Hematology Recent Labs Lab 09/08/16 1717 09/09/16 0002 09/09/16 0508  WBC 7.6  6.3 5.6  RBC 4.43 4.17 4.32  HGB 14.0 13.1 13.7  HCT 40.9 38.5 40.3  MCV 92.3 92.3 93.3  MCH 31.6 31.4 31.7  MCHC 34.2 34.0 34.0  RDW 13.2 13.3 13.4  PLT 213 201 197    Cardiac Enzymes Recent Labs Lab 09/09/16 0002 09/09/16 0508  TROPONINI <0.03 <0.03    Recent Labs Lab 09/08/16 1727  TROPIPOC 0.00     Radiology    Dg Chest 2 View  Result Date: 09/08/2016 CLINICAL DATA:  Left-sided posterior chest pain and shortness of breath 4 hours. EXAM: CHEST  2 VIEW COMPARISON:  09/06/2016 FINDINGS: Lungs are adequately inflated and otherwise clear. Cardiomediastinal silhouette and remainder of the exam is unchanged. IMPRESSION: No active cardiopulmonary disease. Electronically Signed   By: Elberta Fortis M.D.   On: 09/08/2016 18:07   Ct Angio Chest Aorta W And/or Wo Contrast  Result Date: 09/08/2016 CLINICAL DATA:  Chest pain/ pressure radiating to back and right arm today with nausea. EXAM: CT ANGIOGRAPHY CHEST WITH CONTRAST TECHNIQUE: Multidetector CT imaging of the chest was performed using the standard protocol during bolus administration of intravenous contrast. Multiplanar CT image reconstructions and MIPs were obtained to evaluate the vascular anatomy. CONTRAST:  100 mL Isovue 370 IV. COMPARISON:  CT abdomen 05/27/2016 and chest x-ray today. FINDINGS: Cardiovascular: Borderline cardiomegaly. Minimal calcified plaque over the left main and left anterior descending coronary arteries. No evidence of  pulmonary embolism. Thoracic aorta is unremarkable. Mediastinum/Nodes: No significant mediastinal or hilar adenopathy. Remaining mediastinal structures are within normal. Lungs/Pleura: Lungs are adequately inflated with subtle dependent bibasilar atelectasis. No focal consolidation or effusion. Airways are within normal. Upper Abdomen: Previous cholecystectomy. Musculoskeletal: Mild degenerate change of the spine. Review of the MIP images confirms the above findings. IMPRESSION: No evidence of pulmonary embolism. No acute cardiopulmonary disease. Mild cardiomegaly. Electronically Signed   By: Elberta Fortis M.D.   On: 09/08/2016 21:19    Cardiac Studies     Patient Profile     61 y.o. female with history of CAD, DM, HTN, HLD admitted with chest pain worrisome for unstable angina.   Assessment & Plan    1. CAD with unstable angina: Pt with history of stent placement in the LAD in 2012 (Xience DES) and PDA in 2012 Resolute Integrity DES at [facility]. Now having accelerating angina. Troponin negative. She is followed by Dr. Allyson Sabal and cardiac cath had been planned for next week. She is on ASA, statin, beta blocker and Ace-inh.  -Will plan cardiac cath today. I have reviewed the risks and benefits of the procedure with the patient. She agrees to proceed. She is NPO.   Signed, Verne Carrow, MD  09/09/2016, 9:17 AM

## 2016-09-09 NOTE — Interval H&P Note (Signed)
History and Physical Interval Note:  09/09/2016 4:05 PM  Cassie Cordova  has presented today for surgery, with the diagnosis of unstable angina  The various methods of treatment have been discussed with the patient and family. After consideration of risks, benefits and other options for treatment, the patient has consented to  Procedure(s): Left Heart Cath and Coronary Angiography (N/A) as a surgical intervention .  The patient's history has been reviewed, patient examined, no change in status, stable for surgery.  I have reviewed the patient's chart and labs.  Questions were answered to the patient's satisfaction.    Cath Lab Visit (complete for each Cath Lab visit)  Clinical Evaluation Leading to the Procedure:   ACS: No.  Non-ACS:    Anginal Classification: CCS III  Anti-ischemic medical therapy: Maximal Therapy (2 or more classes of medications)  Non-Invasive Test Results: No non-invasive testing performed  Prior CABG: No previous CABG         Verne Carrowhristopher Berta Denson

## 2016-09-09 NOTE — Progress Notes (Signed)
Noted that patient takes Toujeo 40 units every HS, and Humalog 8-14 units TID per sliding scale at home. Does take metformin, as well.   Recommend starting Novolog SENSITIVE correction scale TID & HS or every 4 hours while NPO.  When patient begins to eat, may need to add Lantus 15-20 units daily along with correction scale TID & HS while in the hospital.   Smith MinceKendra Hicks Feick RN BSN CDE Diabetes Coordinator Pager: [phone]

## 2016-09-09 NOTE — Progress Notes (Signed)
 Progress Note  Patient Name: Cassie Cordova Date of Encounter: 09/09/2016  Primary Cardiologist: Berry  Subjective   No chest pain this am at rest. NO dyspnea.   Inpatient Medications    Scheduled Meds: . aspirin  324 mg Oral NOW   Or  . aspirin  300 mg Rectal NOW  . [START ON 09/10/2016] aspirin  81 mg Oral Pre-Cath  . aspirin EC  81 mg Oral Daily  . atorvastatin  80 mg Oral q1800  . enoxaparin (LOVENOX) injection  40 mg Subcutaneous QHS  . insulin aspart  8 Units Subcutaneous TID WC  . lisinopril  5 mg Oral Daily  . metoprolol succinate  50 mg Oral q morning - 10a  . nitroGLYCERIN  0.2 mg Transdermal Daily  . venlafaxine XR  75 mg Oral Q breakfast   Continuous Infusions: . [START ON 09/10/2016] sodium chloride     Followed by  . [START ON 09/10/2016] sodium chloride     PRN Meds: acetaminophen, nitroGLYCERIN, ondansetron (ZOFRAN) IV, sodium chloride flush   Vital Signs    Vitals:   09/09/16 0300 09/09/16 0400 09/09/16 0503 09/09/16 0747  BP: (!) 156/79 (!) 142/84  138/76  Pulse: 70 66  66  Resp: 14 15  15  Temp:   98.3 F (36.8 C) 98.2 F (36.8 C)  TempSrc:   Oral Oral  SpO2: 97% 95%  95%  Weight:      Height:       No intake or output data in the 24 hours ending 09/09/16 0917 Filed Weights   09/08/16 1712 09/08/16 2356  Weight: 177 lb (80.3 kg) 180 lb 14.4 oz (82.1 kg)    Telemetry    Sinus - Personally Reviewed  ECG    Sinus, T wave flattening - Personally Reviewed  Physical Exam  GEN: No acute distress.   Neck: No JVD Cardiac: RRR, no murmurs, rubs, or gallops.  Respiratory: Clear to auscultation bilaterally. GI: Soft, nontender, non-distended  EXT: No edema; No deformity. Neuro:  Nonfocal  Psych: Normal affect   Labs    Chemistry Recent Labs Lab 09/06/16 1124 09/08/16 1717 09/08/16 1827 09/09/16 0002 09/09/16 0508  NA 140 137  --   --  140  K 4.8 3.6  --   --  3.8  CL 107 103  --   --  104  CO2 25 23  --   --  26    GLUCOSE 207* 252*  --   --  225*  BUN 14 14  --   --  13  CREATININE 0.84 0.79  --  0.75 0.79  CALCIUM 9.7 9.3  --   --  9.1  PROT  --   --  7.1  --   --   ALBUMIN  --   --  3.8  --   --   AST  --   --  17  --   --   ALT  --   --  19  --   --   ALKPHOS  --   --  80  --   --   BILITOT  --   --  0.6  --   --   GFRNONAA 77 >60  --  >60 >60  GFRAA 89 >60  --  >60 >60  ANIONGAP  --  11  --   --  10     Hematology Recent Labs Lab 09/08/16 1717 09/09/16 0002 09/09/16 0508  WBC 7.6   6.3 5.6  RBC 4.43 4.17 4.32  HGB 14.0 13.1 13.7  HCT 40.9 38.5 40.3  MCV 92.3 92.3 93.3  MCH 31.6 31.4 31.7  MCHC 34.2 34.0 34.0  RDW 13.2 13.3 13.4  PLT 213 201 197    Cardiac Enzymes Recent Labs Lab 09/09/16 0002 09/09/16 0508  TROPONINI <0.03 <0.03    Recent Labs Lab 09/08/16 1727  TROPIPOC 0.00     Radiology    Dg Chest 2 View  Result Date: 09/08/2016 CLINICAL DATA:  Left-sided posterior chest pain and shortness of breath 4 hours. EXAM: CHEST  2 VIEW COMPARISON:  09/06/2016 FINDINGS: Lungs are adequately inflated and otherwise clear. Cardiomediastinal silhouette and remainder of the exam is unchanged. IMPRESSION: No active cardiopulmonary disease. Electronically Signed   By: Daniel  Boyle M.D.   On: 09/08/2016 18:07   Ct Angio Chest Aorta W And/or Wo Contrast  Result Date: 09/08/2016 CLINICAL DATA:  Chest pain/ pressure radiating to back and right arm today with nausea. EXAM: CT ANGIOGRAPHY CHEST WITH CONTRAST TECHNIQUE: Multidetector CT imaging of the chest was performed using the standard protocol during bolus administration of intravenous contrast. Multiplanar CT image reconstructions and MIPs were obtained to evaluate the vascular anatomy. CONTRAST:  100 mL Isovue 370 IV. COMPARISON:  CT abdomen 05/27/2016 and chest x-ray today. FINDINGS: Cardiovascular: Borderline cardiomegaly. Minimal calcified plaque over the left main and left anterior descending coronary arteries. No evidence of  pulmonary embolism. Thoracic aorta is unremarkable. Mediastinum/Nodes: No significant mediastinal or hilar adenopathy. Remaining mediastinal structures are within normal. Lungs/Pleura: Lungs are adequately inflated with subtle dependent bibasilar atelectasis. No focal consolidation or effusion. Airways are within normal. Upper Abdomen: Previous cholecystectomy. Musculoskeletal: Mild degenerate change of the spine. Review of the MIP images confirms the above findings. IMPRESSION: No evidence of pulmonary embolism. No acute cardiopulmonary disease. Mild cardiomegaly. Electronically Signed   By: Daniel  Boyle M.D.   On: 09/08/2016 21:19    Cardiac Studies     Patient Profile     60 y.o. female with history of CAD, DM, HTN, HLD admitted with chest pain worrisome for unstable angina.   Assessment & Plan    1. CAD with unstable angina: Pt with history of stent placement in the LAD in 2012 (Xience DES) and PDA in 2012 Resolute Integrity DES at [facility]. Now having accelerating angina. Troponin negative. She is followed by Dr. Berry and cardiac cath had been planned for next week. She is on ASA, statin, beta blocker and Ace-inh.  -Will plan cardiac cath today. I have reviewed the risks and benefits of the procedure with the patient. She agrees to proceed. She is NPO.   Signed, Christopher McAlhany, MD  09/09/2016, 9:17 AM    

## 2016-09-09 NOTE — Care Management Note (Signed)
Case Management Note  Patient Details  Name: Sherald HessCynthia S Roh MRN: [id number] Date of Birth: [birth year]  Subjective/Objective:    Pt admitted with unstable angina                Action/Plan:   PTA completely independent from home with husband.  Pt has active relationship with PCP and denied barriers to obtaining medications.  CM will continue to follow for discharge needs   Expected Discharge Date:                  Expected Discharge Plan:  Home/Self Care  In-House Referral:     Discharge planning Services  CM Consult  Post Acute Care Choice:    Choice offered to:     DME Arranged:    DME Agency:     HH Arranged:    HH Agency:     Status of Service:  In process, will continue to follow  If discussed at Long Length of Stay Meetings, dates discussed:    Additional Comments:  Cherylann ParrClaxton, Taisley Mordan S, RN 09/09/2016, 10:49 AM

## 2016-09-10 ENCOUNTER — Encounter (HOSPITAL_COMMUNITY): Payer: Self-pay | Admitting: Physician Assistant

## 2016-09-10 DIAGNOSIS — I2583 Coronary atherosclerosis due to lipid rich plaque: Secondary | ICD-10-CM

## 2016-09-10 DIAGNOSIS — E669 Obesity, unspecified: Secondary | ICD-10-CM

## 2016-09-10 DIAGNOSIS — I10 Essential (primary) hypertension: Secondary | ICD-10-CM

## 2016-09-10 DIAGNOSIS — Z9861 Coronary angioplasty status: Secondary | ICD-10-CM

## 2016-09-10 DIAGNOSIS — E782 Mixed hyperlipidemia: Secondary | ICD-10-CM

## 2016-09-10 DIAGNOSIS — I200 Unstable angina: Secondary | ICD-10-CM

## 2016-09-10 DIAGNOSIS — I2510 Atherosclerotic heart disease of native coronary artery without angina pectoris: Secondary | ICD-10-CM

## 2016-09-10 LAB — GLUCOSE, CAPILLARY
Glucose-Capillary: 202 mg/dL — ABNORMAL HIGH (ref 65–99)
Glucose-Capillary: 176 mg/dL — ABNORMAL HIGH (ref 65–99)

## 2016-09-10 LAB — BASIC METABOLIC PANEL
Anion gap: 10 (ref 5–15)
BUN: 16 mg/dL (ref 6–20)
CALCIUM: 9.2 mg/dL (ref 8.9–10.3)
CHLORIDE: 106 mmol/L (ref 101–111)
CO2: 26 mmol/L (ref 22–32)
CREATININE: 0.77 mg/dL (ref 0.44–1.00)
GFR calc Af Amer: >60 mL/min (ref >60)
GFR calc non Af Amer: >60 mL/min (ref >60)
GLUCOSE: 226 mg/dL — AB (ref 65–99)
Potassium: 4.7 mmol/L (ref 3.5–5.1)
Sodium: 142 mmol/L (ref 135–145)

## 2016-09-10 LAB — CBC
HCT: 39.9 % (ref 36.0–46.0)
Hemoglobin: 13.4 g/dL (ref 12.0–15.0)
MCH: 31.4 pg (ref 26.0–34.0)
MCHC: 33.6 g/dL (ref 30.0–36.0)
MCV: 93.4 fL (ref 78.0–100.0)
PLATELETS: 194 10*3/uL (ref 150–400)
RBC: 4.27 MIL/uL (ref 3.87–5.11)
RDW: 13.4 % (ref 11.5–15.5)
WBC: 5 10*3/uL (ref 4.0–10.5)

## 2016-09-10 MED ORDER — LISINOPRIL 5 MG PO TABS: 5.0000 mg | ORAL_TABLET | Freq: Once | ORAL | Status: DC

## 2016-09-10 MED ORDER — LISINOPRIL 10 MG PO TABS: 10.0000 mg | ORAL_TABLET | Freq: Every day | ORAL | 6 refills | Status: DC

## 2016-09-10 MED ORDER — ATORVASTATIN CALCIUM 80 MG PO TABS: 80.0000 mg | ORAL_TABLET | Freq: Every evening | ORAL | 6 refills | Status: DC

## 2016-09-10 MED ORDER — LISINOPRIL 10 MG PO TABS: 10.0000 mg | ORAL_TABLET | Freq: Every day | ORAL | Status: DC

## 2016-09-10 MED ORDER — TICAGRELOR 90 MG PO TABS: 90.0000 mg | ORAL_TABLET | Freq: Two times a day (BID) | ORAL | 3 refills | Status: DC

## 2016-09-10 NOTE — Progress Notes (Signed)
Patient Name: Cassie Cordova Date of Encounter: 09/10/2016  Active Problems:   Unstable angina (HCC)   Length of Stay: 2  SUBJECTIVE  No chest pain while walking this morning. Feels great.  CURRENT MEDS . aspirin EC  81 mg Oral Daily  . atorvastatin  80 mg Oral q1800  . insulin aspart  8 Units Subcutaneous TID WC  . lisinopril  5 mg Oral Daily  . metoprolol succinate  50 mg Oral q morning - 10a  . sodium chloride flush  3 mL Intravenous Q12H  . ticagrelor  90 mg Oral BID  . venlafaxine XR  75 mg Oral Q breakfast    OBJECTIVE  Vitals:   09/10/16 0400 09/10/16 0415 09/10/16 0804 09/10/16 0840  BP: (!) 148/70 (!) 142/67 (!) 147/77   Pulse: (!) 51 (!) 51 (!) 57 63  Resp: 13 10 13    Temp:   98.3 F (36.8 C)   TempSrc:   Oral   SpO2: 99% 96% 97%   Weight:      Height:        Intake/Output Summary (Last 24 hours) at 09/10/16 1148 Last data filed at 09/09/16 2000  Gross per 24 hour  Intake           281.25 ml  Output                0 ml  Net           281.25 ml   Filed Weights   09/08/16 1712 09/08/16 2356  Weight: 177 lb (80.3 kg) 180 lb 14.4 oz (82.1 kg)    PHYSICAL EXAM  General: Pleasant, NAD. Neuro: Alert and oriented X 3. Moves all extremities spontaneously. Psych: Normal affect. HEENT:  Normal  Neck: Supple without bruits or JVD. Lungs:  Resp regular and unlabored, CTA. Heart: RRR no s3, s4, or murmurs. Abdomen: Soft, non-tender, non-distended, BS + x 4.  Extremities: No clubbing, cyanosis or edema. DP/PT/Radials 2+ and equal bilaterally. No bleeding around right radial insertion site.  Accessory Clinical Findings  CBC  Recent Labs  09/09/16 0508 09/10/16 0425  WBC 5.6 5.0  HGB 13.7 13.4  HCT 40.3 39.9  MCV 93.3 93.4  PLT 197 194   Basic Metabolic Panel  Recent Labs  09/09/16 0508 09/10/16 0425  NA 140 142  K 3.8 4.7  CL 104 106  CO2 26 26  GLUCOSE 225* 226*  BUN 13 16  CREATININE 0.79 0.77  CALCIUM 9.1 9.2   Liver  Function Tests  Recent Labs  09/08/16 1827  AST 17  ALT 19  ALKPHOS 80  BILITOT 0.6  PROT 7.1  ALBUMIN 3.8    Recent Labs  09/08/16 1827  LIPASE 22   Cardiac Enzymes  Recent Labs  09/09/16 0002 09/09/16 0508 09/09/16 1116  TROPONINI <0.03 <0.03 <0.03    Recent Labs  09/09/16 0002  TSH 2.222    Radiology/Studies  Dg Chest 2 View  Result Date: 09/08/2016 CLINICAL DATA:  Left-sided posterior chest pain and shortness of breath 4 hours. EXAM: CHEST  2 VIEW COMPARISON:  09/06/2016 FINDINGS: Lungs are adequately inflated and otherwise clear. Cardiomediastinal silhouette and remainder of the exam is unchanged. IMPRESSION: No active cardiopulmonary disease. Electronically Signed   By: Elberta Fortis M.D.   On: 09/08/2016 18:07   Dg Chest 2 View  Result Date: 09/06/2016 CLINICAL DATA:  Pre op testing for cardiac cath procedure, chest pains, s.o.b, Rt arm pain, jaw pain, past  hx of CAD, hx of stents in place EXAM: CHEST  2 VIEW COMPARISON:  None. FINDINGS: Normal cardiac silhouette and mediastinal contours given slightly reduced lung volumes. Minimal bibasilar opacities favored to represent atelectasis. No discrete focal airspace opacities. No pleural effusion or pneumothorax. There is a minimal amount of pleuroparenchymal thickening about the peripheral aspect the right minor as well as the bilateral major fissures. No evidence of edema. No acute osseus abnormalities. Post cholecystectomy. IMPRESSION: No acute cardiopulmonary disease on this mildly hypoventilated examination. Electronically Signed   By: Simonne ComeJohn  Watts M.D.   On: 09/06/2016 14:06   Ct Angio Chest Aorta W And/or Wo Contrast  Result Date: 09/08/2016 CLINICAL DATA:  Chest pain/ pressure radiating to back and right arm today with nausea. EXAM: CT ANGIOGRAPHY CHEST WITH CONTRAST TECHNIQUE: Multidetector CT imaging of the chest was performed using the standard protocol during bolus administration of intravenous contrast.  Multiplanar CT image reconstructions and MIPs were obtained to evaluate the vascular anatomy. CONTRAST:  100 mL Isovue 370 IV. COMPARISON:  CT abdomen 05/27/2016 and chest x-ray today. FINDINGS: Cardiovascular: Borderline cardiomegaly. Minimal calcified plaque over the left main and left anterior descending coronary arteries. No evidence of pulmonary embolism. Thoracic aorta is unremarkable. Mediastinum/Nodes: No significant mediastinal or hilar adenopathy. Remaining mediastinal structures are within normal. Lungs/Pleura: Lungs are adequately inflated with subtle dependent bibasilar atelectasis. No focal consolidation or effusion. Airways are within normal. Upper Abdomen: Previous cholecystectomy. Musculoskeletal: Mild degenerate change of the spine. Review of the MIP images confirms the above findings. IMPRESSION: No evidence of pulmonary embolism. No acute cardiopulmonary disease. Mild cardiomegaly. Electronically Signed   By: Elberta Fortisaniel  Boyle M.D.   On: 09/08/2016 21:19    TELE: SB, personally reviewed  ECG: SB, non-specific ST-T wave abnormalities, unchanged from prior, personally reviewed  TTE: 09/09/2016 - Left ventricle: The cavity size was normal. There was mild focal   basal hypertrophy of the septum. Systolic function was normal.   The estimated ejection fraction was in the range of 60% to 65%.   Wall motion was normal; there were no regional wall motion   abnormalities. Doppler parameters are consistent with abnormal   left ventricular relaxation (grade 1 diastolic dysfunction).   Doppler parameters are consistent with elevated ventricular   end-diastolic filling pressure. - Aortic valve: Transvalvular velocity was within the normal range.   There was no stenosis. There was no regurgitation. - Mitral valve: There was mild regurgitation. - Left atrium: The atrium was mildly dilated. - Right ventricle: The cavity size was normal. Wall thickness was   normal. Systolic function was normal. -  Tricuspid valve: There was mild regurgitation. - Pulmonary arteries: Systolic pressure was mildly increased. PA   peak pressure: 33 mm Hg (S).   ASSESSMENT AND PLAN  61 y.o. female with history of CAD, DM, HTN, HLD admitted with chest pain worrisome for unstable angina.   1. CAD with unstable angina: h/o  LAD in 2012 (Xience DES) and PDA in 2012 [facility], cath yesterday showed double vessel CAD, patent stents distal Circumflex and proximal LAD, severe stenosis third OM branch, s/p successful PTCA/DES x 1 OM3. There is also a severe stenosis in the small distal and apical LAD. Balloon angioplasty with improvement in more proximal lesion in the distal segment but vessel too small for stenting. Normal LV function.  - Echo from yesterday shows preserved LVEF 60-65%.  - Continue DAPT with Brilinta and ASA - Continue metoprolol and increase lisinopril to 10  mg po daily as she is hypertensive  2. Essential hypertension -  increase lisinopril to 10 mg po daily as she is hypertensive  3. HLP - on atorvastatin 80 mg po daily  Discharge home today, follow up with Dr Clifton James within 1 week.  Signed, Tobias Alexander MD, [facility] 09/10/2016

## 2016-09-10 NOTE — Progress Notes (Signed)
Pt has been walking independently. Sts she walked two laps (940 ft) and admits that she was SOB toward end, which is unusual for her. Ed completed with pt and husband with good reception. Voiced understanding. She is not interested in CRPII as she did it before and likes to exercise on her own. However I will refer her to [facility]. She understands Brilinta although she does not have the copay card yet.  [phone] Ethelda ChickKristan Nusayba Cadenas CES, ACSM 10:41 AM 09/10/2016

## 2016-09-10 NOTE — Progress Notes (Addendum)
Right radial TR band removed at 0337. Site is clean, dry, and intact level 0 with gauze and a transparent dressing. BP 143/84 HR 92. Pt resting comfortably. Will continue to monitor.

## 2016-09-10 NOTE — Discharge Summary (Signed)
Discharge Summary    Patient ID: Cassie Cordova,  MRN: [id number], DOB/AGE: [birth year] 59 y.o.  Admit date: 09/08/2016 Discharge date: 09/10/2016  Primary Care Provider: Asencion Noble Primary Cardiologist: Dr. Gwenlyn Found  Discharge Diagnoses    Principal Problem:   Unstable angina [facility]) Active Problems:   Type 2 diabetes mellitus with vascular disease ([place])   Hyperlipidemia   Essential hypertension, benign   Coronary artery disease due to lipid rich plaque   Obesity   Diagnostic Studies/Procedures    Cardiac Cath 09/10/16 Conclusion    Prox RCA lesion, 40 %stenosed.  2nd Mrg lesion, 90 %stenosed.  Ost LPDA to LPDA lesion, 10 %stenosed.  Ost LAD to Mid LAD lesion, 10 %stenosed.  Dist LAD-2 lesion, 99 %stenosed.  A STENT SYNERGY DES 2.25X20 drug eluting stent was successfully placed.  3rd Mrg lesion, 80 %stenosed.  Post intervention, there is a 0% residual stenosis.  Ost 3rd Mrg lesion, 30 %stenosed.  Dist LAD-1 lesion, 99 %stenosed.  Post intervention, there is a 70% residual stenosis.  The left ventricular systolic function is normal.  LV end diastolic pressure is normal.  The left ventricular ejection fraction is 55-65% by visual estimate.  There is no mitral valve regurgitation.   1. Double vessel CAD 2. Patent stents distal Circumflex and proximal LAD 3. Severe stenosis third OM branch  4. Successful PTCA/DES x 1 OM3 5. Severe stenosis in the small distal and apical LAD. Balloon angioplasty with improvement in more proximal lesion in the distal segment but vessel too small for stenting.  6. Normal LV function.  7. Diffuse small branch disease.   Recommendations: Will continue DAPT with ASA and Brilinta for one year. Continue statin and beta blocker. Consider adding Imdur for small vessel disease.        _____________     History of Present Illness     Cassie Cordova is a 61 y.o. female with history of CAD (DES to prox LAD and staged DES  to LPDA 2012 at [facility]), obesity, HTN, DM, hyperlipidemia who was recently evaluated by Dr. Gwenlyn Found for progressive chest pain. Outpatient cath was scheduled but she began to develop accelerating angina, prompting her to come to the ED. ER performed CTA which ruled out dissection or pulmonary embolism. EKG showed NSR, t wave flattening in limb leads and lateral leads, possible some minor ST depression in lateral leads.  She was admitted by cardiology for further management of suspected unstable angina.  Hospital Course    She was placed on nitro paste and continued on home anti-anginals. Troponins remained negative. Labs were nonacute other than glucose of 225. TSH wnl. Cardiac cath 09/09/16 demonstrated:  1. Double vessel CAD 2. Patent stents distal Circumflex and proximal LAD 3. Severe stenosis third OM branch  4. Successful PTCA/DES x 1 OM3 5. Severe stenosis in the small distal and apical LAD. Balloon angioplasty with improvement in more proximal lesion in the distal segment but vessel too small for stenting.  6. Normal LV function.  7. Diffuse small branch disease.   It was recommended to continue DAPT with ASA and Brilinta for one year. 2D Echo 09/09/16: EF 60-65%, grade 1 DD, elevated LVEDP, mild MR, mild LAE, mild TR, normal RV, PASP 33. Lisinopril was increased due to hypertension. I have sent a message to our office's scheduler requesting a follow-up appointment, and our office will call the patient with this information. Statin dose was titrated. If the patient is tolerating statin at time of follow-up appointment,  would consider rechecking liver function/lipid panel in 6-8 weeks. Consideration could be given in the future to Imdur for small vessel disease. She was recently started on this as OP prior to cath but did not seem to require this post-cath.  Could consider for any recurrent sx. I printed out her prescriptions along with handwritten 30 day free Brilinta card given that she uses small  local pharmacy that may have limited weekend hours so that she will be able to for sure fill these today.She was advised to resume Metformin on Monday 3/26. Dr. Meda Coffee has seen and examined the patient today and feels she is stable for discharge. She feels the patient can return to work in 1 week thus a work note was provided. _____________  Discharge Vitals Blood pressure (!) 157/68, pulse 60, temperature 98.3 F (36.8 C), temperature source Oral, resp. rate 12, height '5\' 1"'$  (1.549 m), weight 180 lb 14.4 oz (82.1 kg), SpO2 96 %.  Filed Weights   09/08/16 1712 09/08/16 2356  Weight: 177 lb (80.3 kg) 180 lb 14.4 oz (82.1 kg)    Labs & Radiologic Studies    CBC  Recent Labs  09/09/16 0508 09/10/16 0425  WBC 5.6 5.0  HGB 13.7 13.4  HCT 40.3 39.9  MCV 93.3 93.4  PLT 197 272   Basic Metabolic Panel  Recent Labs  09/09/16 0508 09/10/16 0425  NA 140 142  K 3.8 4.7  CL 104 106  CO2 26 26  GLUCOSE 225* 226*  BUN 13 16  CREATININE 0.79 0.77  CALCIUM 9.1 9.2   Liver Function Tests  Recent Labs  09/08/16 1827  AST 17  ALT 19  ALKPHOS 80  BILITOT 0.6  PROT 7.1  ALBUMIN 3.8    Recent Labs  09/08/16 1827  LIPASE 22   Cardiac Enzymes  Recent Labs  09/09/16 0002 09/09/16 0508 09/09/16 1116  TROPONINI <0.03 <0.03 <0.03   Thyroid Function Tests  Recent Labs  09/09/16 0002  TSH 2.222   _____________  Dg Chest 2 View  Result Date: 09/08/2016 CLINICAL DATA:  Left-sided posterior chest pain and shortness of breath 4 hours. EXAM: CHEST  2 VIEW COMPARISON:  09/06/2016 FINDINGS: Lungs are adequately inflated and otherwise clear. Cardiomediastinal silhouette and remainder of the exam is unchanged. IMPRESSION: No active cardiopulmonary disease. Electronically Signed   By: Marin Olp M.D.   On: 09/08/2016 18:07   Dg Chest 2 View  Result Date: 09/06/2016 CLINICAL DATA:  Pre op testing for cardiac cath procedure, chest pains, s.o.b, Rt arm pain, jaw pain, past hx  of CAD, hx of stents in place EXAM: CHEST  2 VIEW COMPARISON:  None. FINDINGS: Normal cardiac silhouette and mediastinal contours given slightly reduced lung volumes. Minimal bibasilar opacities favored to represent atelectasis. No discrete focal airspace opacities. No pleural effusion or pneumothorax. There is a minimal amount of pleuroparenchymal thickening about the peripheral aspect the right minor as well as the bilateral major fissures. No evidence of edema. No acute osseus abnormalities. Post cholecystectomy. IMPRESSION: No acute cardiopulmonary disease on this mildly hypoventilated examination. Electronically Signed   By: Sandi Mariscal M.D.   On: 09/06/2016 14:06   Ct Angio Chest Aorta W And/or Wo Contrast  Result Date: 09/08/2016 CLINICAL DATA:  Chest pain/ pressure radiating to back and right arm today with nausea. EXAM: CT ANGIOGRAPHY CHEST WITH CONTRAST TECHNIQUE: Multidetector CT imaging of the chest was performed using the standard protocol during bolus administration of intravenous contrast. Multiplanar CT image reconstructions  and MIPs were obtained to evaluate the vascular anatomy. CONTRAST:  100 mL Isovue 370 IV. COMPARISON:  CT abdomen 05/27/2016 and chest x-ray today. FINDINGS: Cardiovascular: Borderline cardiomegaly. Minimal calcified plaque over the left main and left anterior descending coronary arteries. No evidence of pulmonary embolism. Thoracic aorta is unremarkable. Mediastinum/Nodes: No significant mediastinal or hilar adenopathy. Remaining mediastinal structures are within normal. Lungs/Pleura: Lungs are adequately inflated with subtle dependent bibasilar atelectasis. No focal consolidation or effusion. Airways are within normal. Upper Abdomen: Previous cholecystectomy. Musculoskeletal: Mild degenerate change of the spine. Review of the MIP images confirms the above findings. IMPRESSION: No evidence of pulmonary embolism. No acute cardiopulmonary disease. Mild cardiomegaly.  Electronically Signed   By: Marin Olp M.D.   On: 09/08/2016 21:19   Disposition   Pt is being discharged home today in good condition.  Follow-up Plans & Appointments    Follow-up Information    Quay Burow, MD Follow up.   Specialties:  Cardiology, Radiology Why:  Dr. Kennon Holter office will call you to arrange follow-up. Call if you have not heard back within 3 days. Contact information: [address] [place] [address] [phone]          Discharge Instructions    Amb Referral to Cardiac Rehabilitation    Complete by:  As directed    Diagnosis:   Coronary Stents PTCA     Diet - low sodium heart healthy    Complete by:  As directed    Increase activity slowly    Complete by:  As directed    No driving for 2 days. No lifting over 5 lbs for 1 week. No sexual activity for 1 week. You may return to work in 1 week. Keep procedure site clean & dry. If you notice increased pain, swelling, bleeding or pus, call/return!  You may shower, but no soaking baths/hot tubs/pools for 1 week.  You may restart your Metformin on Monday 09/12/16.      Discharge Medications   Allergies as of 09/10/2016   No Known Allergies     Medication List    STOP taking these medications   isosorbide mononitrate 30 MG 24 hr tablet Commonly known as:  IMDUR     TAKE these medications   acetaminophen 500 MG tablet Commonly known as:  TYLENOL Take 500-1,000 mg by mouth every 6 (six) hours as needed (for pain).   aspirin EC 81 MG tablet Take 81 mg by mouth daily.   atorvastatin 80 MG tablet Commonly known as:  LIPITOR Take 1 tablet (80 mg total) by mouth every evening. What changed:  medication strength  how much to take  when to take this   blood glucose meter kit and supplies Kit Dispense based on patient and insurance preference. Use up to four times daily as directed. (FOR ICD-10 E11.65)   co-enzyme Q-10 30 MG capsule Take 30 mg by mouth daily.     Cranberry-Vitamin C-Vitamin E 4200-20-3 MG-MG-UNIT Caps Take 1 capsule by mouth daily.   freestyle lancets Use as instructed   FREESTYLE LITE Devi 1 to test glucose 4 times a day   Insulin Glargine 300 UNIT/ML Sopn Commonly known as:  TOUJEO SOLOSTAR Inject 40 Units into the skin at bedtime. What changed:  how much to take   insulin lispro 100 UNIT/ML injection Commonly known as:  HUMALOG Inject 0.08-0.14 mLs (8-14 Units total) into the skin 3 (three) times daily before meals.   INSULIN SYRINGE .5CC/29G 29G X 1/2" 0.5  ML Misc Use 3 times to inject insulin   lisinopril 10 MG tablet Commonly known as:  PRINIVIL,ZESTRIL Take 1 tablet (10 mg total) by mouth daily. What changed:  See the new instructions.   metFORMIN 500 MG 24 hr tablet Commonly known as:  GLUCOPHAGE-XR Take 2 tablets (1,000 mg total) by mouth daily with breakfast. Notes to patient:  You may restart your Metformin on Monday 09/12/16.   metoprolol succinate 50 MG 24 hr tablet Commonly known as:  TOPROL-XL Take 1 tablet (50 mg total) by mouth every morning. What changed:  Another medication with the same name was removed. Continue taking this medication, and follow the directions you see here.   nitroGLYCERIN 0.4 MG SL tablet Commonly known as:  NITROSTAT Place 0.4 mg under the tongue every 5 (five) minutes as needed for chest pain.   ticagrelor 90 MG Tabs tablet Commonly known as:  BRILINTA Take 1 tablet (90 mg total) by mouth 2 (two) times daily.   venlafaxine XR 75 MG 24 hr capsule Commonly known as:  EFFEXOR-XR Take 75 mg by mouth daily with breakfast.        Allergies:  No Known Allergies   Outstanding Labs/Studies   N/A  Duration of Discharge Encounter   Greater than 30 minutes including physician time.  Signed, Nedra Hai Dunn PA-C 09/10/2016, 2:02 PM

## 2016-09-12 ENCOUNTER — Encounter (HOSPITAL_COMMUNITY): Admission: RE | Payer: Self-pay | Source: Ambulatory Visit

## 2016-09-12 ENCOUNTER — Ambulatory Visit (HOSPITAL_COMMUNITY)
Admission: RE | Admit: 2016-09-12 | Payer: BLUE CROSS/BLUE SHIELD | Source: Ambulatory Visit | Admitting: Cardiovascular Disease

## 2016-09-12 ENCOUNTER — Encounter (HOSPITAL_COMMUNITY): Payer: Self-pay | Admitting: Cardiovascular Disease

## 2016-09-12 SURGERY — LEFT HEART CATH AND CORONARY ANGIOGRAPHY
Anesthesia: LOCAL

## 2016-09-14 ENCOUNTER — Telehealth: Payer: Self-pay | Admitting: Cardiovascular Disease

## 2016-09-14 NOTE — Telephone Encounter (Signed)
Closed encounter °

## 2016-09-14 NOTE — Telephone Encounter (Signed)
New Message     Pt would like to switch from Dr berry to Dr Clifton JamesMcAlhany for continued care.

## 2016-09-15 NOTE — Telephone Encounter (Signed)
If this is ok with Dr. Allyson SabalBerry, it is ok with me. I cathed her last week. Cassie Cordova

## 2016-09-28 NOTE — Telephone Encounter (Signed)
That is fine with me.

## 2016-09-28 NOTE — Telephone Encounter (Signed)
I spoke with pt and scheduled her to see Dr. Clifton James on 09/30/16 at 2:00

## 2016-09-30 ENCOUNTER — Ambulatory Visit (INDEPENDENT_AMBULATORY_CARE_PROVIDER_SITE_OTHER): Payer: BLUE CROSS/BLUE SHIELD | Admitting: Cardiovascular Disease

## 2016-09-30 ENCOUNTER — Encounter: Payer: Self-pay | Admitting: Cardiovascular Disease

## 2016-09-30 ENCOUNTER — Ambulatory Visit: Payer: BLUE CROSS/BLUE SHIELD | Admitting: Cardiology

## 2016-09-30 VITALS — BP 144/82 | HR 87

## 2016-09-30 DIAGNOSIS — I10 Essential (primary) hypertension: Secondary | ICD-10-CM

## 2016-09-30 DIAGNOSIS — E7800 Pure hypercholesterolemia, unspecified: Secondary | ICD-10-CM

## 2016-09-30 DIAGNOSIS — I2510 Atherosclerotic heart disease of native coronary artery without angina pectoris: Secondary | ICD-10-CM

## 2016-09-30 MED ORDER — ATORVASTATIN CALCIUM 80 MG PO TABS: 80.0000 mg | ORAL_TABLET | Freq: Every evening | ORAL | 3 refills | Status: DC

## 2016-09-30 MED ORDER — LISINOPRIL 10 MG PO TABS: 10.0000 mg | ORAL_TABLET | Freq: Every day | ORAL | 3 refills | Status: DC

## 2016-09-30 NOTE — Patient Instructions (Signed)

## 2016-09-30 NOTE — Progress Notes (Signed)
Chief Complaint  Patient presents with  . Follow-up    History of Present Illness: 61 yo female with history of CAD, DM, HTN, HLD who is here today for cardiac follow up. She had an MI in 2012 at [facility] and had a drug eluting stent placed in the proximal LAD (Xience 2.5 x 92m) and staged PCI of the left PDA (Resolute 2.5 x 12 mm). She was seen by Dr. BGwenlyn Found3/20/18 with c/o dyspnea and chest pain. [facility] underwent cardiac cath 09/09/16 which showed patent stents in the proximal LAD and Circumflex. There was a severe stenosis in the third OM branch that was treated with a Synergy drug eluting stent (Synergy 2.5 x 20 mm). The distal LAD had a severe stenosis treated with balloon angioplasty only. Vessel too small for stenting. Normal LV function by cath. Echo 09/09/16 with normal LV systolic function, mild MR.   She is here today for follow up. The patient denies any chest pain, dyspnea, palpitations, lower extremity edema, orthopnea, PND, dizziness, near syncope or syncope.    Primary Care Physician: FAsencion Noble MD  Past Medical History:  Diagnosis Date  . CAD (coronary artery disease)    a. DES to prox LAD and [facility] [place] [place] s/p DES to OM3, PTCA to small distal and apical LAD too small for stenting, EF normal, diffuse small branch disease.  . Diabetes mellitus, type II [place]   . Hyperlipidemia   . Hypertension   . Obesity     Past Surgical History:  Procedure Laterality Date  . ABDOMINAL HYSTERECTOMY    . CESAREAN SECTION    . CHOLECYSTECTOMY    . CORONARY STENT INTERVENTION N/A 09/09/2016   Procedure: Coronary Stent Intervention;  Surgeon: CBurnell Blanks MD;  [facility];  Service: Cardiovascular;  Laterality: N/A;  OM3 DES 2.25x 20, POBA Distal LAD  . LEFT HEART CATH AND CORONARY ANGIOGRAPHY N/A 09/09/2016   Procedure: Left Heart Cath and Coronary Angiography;  Surgeon: CBurnell Blanks MD;   [facility];  Service: Cardiovascular;  Laterality: N/A;    Current Outpatient Prescriptions  Medication Sig Dispense Refill  . acetaminophen (TYLENOL) 500 MG tablet Take 500-1,000 mg by mouth every 6 (six) hours as needed (for pain).    .Marland Kitchenaspirin EC 81 MG tablet Take 81 mg by mouth daily.    . blood glucose meter kit and supplies KIT Dispense based on patient and insurance preference. Use up to four times daily as directed. (FOR ICD-10 E11.65) 1 each 0  . Blood Glucose Monitoring Suppl (FREESTYLE LITE) DEVI 1 to test glucose 4 times a day 1 each prn  . co-enzyme Q-10 30 MG capsule Take 30 mg by mouth daily.    . Coenzyme Q10 (CO Q-10 PO) Take 400 mg by mouth daily.    . Cranberry-Vitamin C-Vitamin E 4200-20-3 MG-MG-UNIT CAPS Take 1 capsule by mouth daily.    . Insulin Glargine (TOUJEO SOLOSTAR) 300 UNIT/ML SOPN Inject 40 Units into the skin at bedtime. 3 pen 1  . insulin lispro (HUMALOG) 100 UNIT/ML injection Inject 0.08-0.14 mLs (8-14 Units total) into the skin 3 (three) times daily before meals. 10 mL 2  . INSULIN SYRINGE .5CC/29G 29G X 1/2" 0.5 ML MISC Use 3 times to inject insulin 100 each 2  . Lancets (FREESTYLE) lancets Use as instructed 150 each 3  . metFORMIN (GLUCOPHAGE-XR) 500 MG 24  hr tablet Take 2 tablets (1,000 mg total) by mouth daily with breakfast. 180 tablet 0  . metoprolol succinate (TOPROL-XL) 50 MG 24 hr tablet Take 1 tablet (50 mg total) by mouth every morning. 30 tablet 3  . nitroGLYCERIN (NITROSTAT) 0.4 MG SL tablet Place 0.4 mg under the tongue every 5 (five) minutes as needed for chest pain.    . ticagrelor (BRILINTA) 90 MG TABS tablet Take 1 tablet (90 mg total) by mouth 2 (two) times daily. 180 tablet 3  . venlafaxine XR (EFFEXOR-XR) 75 MG 24 hr capsule Take 75 mg by mouth daily with breakfast.    . atorvastatin (LIPITOR) 80 MG tablet Take 1 tablet (80 mg total) by mouth every evening. 90 tablet 3  . lisinopril (PRINIVIL,ZESTRIL) 10 MG tablet Take 1  tablet (10 mg total) by mouth daily. 90 tablet 3   No current facility-administered medications for this visit.     No Known Allergies  Social History   Social History  . Marital status: Married    Spouse name: N/A  . Number of children: N/A  . Years of education: N/A   Occupational History  . Not on file.   Social History Main Topics  . Smoking status: Never Smoker  . Smokeless tobacco: Never Used  . Alcohol use No  . Drug use: No  . Sexual activity: Not on file   Other Topics Concern  . Not on file   Social History Narrative  . No narrative on file    Family History  Problem Relation Age of Onset  . Heart attack Other   . Diabetes Other     Review of Systems:  As stated in the HPI and otherwise negative.   BP (!) 144/82   Pulse 87   SpO2 98%   Physical Examination: General: Well developed, well nourished, NAD  HEENT: OP clear, mucus membranes moist  SKIN: warm, dry. No rashes. Neuro: No focal deficits  Musculoskeletal: Muscle strength 5/5 all ext  Psychiatric: Mood and affect normal  Neck: No JVD, no carotid bruits, no thyromegaly, no lymphadenopathy.  Lungs:Clear bilaterally, no wheezes, rhonci, crackles Cardiovascular: Regular rate and rhythm. No murmurs, gallops or rubs. Abdomen:Soft. Bowel sounds present. Non-tender.  Extremities: No lower extremity edema. Pulses are 2 + in the bilateral DP/PT.  Cardiac cath 09/08/16: Conclusion     Prox RCA lesion, 40 %stenosed.  2nd Mrg lesion, 90 %stenosed.  Ost LPDA to LPDA lesion, 10 %stenosed.  Ost LAD to Mid LAD lesion, 10 %stenosed.  Dist LAD-2 lesion, 99 %stenosed.  A STENT SYNERGY DES 2.25X20 drug eluting stent was successfully placed.  3rd Mrg lesion, 80 %stenosed.  Post intervention, there is a 0% residual stenosis.  Ost 3rd Mrg lesion, 30 %stenosed.  Dist LAD-1 lesion, 99 %stenosed.  Post intervention, there is a 70% residual stenosis.  The left ventricular systolic function is  normal.  LV end diastolic pressure is normal.  The left ventricular ejection fraction is 55-65% by visual estimate.  There is no mitral valve regurgitation.   1. Double vessel CAD 2. Patent stents distal Circumflex and proximal LAD 3. Severe stenosis third OM branch  4. Successful PTCA/DES x 1 OM3 5. Severe stenosis in the small distal and apical LAD. Balloon angioplasty with improvement in more proximal lesion in the distal segment but vessel too small for stenting.  6. Normal LV function.  7. Diffuse small branch disease.     Echo 09/09/16: Left ventricle: The cavity size was normal.  There was mild focal   basal hypertrophy of the septum. Systolic function was normal.   The estimated ejection fraction was in the range of 60% to 65%.   Wall motion was normal; there were no regional wall motion   abnormalities. Doppler parameters are consistent with abnormal   left ventricular relaxation (grade 1 diastolic dysfunction).   Doppler parameters are consistent with elevated ventricular   end-diastolic filling pressure. - Aortic valve: Transvalvular velocity was within the normal range.   There was no stenosis. There was no regurgitation. - Mitral valve: There was mild regurgitation. - Left atrium: The atrium was mildly dilated. - Right ventricle: The cavity size was normal. Wall thickness was   normal. Systolic function was normal. - Tricuspid valve: There was mild regurgitation. - Pulmonary arteries: Systolic pressure was mildly increased. PA   peak pressure: 33 mm Hg (S).  EKG:  EKG is not ordered today. The ekg ordered today demonstrates   Recent Labs: 09/08/2016: ALT 19 09/09/2016: TSH 2.222 09/10/2016: BUN 16; Creatinine, Ser 0.77; Hemoglobin 13.4; Platelets 194; Potassium 4.7; Sodium 142   Lipid Panel No results found for: CHOL, TRIG, HDL, CHOLHDL, VLDL, LDLCALC, LDLDIRECT   Wt Readings from Last 3 Encounters:  09/08/16 180 lb 14.4 oz (82.1 kg)  09/06/16 177 lb 6.4 oz  (80.5 kg)  07/20/16 176 lb (79.8 kg)     Other studies Reviewed: Additional studies/ records that were reviewed today include: . Review of the above records demonstrates:   Assessment and Plan:   1. CAD without angina: No recent chest pain since she had the PCI. She is on good medical therapy. Will continue ASA, Brilinta, statin and beta blocker.   2. HTN: BP slightly elevated today. Will increase Lisinopril to 10 mg daily as planned at time of discharge.   3. Hyperlipidemia: Continue statin. Will increase Lipitor to 80 mg daily as planned at time of discharge. She will need lipids and CMET in 3-4 weeks. She is asking to have this done at the appt with her endocrinologist in several weeks.   Current medicines are reviewed at length with the patient today.  The patient does not have concerns regarding medicines.  The following changes have been made:  no change  Labs/ tests ordered today include:  No orders of the defined types were placed in this encounter.   Disposition:   FU with me in 6 months  Signed, Lauree Chandler, MD 09/30/2016 2:24 PM    [address], [place], [place]  [address] Phone: [phone]; Fax: [phone]

## 2016-10-05 ENCOUNTER — Other Ambulatory Visit: Payer: Self-pay | Admitting: "Endocrinology

## 2016-10-06 ENCOUNTER — Encounter: Payer: Self-pay | Admitting: Cardiovascular Disease

## 2016-10-10 ENCOUNTER — Other Ambulatory Visit: Payer: Self-pay | Admitting: *Deleted

## 2016-10-10 MED ORDER — TICAGRELOR 90 MG PO TABS
90.0000 mg | ORAL_TABLET | Freq: Two times a day (BID) | ORAL | 3 refills | Status: DC
Start: 2016-10-10 — End: 2017-11-27

## 2016-10-12 ENCOUNTER — Encounter: Payer: Self-pay | Admitting: Cardiovascular Disease

## 2016-10-13 ENCOUNTER — Other Ambulatory Visit: Payer: Self-pay

## 2016-10-13 ENCOUNTER — Other Ambulatory Visit: Payer: Self-pay | Admitting: "Endocrinology

## 2016-10-13 LAB — MICROALBUMIN / CREATININE URINE RATIO
CREATININE, URINE: 85 mg/dL (ref 20–320)
MICROALB UR: 0.6 mg/dL
MICROALB/CREAT RATIO: 7 ug/mg{creat} (ref <30)

## 2016-10-13 LAB — LIPID PANEL
Cholesterol: 159 mg/dL (ref <200)
HDL: 50 mg/dL — ABNORMAL LOW (ref >50)
LDL CALC: 71 mg/dL (ref <100)
TRIGLYCERIDES: 190 mg/dL — AB (ref <150)
Total CHOL/HDL Ratio: 3.2 Ratio (ref <5.0)
VLDL: 38 mg/dL — AB (ref <30)

## 2016-10-13 LAB — COMPREHENSIVE METABOLIC PANEL
ALT: 15 U/L (ref 6–29)
AST: 11 U/L (ref 10–35)
Albumin: 4.2 g/dL (ref 3.6–5.1)
Alkaline Phosphatase: 77 U/L (ref 33–130)
BILIRUBIN TOTAL: 0.5 mg/dL (ref 0.2–1.2)
BUN: 20 mg/dL (ref 7–25)
CHLORIDE: 103 mmol/L (ref 98–110)
CO2: 24 mmol/L (ref 20–31)
CREATININE: 0.83 mg/dL (ref 0.50–1.05)
Calcium: 9.2 mg/dL (ref 8.6–10.4)
GLUCOSE: 359 mg/dL — AB (ref 65–99)
Potassium: 4.8 mmol/L (ref 3.5–5.3)
SODIUM: 137 mmol/L (ref 135–146)
Total Protein: 6.3 g/dL (ref 6.1–8.1)

## 2016-10-13 LAB — T4, FREE: Free T4: 1.2 ng/dL (ref 0.8–1.8)

## 2016-10-13 LAB — TSH: TSH: 1.62 m[IU]/L

## 2016-10-13 MED ORDER — INSULIN ASPART 100 UNIT/ML FLEXPEN: 8.0000 [IU] | PEN_INJECTOR | Freq: Three times a day (TID) | SUBCUTANEOUS | 2 refills | Status: DC

## 2016-10-13 MED ORDER — INSULIN LISPRO 100 UNIT/ML (KWIKPEN): 8.0000 [IU] | PEN_INJECTOR | Freq: Three times a day (TID) | SUBCUTANEOUS | 11 refills | Status: DC

## 2016-10-13 MED ORDER — INSULIN LISPRO 100 UNIT/ML ~~LOC~~ SOLN: 8.0000 [IU] | Freq: Three times a day (TID) | SUBCUTANEOUS | 2 refills | Status: DC

## 2016-10-14 LAB — HEMOGLOBIN A1C
HEMOGLOBIN A1C: 9 % — AB (ref <5.7)
MEAN PLASMA GLUCOSE: 212 mg/dL

## 2016-10-20 ENCOUNTER — Ambulatory Visit (INDEPENDENT_AMBULATORY_CARE_PROVIDER_SITE_OTHER): Payer: BLUE CROSS/BLUE SHIELD | Admitting: "Endocrinology

## 2016-10-20 ENCOUNTER — Encounter: Payer: Self-pay | Admitting: "Endocrinology

## 2016-10-20 VITALS — BP 152/90 | HR 65 | Ht 61.0 in | Wt 175.0 lb

## 2016-10-20 DIAGNOSIS — Z9119 Patient's noncompliance with other medical treatment and regimen: Secondary | ICD-10-CM

## 2016-10-20 DIAGNOSIS — I10 Essential (primary) hypertension: Secondary | ICD-10-CM

## 2016-10-20 DIAGNOSIS — E782 Mixed hyperlipidemia: Secondary | ICD-10-CM

## 2016-10-20 DIAGNOSIS — E1159 Type 2 diabetes mellitus with other circulatory complications: Secondary | ICD-10-CM

## 2016-10-20 DIAGNOSIS — Z91199 Patient's noncompliance with other medical treatment and regimen due to unspecified reason: Secondary | ICD-10-CM

## 2016-10-20 MED ORDER — INSULIN GLARGINE 300 UNIT/ML ~~LOC~~ SOPN: 40.0000 [IU] | PEN_INJECTOR | Freq: Every day | SUBCUTANEOUS | 2 refills | Status: DC

## 2016-10-20 NOTE — Patient Instructions (Signed)

## 2016-10-20 NOTE — Progress Notes (Signed)
Subjective:    Patient ID: Cassie Cordova, female    DOB: 04/07/1956,    Past Medical History:  Diagnosis Date  . CAD (coronary artery disease)    a. DES to prox LAD and staged DES to [place] 2012 at [facility]. b. [place] 08/2016 s/p DES to OM3, PTCA to small distal and apical LAD too small for stenting, EF normal, diffuse small branch disease.  . Diabetes mellitus, type II ([place])   . Hyperlipidemia   . Hypertension   . Obesity    Past Surgical History:  Procedure Laterality Date  . ABDOMINAL HYSTERECTOMY    . CESAREAN SECTION    . CHOLECYSTECTOMY    . CORONARY STENT INTERVENTION N/A 09/09/2016   Procedure: Coronary Stent Intervention;  Surgeon: Burnell Blanks, MD;  Location: [place] [facility];  Service: Cardiovascular;  Laterality: N/A;  OM3 DES 2.25x 20, POBA Distal LAD  . LEFT HEART CATH AND CORONARY ANGIOGRAPHY N/A 09/09/2016   Procedure: Left Heart Cath and Coronary Angiography;  Surgeon: Burnell Blanks, MD;  Location: [place] [facility];  Service: Cardiovascular;  Laterality: N/A;   Social History   Social History  . Marital status: Married    Spouse name: N/A  . Number of children: N/A  . Years of education: N/A   Social History Main Topics  . Smoking status: Never Smoker  . Smokeless tobacco: Never Used  . Alcohol use No  . Drug use: No  . Sexual activity: Not Asked   Other Topics Concern  . None   Social History Narrative  . None   Outpatient Encounter Prescriptions as of 10/20/2016  Medication Sig  . insulin aspart (NOVOLOG FLEXPEN) 100 UNIT/ML FlexPen Inject 8-14 Units into the skin 3 (three) times daily with meals.  Marland Kitchen acetaminophen (TYLENOL) 500 MG tablet Take 500-1,000 mg by mouth every 6 (six) hours as needed (for pain).  Marland Kitchen aspirin EC 81 MG tablet Take 81 mg by mouth daily.  Marland Kitchen atorvastatin (LIPITOR) 80 MG tablet Take 1 tablet (80 mg total) by mouth every evening.  . blood glucose meter kit and supplies KIT Dispense based on patient and insurance  preference. Use up to four times daily as directed. (FOR ICD-10 E11.65)  . Blood Glucose Monitoring Suppl (FREESTYLE LITE) DEVI 1 to test glucose 4 times a day  . co-enzyme Q-10 30 MG capsule Take 30 mg by mouth daily.  . Coenzyme Q10 (CO Q-10 PO) Take 400 mg by mouth daily.  . Cranberry-Vitamin C-Vitamin E 4200-20-3 MG-MG-UNIT CAPS Take 1 capsule by mouth daily.  . Insulin Glargine (TOUJEO SOLOSTAR) 300 UNIT/ML SOPN Inject 40 Units into the skin at bedtime.  . INSULIN SYRINGE .5CC/29G 29G X 1/2" 0.5 ML MISC Use 3 times to inject insulin  . Lancets (FREESTYLE) lancets Use as instructed  . lisinopril (PRINIVIL,ZESTRIL) 10 MG tablet Take 1 tablet (10 mg total) by mouth daily.  . metFORMIN (GLUCOPHAGE-XR) 500 MG 24 hr tablet Take 2 tablets (1,000 mg total) by mouth daily with breakfast.  . metoprolol succinate (TOPROL-XL) 50 MG 24 hr tablet Take 1 tablet (50 mg total) by mouth every morning.  . nitroGLYCERIN (NITROSTAT) 0.4 MG SL tablet Place 0.4 mg under the tongue every 5 (five) minutes as needed for chest pain.  . ticagrelor (BRILINTA) 90 MG TABS tablet Take 1 tablet (90 mg total) by mouth 2 (two) times daily.  Marland Kitchen venlafaxine XR (EFFEXOR-XR) 75 MG 24 hr capsule Take 75 mg by mouth daily with  breakfast.  . [DISCONTINUED] insulin aspart (NOVOLOG FLEXPEN) 100 UNIT/ML FlexPen Inject 8-14 Units into the skin 3 (three) times daily with meals.  . [DISCONTINUED] insulin lispro (HUMALOG KWIKPEN) 100 UNIT/ML KiwkPen Inject 0.08-0.14 mLs (8-14 Units total) into the skin 3 (three) times daily.  . [DISCONTINUED] insulin lispro (HUMALOG) 100 UNIT/ML injection Inject 0.08-0.14 mLs (8-14 Units total) into the skin 3 (three) times daily before meals.  . [DISCONTINUED] TOUJEO SOLOSTAR 300 UNIT/ML SOPN INJECT 30 UNITS S.Q. ONCE DAILY AT BEDTIME.   No facility-administered encounter medications on file as of 10/20/2016.    ALLERGIES: No Known Allergies VACCINATION STATUS:  There is no immunization history on file  for this patient.  Diabetes  She presents for her follow-up diabetic visit. She has type 2 diabetes mellitus. Onset time: She was diagnosed at approximate age of 8 years. Her disease course has been worsening. There are no hypoglycemic associated symptoms. Pertinent negatives for hypoglycemia include no confusion, headaches, pallor or seizures. Associated symptoms include fatigue. Pertinent negatives for diabetes include no chest pain, no polydipsia, no polyphagia and no polyuria. There are no hypoglycemic complications. Symptoms are worsening. Diabetic complications include heart disease. Risk factors for coronary artery disease include diabetes mellitus, dyslipidemia, hypertension and sedentary lifestyle. Current diabetic treatment includes insulin injections and oral agent (monotherapy). She is compliant with treatment some of the time (She admits inconsistency in taking her invokana and Trulicity. She also missed some doses of Toujeo as well.). Her weight is stable. She is following a generally unhealthy diet. When asked about meal planning, she reported none. She has had a previous visit with a dietitian. She rarely participates in exercise. Her home blood glucose trend is decreasing steadily. Her overall blood glucose range is >200 mg/dl. (She came with a meter showing only 27 readings in the last 30 days despite my advice for her to test 4 times a day. Her average blood glucose is between 237 and 275.) An ACE inhibitor/angiotensin II receptor blocker is being taken. Eye exam is current.  Hypertension  This is a chronic problem. The problem is uncontrolled. Pertinent negatives include no chest pain, headaches, palpitations or shortness of breath. Risk factors for coronary artery disease include dyslipidemia, diabetes mellitus and sedentary lifestyle. Past treatments include ACE inhibitors. The current treatment provides no improvement.  Hyperlipidemia  This is a chronic problem. The current episode  started more than 1 year ago. The problem is controlled. Recent lipid tests were reviewed and are normal. Pertinent negatives include no chest pain, myalgias or shortness of breath. Current antihyperlipidemic treatment includes statins. Risk factors for coronary artery disease include dyslipidemia, diabetes mellitus, hypertension and obesity.    Review of Systems  Constitutional: Positive for fatigue. Negative for unexpected weight change.  HENT: Negative for trouble swallowing and voice change.   Eyes: Negative for visual disturbance.  Respiratory: Negative for cough, shortness of breath and wheezing.   Cardiovascular: Negative for chest pain, palpitations and leg swelling.  Gastrointestinal: Negative for diarrhea, nausea and vomiting.  Endocrine: Negative for cold intolerance, heat intolerance, polydipsia, polyphagia and polyuria.  Musculoskeletal: Negative for arthralgias and myalgias.  Skin: Negative for color change, pallor, rash and wound.  Neurological: Negative for seizures and headaches.  Psychiatric/Behavioral: Negative for confusion and suicidal ideas.    Objective:    BP (!) 152/90   Pulse 65   Ht _0  (1.549 m)   Wt 175 lb (79.4 kg)   BMI 33.07 kg/m   Wt Readings from Last 3 Encounters:  10/20/16 175 lb (79.4 kg)  09/08/16 180 lb 14.4 oz (82.1 kg)  09/06/16 177 lb 6.4 oz (80.5 kg)    Physical Exam  Constitutional: She is oriented to person, place, and time. She appears well-developed.  HENT:  Head: Normocephalic and atraumatic.  Eyes: EOM are normal.  Neck: Normal range of motion. Neck supple. No tracheal deviation present. No thyromegaly present.  Cardiovascular: Normal rate and regular rhythm.   Pulmonary/Chest: Effort normal and breath sounds normal.  Abdominal: Soft. Bowel sounds are normal. There is no tenderness. There is no guarding.  Musculoskeletal: Normal range of motion. She exhibits no edema.  Neurological: She is alert and oriented to person, place,  and time. She has normal reflexes. No cranial nerve deficit. Coordination normal.  Skin: Skin is warm and dry. No rash noted. No erythema. No pallor.  Psychiatric: She has a normal mood and affect. Judgment normal.    Results for orders placed or performed in visit on 10/13/16  Microalbumin / creatinine urine ratio  Result Value Ref Range   Creatinine, Urine 85 20 - 320 mg/dL   Microalb, Ur 0.6 Not estab mg/dL   Microalb Creat Ratio 7 <30 mcg/mg creat  Comprehensive metabolic panel  Result Value Ref Range   Sodium 137 135 - 146 mmol/L   Potassium 4.8 3.5 - 5.3 mmol/L   Chloride 103 98 - 110 mmol/L   CO2 24 20 - 31 mmol/L   Glucose, Bld 359 (H) 65 - 99 mg/dL   BUN 20 7 - 25 mg/dL   Creat 0.83 0.50 - 1.05 mg/dL   Total Bilirubin 0.5 0.2 - 1.2 mg/dL   Alkaline Phosphatase 77 33 - 130 U/L   AST 11 10 - 35 U/L   ALT 15 6 - 29 U/L   Total Protein 6.3 6.1 - 8.1 g/dL   Albumin 4.2 3.6 - 5.1 g/dL   Calcium 9.2 8.6 - 10.4 mg/dL  Lipid panel  Result Value Ref Range   Cholesterol 159 <200 mg/dL   Triglycerides 190 (H) <150 mg/dL   HDL 50 (L) >50 mg/dL   Total CHOL/HDL Ratio 3.2 <5.0 Ratio   VLDL 38 (H) <30 mg/dL   LDL Cholesterol 71 <100 mg/dL  TSH  Result Value Ref Range   TSH 1.62 mIU/L  T4, free  Result Value Ref Range   Free T4 1.2 0.8 - 1.8 ng/dL  Hemoglobin A1c  Result Value Ref Range   Hgb A1c MFr Bld 9.0 (H) <5.7 %   Mean Plasma Glucose 212 mg/dL   Complete Blood Count (Most recent): Lab Results  Component Value Date   WBC 5.0 09/10/2016   HGB 13.4 09/10/2016   HCT 39.9 09/10/2016   MCV 93.4 09/10/2016   PLT 194 09/10/2016   Diabetic Labs (most recent): Lab Results  Component Value Date   HGBA1C 9.0 (H) 10/13/2016   HGBA1C 8.1 (H) 06/29/2016   HGBA1C 10.5 (H) 03/28/2016    Assessment & Plan:   1. Type 2 diabetes mellitus with vascular disease (HCC)  -Her diabetes is  complicated by coronary artery disease and patient remains at a high risk for more acute  and chronic complications of diabetes which include CAD, CVA, CKD, retinopathy, and neuropathy. These are all discussed in detail with the patient.  Patient came loss of control of diabetes with A1c higher at 9% increasing from 8.1%. This is mainly due to the fact that she is not covering most of her meals with insulin.   -  However, of late she is withdrawing from the strict monitoring of blood glucose and her meter shows on average only one reading per day. Her average is between 237 and 275.  - I have re-counseled the patient on diet management and weight loss  by adopting a carbohydrate restricted / protein rich  Diet.  - Suggestion is made for patient to avoid simple carbohydrates   from their diet including Cakes , Desserts, Ice Cream,  Soda (  diet and regular) , Sweet Tea , Candies,  Chips, Cookies, Artificial Sweeteners,   and "Sugar-free" Products .  This will help patient to have stable blood glucose profile and potentially avoid unintended  Weight gain.  - Patient is advised to stick to a routine mealtimes to eat 3 meals  a day and avoid unnecessary snacks ( to snack only to correct hypoglycemia).  - I have approached patient with the following individualized plan to manage diabetes and patient agrees.  -  She will continue to require basal/bolus insulin to control diabetes.  - I will increase Toujeo  To 40 units QHS,  continue NovoLog at 8 units 3 times a day before meals  for pre-meal blood glucose above 90 mg/dL plus patient specific correction dose, associated with strict monitoring of glucose   Before breakfast and at bedtime.  -Patient is encouraged to call clinic for blood glucose levels less than 70 or above 200 mg /dl. - Her insurance is not helpful for SGLT 2 inhibitors, Invokana and Jardiance. - She is tolerating metformin better. I advised her to maximize to 1000 mg extended release once a day after supper.  - Patient specific target  for A1c; LDL, HDL, Triglycerides, and   Waist Circumference were discussed in detail.  2) BP/HTN:  Uncontrolled.  I advised her to  continue fosinopril 5 mg by mouth every morning  along with her metoprolol 25 mg by mouth every morning.   She may need increased dose of fosinopril if blood pressure remains above target. 3) Lipids/HPL:  continue statins. 4)  Weight/Diet: CDE consult in progress, exercise, and carbohydrates information provided.  5) Vitamin D deficiency She is status post therapy with vitamin D  50,000 units by mouth weekly for the next 12 weeks.  5) Chronic Care/Health Maintenance:  -Patient  on ACEI/ARB and Statin medications and encouraged to continue to follow up with Ophthalmology, Cardiology,  Podiatrist at least yearly or according to recommendations, and advised to  stay away from smoking. I have recommended yearly flu vaccine and pneumonia vaccination at least every 5 years; moderate intensity exercise for up to 150 minutes weekly; and  sleep for at least 7 hours a day.  I advised patient to maintain close follow up with their PCP for primary care needs. She declined my offer of more frequent visit.  Patient is asked to bring meter and  blood glucose logs during their next visit.   Follow up plan: Return in about 3 months (around 01/20/2017) for follow up with pre-visit labs, meter, and logs.  Glade Lloyd, MD Phone: [phone]  Fax: [phone]   10/20/2016, 4:27 PM

## 2016-10-31 ENCOUNTER — Ambulatory Visit: Payer: BLUE CROSS/BLUE SHIELD | Admitting: Cardiovascular Disease

## 2016-11-18 ENCOUNTER — Other Ambulatory Visit: Payer: Self-pay | Admitting: "Endocrinology

## 2016-11-24 ENCOUNTER — Other Ambulatory Visit: Payer: Self-pay

## 2016-11-24 MED ORDER — METFORMIN HCL ER 500 MG PO TB24: ORAL_TABLET | ORAL | 0 refills | Status: DC

## 2017-01-16 ENCOUNTER — Other Ambulatory Visit: Payer: Self-pay | Admitting: "Endocrinology

## 2017-01-17 ENCOUNTER — Other Ambulatory Visit: Payer: Self-pay | Admitting: "Endocrinology

## 2017-01-17 LAB — COMPREHENSIVE METABOLIC PANEL
ALT: 18 U/L (ref 6–29)
AST: 15 U/L (ref 10–35)
Albumin: 3.9 g/dL (ref 3.6–5.1)
Alkaline Phosphatase: 88 U/L (ref 33–130)
BUN: 12 mg/dL (ref 7–25)
CHLORIDE: 106 mmol/L (ref 98–110)
CO2: 29 mmol/L (ref 20–31)
CREATININE: 0.8 mg/dL (ref 0.50–1.05)
Calcium: 9.2 mg/dL (ref 8.6–10.4)
GLUCOSE: 230 mg/dL — AB (ref 65–99)
Potassium: 4.9 mmol/L (ref 3.5–5.3)
SODIUM: 142 mmol/L (ref 135–146)
Total Bilirubin: 0.4 mg/dL (ref 0.2–1.2)
Total Protein: 6.3 g/dL (ref 6.1–8.1)

## 2017-01-18 LAB — HEMOGLOBIN A1C
Hgb A1c MFr Bld: 9.7 % — ABNORMAL HIGH (ref <5.7)
Mean Plasma Glucose: 232 mg/dL

## 2017-01-23 ENCOUNTER — Other Ambulatory Visit: Payer: Self-pay | Admitting: *Deleted

## 2017-01-23 ENCOUNTER — Telehealth: Payer: Self-pay | Admitting: "Endocrinology

## 2017-01-23 DIAGNOSIS — E1159 Type 2 diabetes mellitus with other circulatory complications: Secondary | ICD-10-CM

## 2017-01-23 MED ORDER — INSULIN GLARGINE 300 UNIT/ML ~~LOC~~ SOPN: 40.0000 [IU] | PEN_INJECTOR | Freq: Every day | SUBCUTANEOUS | 2 refills | Status: DC

## 2017-01-23 NOTE — Telephone Encounter (Signed)
[facility] HAS QUESTIONS REGARDING Insulin Glargine (TOUJEO SOLOSTAR) 300 UNIT/ML SOPN  PLEASE ADVISE?

## 2017-01-23 NOTE — Telephone Encounter (Signed)
Called [facility] & new RX needed for the Toujeo--  Faxed

## 2017-01-24 ENCOUNTER — Ambulatory Visit (INDEPENDENT_AMBULATORY_CARE_PROVIDER_SITE_OTHER): Payer: BLUE CROSS/BLUE SHIELD | Admitting: "Endocrinology

## 2017-01-24 ENCOUNTER — Encounter: Payer: Self-pay | Admitting: "Endocrinology

## 2017-01-24 VITALS — BP 164/73 | HR 63 | Wt 175.0 lb

## 2017-01-24 DIAGNOSIS — Z9119 Patient's noncompliance with other medical treatment and regimen: Secondary | ICD-10-CM

## 2017-01-24 DIAGNOSIS — Z91199 Patient's noncompliance with other medical treatment and regimen due to unspecified reason: Secondary | ICD-10-CM

## 2017-01-24 DIAGNOSIS — E782 Mixed hyperlipidemia: Secondary | ICD-10-CM

## 2017-01-24 DIAGNOSIS — E1159 Type 2 diabetes mellitus with other circulatory complications: Secondary | ICD-10-CM | POA: Diagnosis not present

## 2017-01-24 DIAGNOSIS — I10 Essential (primary) hypertension: Secondary | ICD-10-CM | POA: Diagnosis not present

## 2017-01-24 MED ORDER — FREESTYLE LIBRE READER DEVI: 1.0000 | Freq: Once | 0 refills | Status: AC

## 2017-01-24 MED ORDER — FREESTYLE LIBRE SENSOR SYSTEM MISC: 2 refills | Status: DC

## 2017-01-24 NOTE — Progress Notes (Signed)
Subjective:    Patient ID: Cassie Cordova, female    DOB: 01/24/1956,    Past Medical History:  Diagnosis Date  . CAD (coronary artery disease)    a. DES to prox LAD and staged DES to [place] 2012 at [facility]. b. [place] 08/2016 s/p DES to OM3, PTCA to small distal and apical LAD too small for stenting, EF normal, diffuse small branch disease.  . Diabetes mellitus, type II ([place])   . Hyperlipidemia   . Hypertension   . Obesity    Past Surgical History:  Procedure Laterality Date  . ABDOMINAL HYSTERECTOMY    . CESAREAN SECTION    . CHOLECYSTECTOMY    . CORONARY STENT INTERVENTION N/A 09/09/2016   Procedure: Coronary Stent Intervention;  Surgeon: Burnell Blanks, MD;  Location: [facility];  Service: Cardiovascular;  Laterality: N/A;  OM3 DES 2.25x 20, POBA Distal LAD  . LEFT HEART CATH AND CORONARY ANGIOGRAPHY N/A 09/09/2016   Procedure: Left Heart Cath and Coronary Angiography;  Surgeon: Burnell Blanks, MD;  Location: [facility];  Service: Cardiovascular;  Laterality: N/A;   Social History   Social History  . Marital status: Married    Spouse name: N/A  . Number of children: N/A  . Years of education: N/A   Social History Main Topics  . Smoking status: Never Smoker  . Smokeless tobacco: Never Used  . Alcohol use No  . Drug use: No  . Sexual activity: Not Asked   Other Topics Concern  . None   Social History Narrative  . None   Outpatient Encounter Prescriptions as of 01/24/2017  Medication Sig  . acetaminophen (TYLENOL) 500 MG tablet Take 500-1,000 mg by mouth every 6 (six) hours as needed (for pain).  Marland Kitchen aspirin EC 81 MG tablet Take 81 mg by mouth daily.  Marland Kitchen atorvastatin (LIPITOR) 80 MG tablet Take 1 tablet (80 mg total) by mouth every evening.  . blood glucose meter kit and supplies KIT Dispense based on patient and insurance preference. Use up to four times daily as directed. (FOR ICD-10 E11.65)  . Blood Glucose Monitoring Suppl (FREESTYLE  LITE) DEVI 1 to test glucose 4 times a day  . co-enzyme Q-10 30 MG capsule Take 30 mg by mouth daily.  . Coenzyme Q10 (CO Q-10 PO) Take 400 mg by mouth daily.  . Cranberry-Vitamin C-Vitamin E 4200-20-3 MG-MG-UNIT CAPS Take 1 capsule by mouth daily.  . Insulin Glargine 300 UNIT/ML SOPN Inject 50 Units into the skin at bedtime.  . INSULIN SYRINGE .5CC/29G 29G X 1/2" 0.5 ML MISC Use 3 times to inject insulin  . Lancets (FREESTYLE) lancets Use as instructed  . lisinopril (PRINIVIL,ZESTRIL) 10 MG tablet Take 1 tablet (10 mg total) by mouth daily.  . metFORMIN (GLUCOPHAGE-XR) 500 MG 24 hr tablet TAKE 2 TABLETS BY MOUTH DAILY WITH BREAKFAST  . metoprolol succinate (TOPROL-XL) 50 MG 24 hr tablet Take 1 tablet (50 mg total) by mouth every morning.  . nitroGLYCERIN (NITROSTAT) 0.4 MG SL tablet Place 0.4 mg under the tongue every 5 (five) minutes as needed for chest pain.  Marland Kitchen NOVOLOG FLEXPEN 100 UNIT/ML FlexPen INJECT 8 TO 14 UNITS INTO THE SKIN 3 TIMES DAILY WITH MEALS  . ticagrelor (BRILINTA) 90 MG TABS tablet Take 1 tablet (90 mg total) by mouth 2 (two) times daily.  Marland Kitchen venlafaxine XR (EFFEXOR-XR) 75 MG 24 hr capsule Take 75 mg by mouth daily with breakfast.  . [DISCONTINUED] Insulin Glargine (  TOUJEO SOLOSTAR) 300 UNIT/ML SOPN Inject 40 Units into the skin at bedtime.  . [DISCONTINUED] NOVOLOG FLEXPEN 100 UNIT/ML FlexPen INJECT 8 TO 14 UNITS INTO THE SKIN 3 TIMES DAILY WITH MEALS  . Continuous Blood Gluc Receiver (FREESTYLE LIBRE READER) DEVI 1 Piece by Does not apply route once.  . Continuous Blood Gluc Sensor (FREESTYLE LIBRE SENSOR SYSTEM) MISC Use one sensor every 10 days.   No facility-administered encounter medications on file as of 01/24/2017.    ALLERGIES: No Known Allergies VACCINATION STATUS:  There is no immunization history on file for this patient.  Diabetes  She presents for her follow-up diabetic visit. She has type 2 diabetes mellitus. Onset time: She was diagnosed at approximate age  of 59 years. Her disease course has been worsening. There are no hypoglycemic associated symptoms. Pertinent negatives for hypoglycemia include no confusion, headaches, pallor or seizures. Associated symptoms include fatigue. Pertinent negatives for diabetes include no chest pain, no polydipsia, no polyphagia and no polyuria. There are no hypoglycemic complications. Symptoms are worsening. Diabetic complications include heart disease. Risk factors for coronary artery disease include diabetes mellitus, dyslipidemia, hypertension and sedentary lifestyle. Current diabetic treatment includes insulin injections and oral agent (monotherapy). She is compliant with treatment some of the time (She admits inconsistency in taking her invokana and Trulicity. She also missed some doses of Toujeo as well.). Her weight is stable. She is following a generally unhealthy diet. When asked about meal planning, she reported none. She has had a previous visit with a dietitian. She rarely participates in exercise. Her home blood glucose trend is increasing steadily. Her overall blood glucose range is >200 mg/dl. (She came with a meter showing only 25 readings in the last 30 days despite my advice for her to test 4 times a day. Her average blood glucose is between 236 and 275.) An ACE inhibitor/angiotensin II receptor blocker is being taken. Eye exam is current.  Hypertension  This is a chronic problem. The problem is uncontrolled. Pertinent negatives include no chest pain, headaches, palpitations or shortness of breath. Risk factors for coronary artery disease include dyslipidemia, diabetes mellitus and sedentary lifestyle. Past treatments include ACE inhibitors. The current treatment provides no improvement.  Hyperlipidemia  This is a chronic problem. The current episode started more than 1 year ago. The problem is controlled. Recent lipid tests were reviewed and are normal. Pertinent negatives include no chest pain, myalgias or  shortness of breath. Current antihyperlipidemic treatment includes statins. Risk factors for coronary artery disease include dyslipidemia, diabetes mellitus, hypertension and obesity.    Review of Systems  Constitutional: Positive for fatigue. Negative for unexpected weight change.  HENT: Negative for trouble swallowing and voice change.   Eyes: Negative for visual disturbance.  Respiratory: Negative for cough, shortness of breath and wheezing.   Cardiovascular: Negative for chest pain, palpitations and leg swelling.  Gastrointestinal: Negative for diarrhea, nausea and vomiting.  Endocrine: Negative for cold intolerance, heat intolerance, polydipsia, polyphagia and polyuria.  Musculoskeletal: Negative for arthralgias and myalgias.  Skin: Negative for color change, pallor, rash and wound.  Neurological: Negative for seizures and headaches.  Psychiatric/Behavioral: Negative for confusion and suicidal ideas.    Objective:    BP (!) 164/73   Pulse 63   Wt 175 lb (79.4 kg)   SpO2 97%   BMI 33.07 kg/m   Wt Readings from Last 3 Encounters:  01/24/17 175 lb (79.4 kg)  10/20/16 175 lb (79.4 kg)  09/08/16 180 lb 14.4 oz (82.1 kg)  Physical Exam  Constitutional: She is oriented to person, place, and time. She appears well-developed.  HENT:  Head: Normocephalic and atraumatic.  Eyes: EOM are normal.  Neck: Normal range of motion. Neck supple. No tracheal deviation present. No thyromegaly present.  Cardiovascular: Normal rate and regular rhythm.   Pulmonary/Chest: Effort normal and breath sounds normal.  Abdominal: Soft. Bowel sounds are normal. There is no tenderness. There is no guarding.  Musculoskeletal: Normal range of motion. She exhibits no edema.  Neurological: She is alert and oriented to person, place, and time. She has normal reflexes. No cranial nerve deficit. Coordination normal.  Skin: Skin is warm and dry. No rash noted. No erythema. No pallor.  Psychiatric: She has a  normal mood and affect. Judgment normal.    Results for orders placed or performed in visit on 01/17/17  Comprehensive metabolic panel  Result Value Ref Range   Sodium 142 135 - 146 mmol/L   Potassium 4.9 3.5 - 5.3 mmol/L   Chloride 106 98 - 110 mmol/L   CO2 29 20 - 31 mmol/L   Glucose, Bld 230 (H) 65 - 99 mg/dL   BUN 12 7 - 25 mg/dL   Creat 0.80 0.50 - 1.05 mg/dL   Total Bilirubin 0.4 0.2 - 1.2 mg/dL   Alkaline Phosphatase 88 33 - 130 U/L   AST 15 10 - 35 U/L   ALT 18 6 - 29 U/L   Total Protein 6.3 6.1 - 8.1 g/dL   Albumin 3.9 3.6 - 5.1 g/dL   Calcium 9.2 8.6 - 10.4 mg/dL  Hemoglobin A1c  Result Value Ref Range   Hgb A1c MFr Bld 9.7 (H) <5.7 %   Mean Plasma Glucose 232 mg/dL   Complete Blood Count (Most recent): Lab Results  Component Value Date   WBC 5.0 09/10/2016   HGB 13.4 09/10/2016   HCT 39.9 09/10/2016   MCV 93.4 09/10/2016   PLT 194 09/10/2016   Diabetic Labs (most recent): Lab Results  Component Value Date   HGBA1C 9.7 (H) 01/17/2017   HGBA1C 9.0 (H) 10/13/2016   HGBA1C 8.1 (H) 06/29/2016    Assessment & Plan:   1. Type 2 diabetes mellitus with vascular disease (HCC)  -Her diabetes is  complicated by coronary artery disease and patient remains at a high risk for more acute and chronic complications of diabetes which include CAD, CVA, CKD, retinopathy, and neuropathy. These are all discussed in detail with the patient.  Patient came loss of control of diabetes with A1c higher at 9.7% increasing from 8.1%. This is mainly due to the fact that she is not covering most of her meals with insulin.   - However, of late she is withdrawing from the strict monitoring of blood glucose and her meter shows on average only one reading per day. Her average is between 236 and 275, Monitoring 25 times in the last 30 days.  - I have re-counseled the patient on diet management and weight loss  by adopting a carbohydrate restricted / protein rich  Diet.  - Suggestion is  made for patient to avoid simple carbohydrates   from her diet including Cakes , Desserts, Ice Cream,  Soda (  diet and regular) , Sweet Tea , Candies,  Chips, Cookies, Artificial Sweeteners,   and "Sugar-free" Products .  This will help patient to have stable blood glucose profile and potentially avoid unintended  Weight gain.  - Patient is advised to stick to a routine mealtimes to eat 3  meals  a day and avoid unnecessary snacks ( to snack only to correct hypoglycemia).  - I have approached patient with the following individualized plan to manage diabetes and patient agrees.  -  She will continue to require basal/bolus insulin to control diabetes.  -  She is interested to get continuous glucose monitoring device from  [place] . I believe she would benefit from this device, hence I will prescribed.  - In the meantime, I will increase Toujeo to 50 units daily at bedtime,  continue NovoLog at 8 units 3 times a day before meals  for pre-meal blood glucose above 90 mg/dL plus patient specific correction dose. She is advised to resume and continue strict monitoring of blood glucose 4 times a day-before meals and at bedtime.   -Patient is encouraged to call clinic for blood glucose levels less than 70 or above 200 mg /dl. - Her insurance is not helpful for SGLT 2 inhibitors, Invokana and Jardiance. - She is tolerating metformin better. I advised her to maximize to 1000 mg extended release once a day after supper.  - Patient specific target  for A1c; LDL, HDL, Triglycerides, and  Waist Circumference were discussed in detail.  2) BP/HTN:  Uncontrolled.  I advised her to  continue lisinopril 10 mg by mouth every morning  along with her metoprolol 25 mg by mouth every morning.    3) Lipids/HPL:  continue statins. 4)  Weight/Diet: CDE consult in progress, exercise, and carbohydrates information provided.  5) Vitamin D deficiency She is status post therapy with vitamin D  50,000 units by mouth weekly for   12 weeks.  5) Chronic Care/Health Maintenance:  -Patient  on ACEI/ARB and Statin medications and encouraged to continue to follow up with Ophthalmology, Cardiology,  Podiatrist at least yearly or according to recommendations, and advised to  stay away from smoking. I have recommended yearly flu vaccine and pneumonia vaccination at least every 5 years; moderate intensity exercise for up to 150 minutes weekly; and  sleep for at least 7 hours a day.  I advised patient to maintain close follow up with their PCP for primary care needs. She declined my offer of more frequent visit.  Patient is asked to bring meter and  blood glucose logs during her next visit.   Follow up plan: Return in about 3 months (around 04/26/2017) for meter, and logs.  Glade Lloyd, MD Phone: [phone]  Fax: [phone]   01/24/2017, 3:42 PM

## 2017-01-24 NOTE — Patient Instructions (Signed)

## 2017-01-26 ENCOUNTER — Other Ambulatory Visit: Payer: Self-pay | Admitting: "Endocrinology

## 2017-01-26 ENCOUNTER — Telehealth: Payer: Self-pay | Admitting: "Endocrinology

## 2017-01-26 MED ORDER — FREESTYLE LIBRE READER DEVI
1.0000 | Freq: Once | 0 refills | Status: AC
Start: 2017-01-26 — End: 2017-01-26

## 2017-01-26 MED ORDER — FREESTYLE LIBRE SENSOR SYSTEM MISC: 2 refills | Status: DC

## 2017-01-26 NOTE — Telephone Encounter (Signed)
Aram BeechamCynthia is calling asking if Dr. Fransico HimNida would send in to the pharmacy for her a Rx for a Free Style [facility] to [facility] [place], please advise?

## 2017-01-30 ENCOUNTER — Other Ambulatory Visit: Payer: Self-pay

## 2017-01-30 MED ORDER — INSULIN GLARGINE 300 UNIT/ML ~~LOC~~ SOPN: 50.0000 [IU] | PEN_INJECTOR | Freq: Every day | SUBCUTANEOUS | 2 refills | Status: DC

## 2017-01-31 LAB — HM DIABETES EYE EXAM

## 2017-02-07 ENCOUNTER — Telehealth: Payer: Self-pay | Admitting: Nutrition

## 2017-02-07 NOTE — Telephone Encounter (Signed)
TC from pt who states her BS before lunch was 89 mg/dl. She notes she will not take meal time insulin due to BS less than 90 mg/dl.  Complains of headaches daily. Talked with Dr. Fransico Him who requested her to reduce her meal time insulin from 8 units + SS to 5 units +SS insulin coverage before meals. Pt. Verbalized understanding. FBS have been running 136 mg/dl last 2 days. Feels much better overall since getting the Libre CGM.Marland Kitchen

## 2017-02-08 NOTE — Telephone Encounter (Signed)
Noted. Thank you for your help in the care of this patient.

## 2017-02-10 ENCOUNTER — Other Ambulatory Visit: Payer: Self-pay | Admitting: "Endocrinology

## 2017-04-09 ENCOUNTER — Other Ambulatory Visit: Payer: Self-pay | Admitting: Cardiovascular Disease

## 2017-04-14 ENCOUNTER — Other Ambulatory Visit: Payer: Self-pay | Admitting: "Endocrinology

## 2017-04-22 LAB — RENAL FUNCTION PANEL
Albumin: 4.2 g/dL (ref 3.6–5.1)
BUN: 12 mg/dL (ref 7–25)
CO2: 28 mmol/L (ref 20–32)
CREATININE: 0.8 mg/dL (ref 0.50–1.05)
Calcium: 9.1 mg/dL (ref 8.6–10.4)
Chloride: 107 mmol/L (ref 98–110)
Glucose, Bld: 267 mg/dL — ABNORMAL HIGH (ref 65–99)
POTASSIUM: 4.6 mmol/L (ref 3.5–5.3)
Phosphorus: 3.4 mg/dL (ref 2.5–4.5)
SODIUM: 142 mmol/L (ref 135–146)

## 2017-04-22 LAB — HEMOGLOBIN A1C
HEMOGLOBIN A1C: 9.1 %{Hb} — AB (ref <5.7)
MEAN PLASMA GLUCOSE: 214 (calc)
eAG (mmol/L): 11.9 (calc)

## 2017-04-27 ENCOUNTER — Ambulatory Visit: Payer: BLUE CROSS/BLUE SHIELD | Admitting: "Endocrinology

## 2017-04-27 ENCOUNTER — Encounter: Payer: Self-pay | Admitting: "Endocrinology

## 2017-04-27 VITALS — BP 148/76 | HR 64 | Ht 61.0 in | Wt 176.0 lb

## 2017-04-27 DIAGNOSIS — E1159 Type 2 diabetes mellitus with other circulatory complications: Secondary | ICD-10-CM | POA: Diagnosis not present

## 2017-04-27 DIAGNOSIS — I10 Essential (primary) hypertension: Secondary | ICD-10-CM | POA: Diagnosis not present

## 2017-04-27 DIAGNOSIS — E782 Mixed hyperlipidemia: Secondary | ICD-10-CM | POA: Diagnosis not present

## 2017-04-27 MED ORDER — INSULIN ASPART 100 UNIT/ML FLEXPEN: PEN_INJECTOR | SUBCUTANEOUS | 2 refills | Status: DC

## 2017-04-27 MED ORDER — INSULIN GLARGINE 300 UNIT/ML ~~LOC~~ SOPN: 60.0000 [IU] | PEN_INJECTOR | Freq: Every day | SUBCUTANEOUS | 2 refills | Status: DC

## 2017-04-27 NOTE — Progress Notes (Signed)
Subjective:    Patient ID: Cassie Cordova, female    DOB: [birth year],    Past Medical History:  Diagnosis Date  . CAD (coronary artery disease)    a. DES to prox LAD and staged DES to [place] 2012 at [facility]. b. [place] 08/2016 s/p DES to OM3, PTCA to small distal and apical LAD too small for stenting, EF normal, diffuse small branch disease.  . Diabetes mellitus, type II ([place])   . Hyperlipidemia   . Hypertension   . Obesity    Past Surgical History:  Procedure Laterality Date  . ABDOMINAL HYSTERECTOMY    . CESAREAN SECTION    . CHOLECYSTECTOMY     Social History   Socioeconomic History  . Marital status: Married    Spouse name: None  . Number of children: None  . Years of education: None  . Highest education level: None  Social Needs  . Financial resource strain: None  . Food insecurity - worry: None  . Food insecurity - inability: None  . Transportation needs - medical: None  . Transportation needs - non-medical: None  Occupational History  . None  Tobacco Use  . Smoking status: Never Smoker  . Smokeless tobacco: Never Used  Substance and Sexual Activity  . Alcohol use: No    Alcohol/week: 0.0 oz  . Drug use: No  . Sexual activity: None  Other Topics Concern  . None  Social History Narrative  . None   Outpatient Encounter Medications as of 04/27/2017  Medication Sig  . acetaminophen (TYLENOL) 500 MG tablet Take 500-1,000 mg by mouth every 6 (six) hours as needed (for pain).  Marland Kitchen aspirin EC 81 MG tablet Take 81 mg by mouth daily.  Marland Kitchen atorvastatin (LIPITOR) 80 MG tablet Take 1 tablet (80 mg total) by mouth every evening.  . blood glucose meter kit and supplies KIT Dispense based on patient and insurance preference. Use up to four times daily as directed. (FOR ICD-10 E11.65)  . Blood Glucose Monitoring Suppl (FREESTYLE LITE) DEVI 1 to test glucose 4 times a day  . co-enzyme Q-10 30 MG capsule Take 30 mg by mouth daily.  . Coenzyme Q10 (CO Q-10 PO) Take 400 mg by mouth  daily.  . Continuous Blood Gluc Sensor (FREESTYLE LIBRE SENSOR SYSTEM) MISC Use one sensor every 10 days.  . Cranberry-Vitamin C-Vitamin E 4200-20-3 MG-MG-UNIT CAPS Take 1 capsule by mouth daily.  . insulin aspart (NOVOLOG FLEXPEN) 100 UNIT/ML FlexPen INJECT 10 TO 16 UNITS INTO THE SKIN 3 TIMES DAILY WITH MEALS  . Insulin Glargine (TOUJEO SOLOSTAR) 300 UNIT/ML SOPN Inject 60 Units at bedtime into the skin.  . INSULIN SYRINGE .5CC/29G 29G X 1/2" 0.5 ML MISC Use 3 times to inject insulin  . Lancets (FREESTYLE) lancets Use as instructed  . lisinopril (PRINIVIL,ZESTRIL) 10 MG tablet Take 1 tablet (10 mg total) by mouth daily.  . metFORMIN (GLUCOPHAGE-XR) 500 MG 24 hr tablet TAKE 2 TABLETS BY MOUTH DAILY WITH BREAKFAST  . metoprolol succinate (TOPROL-XL) 50 MG 24 hr tablet Take 1 tablet (50 mg total) by mouth every morning.  . metoprolol succinate (TOPROL-XL) 50 MG 24 hr tablet TAKE 1 TABLET(50 MG TOTAL) BY MOUTH EVERY MORNING  . nitroGLYCERIN (NITROSTAT) 0.4 MG SL tablet Place 0.4 mg under the tongue every 5 (five) minutes as needed for chest pain.  . ticagrelor (BRILINTA) 90 MG TABS tablet Take 1 tablet (90 mg total) by mouth 2 (two) times daily.  Marland Kitchen venlafaxine XR (EFFEXOR-XR)  75 MG 24 hr capsule Take 75 mg by mouth daily with breakfast.  . [DISCONTINUED] NOVOLOG FLEXPEN 100 UNIT/ML FlexPen INJECT 8 TO 14 UNITS INTO THE SKIN 3 TIMES DAILY WITH MEALS  . [DISCONTINUED] TOUJEO SOLOSTAR 300 UNIT/ML SOPN INJECT 50 UNITS UNDER THE SKIN AT BEDTIME   No facility-administered encounter medications on file as of 04/27/2017.    ALLERGIES: No Known Allergies VACCINATION STATUS:  There is no immunization history on file for this patient.  Diabetes  She presents for her follow-up diabetic visit. She has type 2 diabetes mellitus. Onset time: She was diagnosed at approximate age of 3 years. Her disease course has been improving. There are no hypoglycemic associated symptoms. Pertinent negatives for  hypoglycemia include no confusion, headaches, pallor or seizures. Associated symptoms include fatigue. Pertinent negatives for diabetes include no chest pain, no polydipsia, no polyphagia and no polyuria. There are no hypoglycemic complications. Symptoms are improving. Diabetic complications include heart disease. Risk factors for coronary artery disease include diabetes mellitus, dyslipidemia, hypertension and sedentary lifestyle. Current diabetic treatment includes insulin injections and oral agent (monotherapy). She is compliant with treatment some of the time (She admits inconsistency in taking her invokana and Trulicity. She also missed some doses of Toujeo as well.). Her weight is stable. She is following a generally unhealthy diet. When asked about meal planning, she reported none. She has had a previous visit with a dietitian. She rarely participates in exercise. Her home blood glucose trend is increasing steadily. Her breakfast blood glucose range is generally >200 mg/dl. Her lunch blood glucose range is generally >200 mg/dl. Her dinner blood glucose range is generally >200 mg/dl. Her bedtime blood glucose range is generally >200 mg/dl. Her overall blood glucose range is >200 mg/dl. (She did better after she was put on continuous glucose sensor. Her A1c is slowly improving to 9.1%. He did have noncoverage of multiple meals recently.) An ACE inhibitor/angiotensin II receptor blocker is being taken. Eye exam is current.  Hypertension  This is a chronic problem. The problem is uncontrolled. Pertinent negatives include no chest pain, headaches, palpitations or shortness of breath. Risk factors for coronary artery disease include dyslipidemia, diabetes mellitus and sedentary lifestyle. Past treatments include ACE inhibitors. The current treatment provides no improvement.  Hyperlipidemia  This is a chronic problem. The current episode started more than 1 year ago. The problem is controlled. Recent lipid tests  were reviewed and are normal. Pertinent negatives include no chest pain, myalgias or shortness of breath. Current antihyperlipidemic treatment includes statins. Risk factors for coronary artery disease include dyslipidemia, diabetes mellitus, hypertension and obesity.    Review of Systems  Constitutional: Positive for fatigue. Negative for unexpected weight change.  HENT: Negative for trouble swallowing and voice change.   Eyes: Negative for visual disturbance.  Respiratory: Negative for cough, shortness of breath and wheezing.   Cardiovascular: Negative for chest pain, palpitations and leg swelling.  Gastrointestinal: Negative for diarrhea, nausea and vomiting.  Endocrine: Negative for cold intolerance, heat intolerance, polydipsia, polyphagia and polyuria.  Musculoskeletal: Negative for arthralgias and myalgias.  Skin: Negative for color change, pallor, rash and wound.  Neurological: Negative for seizures and headaches.  Psychiatric/Behavioral: Negative for confusion and suicidal ideas.    Objective:    BP (!) 148/76   Pulse 64   Ht _0  (1.549 m)   Wt 176 lb (79.8 kg)   BMI 33.25 kg/m   Wt Readings from Last 3 Encounters:  04/27/17 176 lb (79.8 kg)  01/24/17  175 lb (79.4 kg)  10/20/16 175 lb (79.4 kg)    Physical Exam  Constitutional: She is oriented to person, place, and time. She appears well-developed.  HENT:  Head: Normocephalic and atraumatic.  Eyes: EOM are normal.  Neck: Normal range of motion. Neck supple. No tracheal deviation present. No thyromegaly present.  Cardiovascular: Normal rate and regular rhythm.  Pulmonary/Chest: Effort normal and breath sounds normal.  Abdominal: Soft. Bowel sounds are normal. There is no tenderness. There is no guarding.  Musculoskeletal: Normal range of motion. She exhibits no edema.  Neurological: She is alert and oriented to person, place, and time. She has normal reflexes. No cranial nerve deficit. Coordination normal.  Skin:  Skin is warm and dry. No rash noted. No erythema. No pallor.  Psychiatric: She has a normal mood and affect. Judgment normal.    Results for orders placed or performed in visit on 02/09/17  HM DIABETES EYE EXAM  Result Value Ref Range   HM Diabetic Eye Exam No Retinopathy No Retinopathy   Complete Blood Count (Most recent): Lab Results  Component Value Date   WBC 5.0 09/10/2016   HGB 13.4 09/10/2016   HCT 39.9 09/10/2016   MCV 93.4 09/10/2016   PLT 194 09/10/2016   Diabetic Labs (most recent): Lab Results  Component Value Date   HGBA1C 9.1 (H) 04/21/2017   HGBA1C 9.7 (H) 01/17/2017   HGBA1C 9.0 (H) 10/13/2016    Assessment & Plan:   1. Type 2 diabetes mellitus with vascular disease (HCC)  -Her diabetes is  complicated by coronary artery disease and patient remains at a high risk for more acute and chronic complications of diabetes which include CAD, CVA, CKD, retinopathy, and neuropathy. These are all discussed in detail with the patient.  Patient came  with improved A1c of 9.1% from 9.7%.  - Did better after continuous glucose monitoring sensor was given to her.  - I have re-counseled the patient on diet management and weight loss  by adopting a carbohydrate restricted / protein rich  Diet.  -  Suggestion is made for her to avoid simple carbohydrates  from her diet including Cakes, Sweet Desserts / Pastries, Ice Cream, Soda (diet and regular), Sweet Tea, Candies, Chips, Cookies, Store Bought Juices, Alcohol in Excess of  1-2 drinks a day, Artificial Sweeteners, and "Sugar-free" Products. This will help patient to have stable blood glucose profile and potentially avoid unintended weight gain.   - Patient is advised to stick to a routine mealtimes to eat 3 meals  a day and avoid unnecessary snacks ( to snack only to correct hypoglycemia).  - I have approached patient with the following individualized plan to manage diabetes and patient agrees.  -  She will continue to  require basal/bolus insulin to control diabetes.    -  I will increase Toujeo to 60 units daily at bedtime,  increase NovoLog to 10 units 3 times a day before meals  for pre-meal blood glucose above 90 mg/dL plus patient specific correction dose. She is advised to resume and continue strict monitoring of blood glucose 4 times a day-before meals and at bedtime.   -Patient is encouraged to call clinic for blood glucose levels less than 70 or above 200 mg /dl.  - She is tolerating metformin better. I advised her to maximize to 1000 mg extended release once a day after supper.  - Patient specific target  for A1c; LDL, HDL, Triglycerides, and  Waist Circumference were discussed in detail.  2) BP/HTN:  Uncontrolled.  I advised her to  continue lisinopril 10 mg by mouth every morning  along with her metoprolol 25 mg by mouth every morning.    3) Lipids/HPL:  continue statins. 4)  Weight/Diet: CDE consult in progress, exercise, and carbohydrates information provided.  5) Vitamin D deficiency She is status post therapy with vitamin D  50,000 units by mouth weekly for  12 weeks.  5) Chronic Care/Health Maintenance:  -Patient  on ACEI/ARB and Statin medications and encouraged to continue to follow up with Ophthalmology, Cardiology,  Podiatrist at least yearly or according to recommendations, and advised to  stay away from smoking. I have recommended yearly flu vaccine and pneumonia vaccination at least every 5 years; moderate intensity exercise for up to 150 minutes weekly; and  sleep for at least 7 hours a day.  - Time spent with the patient: 25 min, of which >50% was spent in reviewing her sugar logs , discussing her hypo- and hyper-glycemic episodes, reviewing her current and  previous labs and insulin doses and developing a plan to avoid hypo- and hyper-glycemia.    Follow up plan: Return in about 3 months (around 07/28/2017) for meter, and logs.  Glade Lloyd, MD Phone: [phone]  Fax:  [phone]   -  This note was partially dictated with voice recognition software. Similar sounding words can be transcribed inadequately or may not  be corrected upon review.  04/27/2017, 4:34 PM

## 2017-04-27 NOTE — Patient Instructions (Signed)

## 2017-05-01 ENCOUNTER — Other Ambulatory Visit: Payer: Self-pay

## 2017-05-01 MED ORDER — FREESTYLE LIBRE SENSOR SYSTEM MISC: 5 refills | Status: DC

## 2017-05-04 ENCOUNTER — Other Ambulatory Visit: Payer: Self-pay | Admitting: "Endocrinology

## 2017-07-01 ENCOUNTER — Other Ambulatory Visit: Payer: Self-pay | Admitting: Cardiovascular Disease

## 2017-07-07 ENCOUNTER — Other Ambulatory Visit: Payer: Self-pay | Admitting: "Endocrinology

## 2017-07-25 ENCOUNTER — Other Ambulatory Visit: Payer: Self-pay | Admitting: "Endocrinology

## 2017-07-26 ENCOUNTER — Other Ambulatory Visit: Payer: Self-pay

## 2017-07-28 LAB — COMPLETE METABOLIC PANEL WITH GFR
AG RATIO: 1.6 (calc) (ref 1.0–2.5)
ALBUMIN MSPROF: 4.1 g/dL (ref 3.6–5.1)
ALT: 20 U/L (ref 6–29)
AST: 15 U/L (ref 10–35)
Alkaline phosphatase (APISO): 89 U/L (ref 33–130)
BUN: 11 mg/dL (ref 7–25)
CALCIUM: 9.3 mg/dL (ref 8.6–10.4)
CO2: 26 mmol/L (ref 20–32)
CREATININE: 0.73 mg/dL (ref 0.50–1.05)
Chloride: 106 mmol/L (ref 98–110)
GFR, EST AFRICAN AMERICAN: 104 mL/min/{1.73_m2} (ref >=60)
GFR, EST NON AFRICAN AMERICAN: 90 mL/min/{1.73_m2} (ref >=60)
GLOBULIN: 2.5 g/dL (ref 1.9–3.7)
Glucose, Bld: 205 mg/dL — ABNORMAL HIGH (ref 65–99)
POTASSIUM: 4.4 mmol/L (ref 3.5–5.3)
Sodium: 141 mmol/L (ref 135–146)
TOTAL PROTEIN: 6.6 g/dL (ref 6.1–8.1)
Total Bilirubin: 0.5 mg/dL (ref 0.2–1.2)

## 2017-07-28 LAB — HEMOGLOBIN A1C
EAG (MMOL/L): 12.7 (calc)
HEMOGLOBIN A1C: 9.6 %{Hb} — AB (ref <5.7)
MEAN PLASMA GLUCOSE: 229 (calc)

## 2017-08-01 ENCOUNTER — Ambulatory Visit: Payer: BLUE CROSS/BLUE SHIELD | Admitting: "Endocrinology

## 2017-08-01 ENCOUNTER — Encounter: Payer: Self-pay | Admitting: "Endocrinology

## 2017-08-01 VITALS — BP 147/90 | HR 71 | Ht 61.0 in | Wt 182.0 lb

## 2017-08-01 DIAGNOSIS — E782 Mixed hyperlipidemia: Secondary | ICD-10-CM | POA: Diagnosis not present

## 2017-08-01 DIAGNOSIS — I10 Essential (primary) hypertension: Secondary | ICD-10-CM | POA: Diagnosis not present

## 2017-08-01 DIAGNOSIS — E1159 Type 2 diabetes mellitus with other circulatory complications: Secondary | ICD-10-CM

## 2017-08-01 MED ORDER — LISINOPRIL 20 MG PO TABS: 20.0000 mg | ORAL_TABLET | Freq: Every day | ORAL | 2 refills | Status: DC

## 2017-08-01 NOTE — Patient Instructions (Signed)

## 2017-08-01 NOTE — Progress Notes (Signed)
Subjective:    Patient ID: Cassie Cordova, female    DOB: 03/20/1956,    Past Medical History:  Diagnosis Date  . CAD (coronary artery disease)    a. DES to prox LAD and staged DES to [place] 2012 at [facility]. b. [place] 08/2016 s/p DES to OM3, PTCA to small distal and apical LAD too small for stenting, EF normal, diffuse small branch disease.  . Diabetes mellitus, type II ([place])   . Hyperlipidemia   . Hypertension   . Obesity    Past Surgical History:  Procedure Laterality Date  . ABDOMINAL HYSTERECTOMY    . CESAREAN SECTION    . CHOLECYSTECTOMY    . CORONARY STENT INTERVENTION N/A 09/09/2016   Procedure: Coronary Stent Intervention;  Surgeon: Burnell Blanks, MD;  Location: [facility];  Service: Cardiovascular;  Laterality: N/A;  OM3 DES 2.25x 20, POBA Distal LAD  . LEFT HEART CATH AND CORONARY ANGIOGRAPHY N/A 09/09/2016   Procedure: Left Heart Cath and Coronary Angiography;  Surgeon: Burnell Blanks, MD;  Location: [facility];  Service: Cardiovascular;  Laterality: N/A;   Social History   Socioeconomic History  . Marital status: Married    Spouse name: None  . Number of children: None  . Years of education: None  . Highest education level: None  Social Needs  . Financial resource strain: None  . Food insecurity - worry: None  . Food insecurity - inability: None  . Transportation needs - medical: None  . Transportation needs - non-medical: None  Occupational History  . None  Tobacco Use  . Smoking status: Never Smoker  . Smokeless tobacco: Never Used  Substance and Sexual Activity  . Alcohol use: No    Alcohol/week: 0.0 oz  . Drug use: No  . Sexual activity: None  Other Topics Concern  . None  Social History Narrative  . None   Outpatient Encounter Medications as of 08/01/2017  Medication Sig  . Insulin Aspart (NOVOLOG FLEXPEN [place]) Inject 15-21 Units into the skin 3 (three) times daily before meals.  Marland Kitchen acetaminophen (TYLENOL) 500 MG  tablet Take 500-1,000 mg by mouth every 6 (six) hours as needed (for pain).  Marland Kitchen aspirin EC 81 MG tablet Take 81 mg by mouth daily.  Marland Kitchen atorvastatin (LIPITOR) 80 MG tablet Take 1 tablet (80 mg total) by mouth every evening.  . blood glucose meter kit and supplies KIT Dispense based on patient and insurance preference. Use up to four times daily as directed. (FOR ICD-10 E11.65)  . Blood Glucose Monitoring Suppl (FREESTYLE LITE) DEVI 1 to test glucose 4 times a day  . co-enzyme Q-10 30 MG capsule Take 30 mg by mouth daily.  . Coenzyme Q10 (CO Q-10 PO) Take 400 mg by mouth daily.  . Continuous Blood Gluc Sensor (FREESTYLE LIBRE SENSOR SYSTEM) MISC Use one sensor every 10 days.  . Cranberry-Vitamin C-Vitamin E 4200-20-3 MG-MG-UNIT CAPS Take 1 capsule by mouth daily.  . Insulin Glargine (TOUJEO SOLOSTAR) 300 UNIT/ML SOPN Inject 60 Units at bedtime into the skin.  . INSULIN SYRINGE .5CC/29G 29G X 1/2" 0.5 ML MISC Use 3 times to inject insulin  . Lancets (FREESTYLE) lancets Use as instructed  . lisinopril (PRINIVIL,ZESTRIL) 20 MG tablet Take 1 tablet (20 mg total) by mouth daily.  . metFORMIN (GLUCOPHAGE-XR) 500 MG 24 hr tablet TAKE 2 TABLETS BY MOUTH DAILY WITH BREAKFAST  . metoprolol succinate (TOPROL-XL) 50 MG 24 hr tablet TAKE 1 TABLET BY  MOUTH EVERY MORNING PLEASE SCHEDULE AN APPOINTMENT FOR FUTURE REFILLS  . nitroGLYCERIN (NITROSTAT) 0.4 MG SL tablet Place 0.4 mg under the tongue every 5 (five) minutes as needed for chest pain.  . ticagrelor (BRILINTA) 90 MG TABS tablet Take 1 tablet (90 mg total) by mouth 2 (two) times daily.  Marland Kitchen venlafaxine XR (EFFEXOR-XR) 75 MG 24 hr capsule Take 75 mg by mouth daily with breakfast.  . [DISCONTINUED] insulin aspart (NOVOLOG FLEXPEN) 100 UNIT/ML FlexPen Inject 10-16 Units into the skin 3 (three) times daily with meals.  . [DISCONTINUED] lisinopril (PRINIVIL,ZESTRIL) 10 MG tablet Take 1 tablet (10 mg total) by mouth daily.   No facility-administered encounter  medications on file as of 08/01/2017.    ALLERGIES: No Known Allergies VACCINATION STATUS:  There is no immunization history on file for this patient.  Diabetes  She presents for her follow-up diabetic visit. She has type 2 diabetes mellitus. Onset time: She was diagnosed at approximate age of 62 years. Her disease course has been worsening. There are no hypoglycemic associated symptoms. Pertinent negatives for hypoglycemia include no confusion, headaches, pallor or seizures. Associated symptoms include fatigue, polydipsia and polyuria. Pertinent negatives for diabetes include no chest pain and no polyphagia. There are no hypoglycemic complications. Symptoms are worsening. Diabetic complications include heart disease. Risk factors for coronary artery disease include diabetes mellitus, dyslipidemia, hypertension, sedentary lifestyle and obesity. Current diabetic treatment includes insulin injections and oral agent (monotherapy). She is compliant with treatment some of the time (She admits inconsistency in taking her invokana and Trulicity. She also missed some doses of Toujeo as well.). Her weight is increasing steadily. She is following a generally unhealthy diet. When asked about meal planning, she reported none. She has had a previous visit with a dietitian. She rarely participates in exercise. Her home blood glucose trend is increasing steadily. Her breakfast blood glucose range is generally >200 mg/dl. Her lunch blood glucose range is generally 180-200 mg/dl. Her dinner blood glucose range is generally 180-200 mg/dl. Her bedtime blood glucose range is generally >200 mg/dl. Her overall blood glucose range is >200 mg/dl. (Despite the fact that she was put on continuous glucose sensor, she is not using it properly.  She misses at least half of her prandial insulin injection opportunities, mainly because she does not eat a complete meal.  She snacks randomly and eats a large dinner reading to high overnight  and early morning readings.  Her A1c is higher at 9.6%.  -Her CGM analysis shows only 23% of the times within range, 77% of the time hyperglycemic, and 0% hypoglycemia.  Her average blood glucose is 230 mg/dL. ) An ACE inhibitor/angiotensin II receptor blocker is being taken. Eye exam is current.  Hypertension  This is a chronic problem. The problem is uncontrolled. Pertinent negatives include no chest pain, headaches, palpitations or shortness of breath. Risk factors for coronary artery disease include dyslipidemia, diabetes mellitus and sedentary lifestyle. Past treatments include ACE inhibitors. The current treatment provides no improvement.  Hyperlipidemia  This is a chronic problem. The current episode started more than 1 year ago. The problem is controlled. Recent lipid tests were reviewed and are normal. Exacerbating diseases include diabetes and obesity. Pertinent negatives include no chest pain, myalgias or shortness of breath. Current antihyperlipidemic treatment includes statins. Risk factors for coronary artery disease include dyslipidemia, diabetes mellitus, hypertension and obesity.    Review of Systems  Constitutional: Positive for fatigue. Negative for unexpected weight change.  HENT: Negative for trouble swallowing  and voice change.   Eyes: Negative for visual disturbance.  Respiratory: Negative for cough, shortness of breath and wheezing.   Cardiovascular: Negative for chest pain, palpitations and leg swelling.  Gastrointestinal: Negative for diarrhea, nausea and vomiting.  Endocrine: Positive for polydipsia and polyuria. Negative for cold intolerance, heat intolerance and polyphagia.  Musculoskeletal: Negative for arthralgias and myalgias.  Skin: Negative for color change, pallor, rash and wound.  Neurological: Negative for seizures and headaches.  Psychiatric/Behavioral: Negative for confusion and suicidal ideas.    Objective:    BP (!) 147/90   Pulse 71   Ht '5\' 1"'$   (1.549 m)   Wt 182 lb (82.6 kg)   BMI 34.39 kg/m   Wt Readings from Last 3 Encounters:  08/01/17 182 lb (82.6 kg)  04/27/17 176 lb (79.8 kg)  01/24/17 175 lb (79.4 kg)    Physical Exam  Constitutional: She is oriented to person, place, and time. She appears well-developed.  HENT:  Head: Normocephalic and atraumatic.  Eyes: EOM are normal.  Neck: Normal range of motion. Neck supple. No tracheal deviation present. No thyromegaly present.  Cardiovascular: Normal rate and regular rhythm.  Pulmonary/Chest: Effort normal and breath sounds normal.  Abdominal: Soft. Bowel sounds are normal. There is no tenderness. There is no guarding.  Musculoskeletal: Normal range of motion. She exhibits no edema.  Neurological: She is alert and oriented to person, place, and time. She has normal reflexes. No cranial nerve deficit. Coordination normal.  Skin: Skin is warm and dry. No rash noted. No erythema. No pallor.  Psychiatric: She has a normal mood and affect. Judgment normal.    Results for orders placed or performed in visit on 04/27/17  COMPLETE METABOLIC PANEL WITH GFR  Result Value Ref Range   Glucose, Bld 205 (H) 65 - 99 mg/dL   BUN 11 7 - 25 mg/dL   Creat 0.73 0.50 - 1.05 mg/dL   GFR, Est Non African American 90 > OR = 60 mL/min/1.46m   GFR, Est African American 104 > OR = 60 mL/min/1.768m  BUN/Creatinine Ratio NOT APPLICABLE 6 - 22 (calc)   Sodium 141 135 - 146 mmol/L   Potassium 4.4 3.5 - 5.3 mmol/L   Chloride 106 98 - 110 mmol/L   CO2 26 20 - 32 mmol/L   Calcium 9.3 8.6 - 10.4 mg/dL   Total Protein 6.6 6.1 - 8.1 g/dL   Albumin 4.1 3.6 - 5.1 g/dL   Globulin 2.5 1.9 - 3.7 g/dL (calc)   AG Ratio 1.6 1.0 - 2.5 (calc)   Total Bilirubin 0.5 0.2 - 1.2 mg/dL   Alkaline phosphatase (APISO) 89 33 - 130 U/L   AST 15 10 - 35 U/L   ALT 20 6 - 29 U/L  Hemoglobin A1c  Result Value Ref Range   Hgb A1c MFr Bld 9.6 (H) <5.7 % of total Hgb   Mean Plasma Glucose 229 (calc)   eAG (mmol/L)  12.7 (calc)   Complete Blood Count (Most recent): Lab Results  Component Value Date   WBC 5.0 09/10/2016   HGB 13.4 09/10/2016   HCT 39.9 09/10/2016   MCV 93.4 09/10/2016   PLT 194 09/10/2016   Diabetic Labs (most recent): Lab Results  Component Value Date   HGBA1C 9.6 (H) 07/27/2017   HGBA1C 9.1 (H) 04/21/2017   HGBA1C 9.7 (H) 01/17/2017   Lipid Panel     Component Value Date/Time   CHOL 159 10/13/2016 0749   TRIG 190 (H) 10/13/2016 0749  HDL 50 (L) 10/13/2016 0749   CHOLHDL 3.2 10/13/2016 0749   VLDL 38 (H) 10/13/2016 0749   LDLCALC 71 10/13/2016 0749    Assessment & Plan:   1. Type 2 diabetes mellitus with vascular disease (HCC)  -Her diabetes is  complicated by coronary artery disease and patient remains at a high risk for more acute and chronic complications of diabetes which include CAD, CVA, CKD, retinopathy, and neuropathy. These are all discussed in detail with the patient.  Patient came with loss of control of diabetes with A1c 9.6%, increasing from 9.1%.  -She has not used her CGM device properly.  -She missed at least half of her prandial insulin injection opportunities.   - I have re-counseled the patient on diet management and weight loss  by adopting a carbohydrate restricted / protein rich  Diet.  -  Suggestion is made for her to avoid simple carbohydrates  from her diet including Cakes, Sweet Desserts / Pastries, Ice Cream, Soda (diet and regular), Sweet Tea, Candies, Chips, Cookies, Store Bought Juices, Alcohol in Excess of  1-2 drinks a day, Artificial Sweeteners, and "Sugar-free" Products. This will help patient to have stable blood glucose profile and potentially avoid unintended weight gain.  - Patient is advised to stick to a routine mealtimes to eat 3 meals  a day and avoid unnecessary snacks ( to snack only to correct hypoglycemia).  - I have approached patient with the following individualized plan to manage diabetes and patient agrees.  -   She is reapproached for better engagement.  -  I will continue Toujeo  60 units daily at bedtime,  increase NovoLog to 15 units 3 times a day before meals  for pre-meal blood glucose above 90 mg/dL plus patient specific correction dose. She is advised to resume and continue strict documentation of blood glucose 4 times a day-before meals and at bedtime.   -Patient is encouraged to call clinic for blood glucose levels less than 70 or above 200 mg /dl.  - She is tolerating metformin better. I advised her to  1000 mg extended release once a day after supper.  - Patient specific target  for A1c; LDL, HDL, Triglycerides, and  Waist Circumference were discussed in detail.  2) BP/HTN: Her blood pressure is not controlled to target.  I increased her lisinopril to 20 mg p.o. daily, advised her to continue metoprolol 50 mg extended release once a day in the morning.  3) Lipids/HPL: Recent labs showed controlled LDL at 71, continue statins. 4)  Weight/Diet: CDE consult in progress, exercise, and carbohydrates information provided.  5) Chronic Care/Health Maintenance:  -Patient  on ACEI/ARB and Statin medications and encouraged to continue to follow up with Ophthalmology, Cardiology,  Podiatrist at least yearly or according to recommendations, and advised to  stay away from smoking. I have recommended yearly flu vaccine and pneumonia vaccination at least every 5 years; and  sleep for at least 7 hours a day.  - Time spent with the patient: 25 min, of which >50% was spent in reviewing her blood glucose logs , discussing her hypo- and hyper-glycemic episodes, reviewing her current and  previous labs and insulin doses and developing a plan to avoid hypo- and hyper-glycemia. Please refer to Patient Instructions for Blood Glucose Monitoring and Insulin/Medications Dosing Guide"  in media tab for additional information.  Follow up plan: Return in about 3 months (around 10/29/2017) for follow up with pre-visit labs,  meter, and logs.  Glade Lloyd, MD Phone:  [phone]  Fax: [phone]   -  This note was partially dictated with voice recognition software. Similar sounding words can be transcribed inadequately or may not  be corrected upon review.  08/01/2017, 3:31 PM

## 2017-08-31 ENCOUNTER — Other Ambulatory Visit: Payer: Self-pay | Admitting: "Endocrinology

## 2017-08-31 MED ORDER — FREESTYLE LIBRE 14 DAY READER DEVI: 1.0000 | Freq: Once | 0 refills | Status: AC

## 2017-08-31 MED ORDER — FREESTYLE LIBRE 14 DAY SENSOR MISC: 1.0000 | 2 refills | Status: DC

## 2017-09-06 ENCOUNTER — Other Ambulatory Visit: Payer: Self-pay | Admitting: "Endocrinology

## 2017-09-23 ENCOUNTER — Other Ambulatory Visit: Payer: Self-pay | Admitting: Cardiovascular Disease

## 2017-10-15 ENCOUNTER — Other Ambulatory Visit: Payer: Self-pay | Admitting: "Endocrinology

## 2017-10-25 LAB — COMPLETE METABOLIC PANEL WITH GFR
AG Ratio: 1.9 (calc) (ref 1.0–2.5)
ALT: 18 U/L (ref 6–29)
AST: 14 U/L (ref 10–35)
Albumin: 4.1 g/dL (ref 3.6–5.1)
Alkaline phosphatase (APISO): 83 U/L (ref 33–130)
BILIRUBIN TOTAL: 0.5 mg/dL (ref 0.2–1.2)
BUN: 14 mg/dL (ref 7–25)
CALCIUM: 9.2 mg/dL (ref 8.6–10.4)
CHLORIDE: 106 mmol/L (ref 98–110)
CO2: 29 mmol/L (ref 20–32)
Creat: 0.87 mg/dL (ref 0.50–0.99)
GFR, EST AFRICAN AMERICAN: 84 mL/min/{1.73_m2} (ref >=60)
GFR, Est Non African American: 72 mL/min/{1.73_m2} (ref >=60)
GLUCOSE: 230 mg/dL — AB (ref 65–99)
Globulin: 2.2 g/dL (calc) (ref 1.9–3.7)
Potassium: 4.6 mmol/L (ref 3.5–5.3)
Sodium: 139 mmol/L (ref 135–146)
Total Protein: 6.3 g/dL (ref 6.1–8.1)

## 2017-10-25 LAB — HEMOGLOBIN A1C
Hgb A1c MFr Bld: 8.8 % of total Hgb — ABNORMAL HIGH (ref <5.7)
Mean Plasma Glucose: 206 (calc)
eAG (mmol/L): 11.4 (calc)

## 2017-10-31 ENCOUNTER — Ambulatory Visit: Payer: BLUE CROSS/BLUE SHIELD | Admitting: "Endocrinology

## 2017-10-31 ENCOUNTER — Encounter: Payer: Self-pay | Admitting: "Endocrinology

## 2017-10-31 VITALS — BP 135/71 | HR 74 | Ht 61.0 in | Wt 188.0 lb

## 2017-10-31 DIAGNOSIS — E1159 Type 2 diabetes mellitus with other circulatory complications: Secondary | ICD-10-CM

## 2017-10-31 DIAGNOSIS — E782 Mixed hyperlipidemia: Secondary | ICD-10-CM

## 2017-10-31 DIAGNOSIS — I10 Essential (primary) hypertension: Secondary | ICD-10-CM | POA: Diagnosis not present

## 2017-10-31 MED ORDER — INSULIN GLARGINE 300 UNIT/ML ~~LOC~~ SOPN: 70.0000 [IU] | PEN_INJECTOR | Freq: Every day | SUBCUTANEOUS | 2 refills | Status: DC

## 2017-10-31 NOTE — Patient Instructions (Signed)

## 2017-10-31 NOTE — Progress Notes (Signed)
Subjective:    Patient ID: Cassie Cordova, female    DOB: [birth year],    Past Medical History:  Diagnosis Date  . CAD (coronary artery disease)    a. DES to prox LAD and staged DES to [place] 2012 at [facility]. b. [place] 08/2016 s/p DES to OM3, PTCA to small distal and apical LAD too small for stenting, EF normal, diffuse small branch disease.  . Diabetes mellitus, type II ([place])   . Hyperlipidemia   . Hypertension   . Obesity    Past Surgical History:  Procedure Laterality Date  . ABDOMINAL HYSTERECTOMY    . CESAREAN SECTION    . CHOLECYSTECTOMY    . CORONARY STENT INTERVENTION N/A 09/09/2016   Procedure: Coronary Stent Intervention;  Surgeon: Burnell Blanks, MD;  Location: [facility];  Service: Cardiovascular;  Laterality: N/A;  OM3 DES 2.25x 20, POBA Distal LAD  . LEFT HEART CATH AND CORONARY ANGIOGRAPHY N/A 09/09/2016   Procedure: Left Heart Cath and Coronary Angiography;  Surgeon: Burnell Blanks, MD;  Location: [place] [facility];  Service: Cardiovascular;  Laterality: N/A;   Social History   Socioeconomic History  . Marital status: Married    Spouse name: Not on file  . Number of children: Not on file  . Years of education: Not on file  . Highest education level: Not on file  Occupational History  . Not on file  Social Needs  . Financial resource strain: Not on file  . Food insecurity:    Worry: Not on file    Inability: Not on file  . Transportation needs:    Medical: Not on file    Non-medical: Not on file  Tobacco Use  . Smoking status: Never Smoker  . Smokeless tobacco: Never Used  Substance and Sexual Activity  . Alcohol use: No    Alcohol/week: 0.0 oz  . Drug use: No  . Sexual activity: Not on file  Lifestyle  . Physical activity:    Days per week: Not on file    Minutes per session: Not on file  . Stress: Not on file  Relationships  . Social connections:    Talks on phone: Not on file    Gets together: Not on file    Attends  religious service: Not on file    Active member of club or organization: Not on file    Attends meetings of clubs or organizations: Not on file    Relationship status: Not on file  Other Topics Concern  . Not on file  Social History Narrative  . Not on file   Outpatient Encounter Medications as of 10/31/2017  Medication Sig  . acetaminophen (TYLENOL) 500 MG tablet Take 500-1,000 mg by mouth every 6 (six) hours as needed (for pain).  Marland Kitchen aspirin EC 81 MG tablet Take 81 mg by mouth daily.  Marland Kitchen atorvastatin (LIPITOR) 80 MG tablet Take 1 tablet (80 mg total) by mouth every evening. Please keep upcoming appt for future refills. Thank you  . Blood Glucose Monitoring Suppl (FREESTYLE LITE) DEVI 1 to test glucose 4 times a day  . co-enzyme Q-10 30 MG capsule Take 30 mg by mouth daily.  . Cranberry-Vitamin C-Vitamin E 4200-20-3 MG-MG-UNIT CAPS Take 1 capsule by mouth daily.  . Insulin Aspart (NOVOLOG FLEXPEN [facility]) Inject 15-21 Units into the skin 3 (three) times daily before meals.  . Insulin Glargine (TOUJEO SOLOSTAR) 300 UNIT/ML SOPN Inject 70 Units into the skin at bedtime.  Marland Kitchen  INSULIN SYRINGE .5CC/29G 29G X 1/2" 0.5 ML MISC Use 3 times to inject insulin  . isosorbide mononitrate (IMDUR) 30 MG 24 hr tablet TAKE 1 TABLET(30 MG TOTAL) BY MOUTH DAILY  . Lancets (FREESTYLE) lancets Use as instructed  . lisinopril (PRINIVIL,ZESTRIL) 20 MG tablet Take 1 tablet (20 mg total) by mouth daily.  . metFORMIN (GLUCOPHAGE-XR) 500 MG 24 hr tablet TAKE 2 TABLETS BY MOUTH DAILY WITH BREAKFAST  . metoprolol succinate (TOPROL-XL) 50 MG 24 hr tablet TAKE 1 TABLET BY MOUTH EVERY MORNING. PLEASE SCHEDULE AN APPOINTMENT FOR FUTURE REFILLS  . nitroGLYCERIN (NITROSTAT) 0.4 MG SL tablet Place 0.4 mg under the tongue every 5 (five) minutes as needed for chest pain.  . ticagrelor (BRILINTA) 90 MG TABS tablet Take 1 tablet (90 mg total) by mouth 2 (two) times daily.  Marland Kitchen venlafaxine XR (EFFEXOR-XR) 75 MG 24 hr capsule Take 75 mg by  mouth daily with breakfast.  . [DISCONTINUED] blood glucose meter kit and supplies KIT Dispense based on patient and insurance preference. Use up to four times daily as directed. (FOR ICD-10 E11.65)  . [DISCONTINUED] Coenzyme Q10 (CO Q-10 PO) Take 400 mg by mouth daily.  . [DISCONTINUED] Continuous Blood Gluc Sensor (FREESTYLE LIBRE 14 DAY SENSOR) MISC Inject 1 each into the skin every 14 (fourteen) days. Use as directed.  . [DISCONTINUED] Continuous Blood Gluc Sensor (FREESTYLE LIBRE SENSOR SYSTEM) MISC Use one sensor every 10 days.  . [DISCONTINUED] TOUJEO SOLOSTAR 300 UNIT/ML SOPN INJECT 60 UNITS UNDER THE SKIN AT BEDTIME   No facility-administered encounter medications on file as of 10/31/2017.    ALLERGIES: No Known Allergies VACCINATION STATUS:  There is no immunization history on file for this patient.  Diabetes  She presents for her follow-up diabetic visit. She has type 2 diabetes mellitus. Onset time: She was diagnosed at approximate age of 34 years. Her disease course has been worsening. There are no hypoglycemic associated symptoms. Pertinent negatives for hypoglycemia include no confusion, headaches, pallor or seizures. Associated symptoms include fatigue, polydipsia and polyuria. Pertinent negatives for diabetes include no chest pain and no polyphagia. There are no hypoglycemic complications. Symptoms are worsening. Diabetic complications include heart disease. Risk factors for coronary artery disease include diabetes mellitus, dyslipidemia, hypertension, sedentary lifestyle and obesity. Current diabetic treatment includes insulin injections and oral agent (monotherapy). She is compliant with treatment some of the time (She admits inconsistency in taking her invokana and Trulicity. She also missed some doses of Toujeo as well.). Her weight is increasing steadily. She is following a generally unhealthy diet. When asked about meal planning, she reported none. She has had a previous visit  with a dietitian. She rarely participates in exercise. Blood glucose monitoring compliance is inadequate. Her home blood glucose trend is increasing steadily. Her breakfast blood glucose range is generally >200 mg/dl. Her lunch blood glucose range is generally >200 mg/dl. Her dinner blood glucose range is generally >200 mg/dl. Her bedtime blood glucose range is generally >200 mg/dl. Her overall blood glucose range is >200 mg/dl. (Due to some skin reaction, she decided not to use her Allison device.  She has not been consistent using her meter to monitor adequately.  Even when she was using the [place] her average blood glucose was 217, 27% time in range and 72% above target.) An ACE inhibitor/angiotensin II receptor blocker is being taken. Eye exam is current.  Hypertension  This is a chronic problem. The problem is controlled. Pertinent negatives include no chest pain, headaches, palpitations or  shortness of breath. Risk factors for coronary artery disease include dyslipidemia, diabetes mellitus, sedentary lifestyle, obesity and post-menopausal state. Past treatments include ACE inhibitors. The current treatment provides no improvement.  Hyperlipidemia  This is a chronic problem. The current episode started more than 1 year ago. The problem is controlled. Recent lipid tests were reviewed and are normal. Exacerbating diseases include diabetes and obesity. Pertinent negatives include no chest pain, myalgias or shortness of breath. Current antihyperlipidemic treatment includes statins. Risk factors for coronary artery disease include dyslipidemia, diabetes mellitus, hypertension, obesity, a sedentary lifestyle and post-menopausal.    Review of Systems  Constitutional: Positive for fatigue. Negative for unexpected weight change.  HENT: Negative for trouble swallowing and voice change.   Eyes: Negative for visual disturbance.  Respiratory: Negative for cough, shortness of breath and wheezing.   Cardiovascular:  Negative for chest pain, palpitations and leg swelling.  Gastrointestinal: Negative for diarrhea, nausea and vomiting.  Endocrine: Positive for polydipsia and polyuria. Negative for cold intolerance, heat intolerance and polyphagia.  Musculoskeletal: Negative for arthralgias and myalgias.  Skin: Negative for color change, pallor, rash and wound.  Neurological: Negative for seizures and headaches.  Psychiatric/Behavioral: Negative for confusion and suicidal ideas.    Objective:    BP 135/71   Pulse 74   Ht '5\' 1"'$  (1.549 m)   Wt 188 lb (85.3 kg)   BMI 35.52 kg/m   Wt Readings from Last 3 Encounters:  10/31/17 188 lb (85.3 kg)  08/01/17 182 lb (82.6 kg)  04/27/17 176 lb (79.8 kg)    Physical Exam  Constitutional: She is oriented to person, place, and time. She appears well-developed.  HENT:  Head: Normocephalic and atraumatic.  Eyes: EOM are normal.  Neck: Normal range of motion. Neck supple. No tracheal deviation present. No thyromegaly present.  Cardiovascular: Normal rate and regular rhythm.  Pulmonary/Chest: Effort normal and breath sounds normal.  Abdominal: Soft. Bowel sounds are normal. There is no tenderness. There is no guarding.  Musculoskeletal: Normal range of motion. She exhibits no edema.  Neurological: She is alert and oriented to person, place, and time. She has normal reflexes. No cranial nerve deficit. Coordination normal.  Skin: Skin is warm and dry. No rash noted. No erythema. No pallor.  Psychiatric: She has a normal mood and affect. Judgment normal.    Results for orders placed or performed in visit on 08/01/17  COMPLETE METABOLIC PANEL WITH GFR  Result Value Ref Range   Glucose, Bld 230 (H) 65 - 99 mg/dL   BUN 14 7 - 25 mg/dL   Creat 0.87 0.50 - 0.99 mg/dL   GFR, Est Non African American 72 > OR = 60 mL/min/1.60m   GFR, Est African American 84 > OR = 60 mL/min/1.763m  BUN/Creatinine Ratio NOT APPLICABLE 6 - 22 (calc)   Sodium 139 135 - 146 mmol/L    Potassium 4.6 3.5 - 5.3 mmol/L   Chloride 106 98 - 110 mmol/L   CO2 29 20 - 32 mmol/L   Calcium 9.2 8.6 - 10.4 mg/dL   Total Protein 6.3 6.1 - 8.1 g/dL   Albumin 4.1 3.6 - 5.1 g/dL   Globulin 2.2 1.9 - 3.7 g/dL (calc)   AG Ratio 1.9 1.0 - 2.5 (calc)   Total Bilirubin 0.5 0.2 - 1.2 mg/dL   Alkaline phosphatase (APISO) 83 33 - 130 U/L   AST 14 10 - 35 U/L   ALT 18 6 - 29 U/L  Hemoglobin A1c  Result Value Ref  Range   Hgb A1c MFr Bld 8.8 (H) <5.7 % of total Hgb   Mean Plasma Glucose 206 (calc)   eAG (mmol/L) 11.4 (calc)   Complete Blood Count (Most recent): Lab Results  Component Value Date   WBC 5.0 09/10/2016   HGB 13.4 09/10/2016   HCT 39.9 09/10/2016   MCV 93.4 09/10/2016   PLT 194 09/10/2016   Diabetic Labs (most recent): Lab Results  Component Value Date   HGBA1C 8.8 (H) 10/24/2017   HGBA1C 9.6 (H) 07/27/2017   HGBA1C 9.1 (H) 04/21/2017   Lipid Panel     Component Value Date/Time   CHOL 159 10/13/2016 0749   TRIG 190 (H) 10/13/2016 0749   HDL 50 (L) 10/13/2016 0749   CHOLHDL 3.2 10/13/2016 0749   VLDL 38 (H) 10/13/2016 0749   LDLCALC 71 10/13/2016 0749    Assessment & Plan:   1. Type 2 diabetes mellitus with vascular disease (HCC)  -Her diabetes is  complicated by coronary artery disease and patient remains at a high risk for more acute and chronic complications of diabetes which include CAD, CVA, CKD, retinopathy, and neuropathy. These are all discussed in detail with the patient.  Patient came with slight improvement in her A1c to 8.8% from 9.6%.   -She has not used her CGM device properly, until she stopped it altogether due to some skin reaction. -Her insulin administration activities are on and off, and follows random meal/injection timing.   - I have re-counseled the patient on diet management and weight loss  by adopting a carbohydrate restricted / protein rich  Diet.  -  Suggestion is made for her to avoid simple carbohydrates  from her diet  including Cakes, Sweet Desserts / Pastries, Ice Cream, Soda (diet and regular), Sweet Tea, Candies, Chips, Cookies, Store Bought Juices, Alcohol in Excess of  1-2 drinks a day, Artificial Sweeteners, and "Sugar-free" Products. This will help patient to have stable blood glucose profile and potentially avoid unintended weight gain.  - Patient is advised to stick to a routine mealtimes to eat 3 meals  a day and avoid unnecessary snacks ( to snack only to correct hypoglycemia).  - I have approached patient with the following individualized plan to manage diabetes and patient agrees.  -  She is reapproached for better engagement.  Due to significant noncoverage of her meals and inadequate documentation of her basal insulin, it is difficult to adjust her insulin safely without risking inadvertent hypoglycemia. -  I I approached her for higher dose of Toujeo at 70 units nightly, continue NovoLog  15 units 3 times a day before meals  for pre-meal blood glucose above 90 mg/dL plus patient specific correction dose. -Since she has discontinued her CGM device, she is advised to resume and continue strict documentation of blood glucose 4 times a day-before meals and at bedtime.  -Patient is encouraged to call clinic for blood glucose levels less than 70 or above 200 mg /dl.  - She is tolerating metformin better. I advised her to  1000 mg extended release once a day after supper.  - Patient specific target  for A1c; LDL, HDL, Triglycerides, and  Waist Circumference were discussed in detail.  2) BP/HTN: Her blood pressure is controlled to target.  She is advised to continue  lisinopril  20 mg p.o. daily, advised her to continue metoprolol 50 mg extended release once a day in the morning.  3) Lipids/HPL: Recent labs showed controlled LDL at 71.  She is  advised to continue atorvastatin 80 mg p.o. nightly.    4)  Weight/Diet: CDE consult in progress, exercise, and carbohydrates information provided.  5) Chronic  Care/Health Maintenance:  -Patient  on ACEI/ARB and Statin medications and encouraged to continue to follow up with Ophthalmology, Cardiology,  Podiatrist at least yearly or according to recommendations, and advised to  stay away from smoking. I have recommended yearly flu vaccine and pneumonia vaccination at least every 5 years; and  sleep for at least 7 hours a day.  She has complaints of fatigue, dyspnea on exertion.  She has a cardiology appointment coming up.  I advised her to raise this with them given the fact that she has established coronary artery disease.  She is advised to go to ER if she has chest pain.   - Time spent with the patient: 25 min, of which >50% was spent in reviewing her blood glucose logs , discussing her hypo- and hyper-glycemic episodes, reviewing her current and  previous labs and insulin doses and developing a plan to avoid hypo- and hyper-glycemia. Please refer to Patient Instructions for Blood Glucose Monitoring and Insulin/Medications Dosing Guide"  in media tab for additional information. Cassie Cordova participated in the discussions, expressed understanding, and voiced agreement with the above plans.  All questions were answered to her satisfaction. she is encouraged to contact clinic should she have any questions or concerns prior to her return visit.  Follow up plan: Return in about 3 months (around 01/31/2018) for follow up with pre-visit labs, meter, and logs.  Glade Lloyd, MD Phone: [phone]  Fax: [phone]   -  This note was partially dictated with voice recognition software. Similar sounding words can be transcribed inadequately or may not  be corrected upon review.  10/31/2017, 4:30 PM

## 2017-11-01 ENCOUNTER — Ambulatory Visit: Payer: BLUE CROSS/BLUE SHIELD | Admitting: Cardiovascular Disease

## 2017-11-24 ENCOUNTER — Other Ambulatory Visit: Payer: Self-pay | Admitting: "Endocrinology

## 2017-11-27 ENCOUNTER — Ambulatory Visit: Payer: BLUE CROSS/BLUE SHIELD | Admitting: Cardiovascular Disease

## 2017-11-27 ENCOUNTER — Other Ambulatory Visit: Payer: Self-pay

## 2017-11-27 ENCOUNTER — Encounter: Payer: Self-pay | Admitting: Cardiovascular Disease

## 2017-11-27 VITALS — BP 138/70 | HR 62 | Ht 61.0 in | Wt 184.0 lb

## 2017-11-27 DIAGNOSIS — I2510 Atherosclerotic heart disease of native coronary artery without angina pectoris: Secondary | ICD-10-CM

## 2017-11-27 DIAGNOSIS — I10 Essential (primary) hypertension: Secondary | ICD-10-CM | POA: Diagnosis not present

## 2017-11-27 DIAGNOSIS — E7800 Pure hypercholesterolemia, unspecified: Secondary | ICD-10-CM | POA: Diagnosis not present

## 2017-11-27 MED ORDER — INSULIN ASPART 100 UNIT/ML FLEXPEN: 15.0000 [IU] | PEN_INJECTOR | Freq: Three times a day (TID) | SUBCUTANEOUS | 0 refills | Status: DC

## 2017-11-27 NOTE — Progress Notes (Signed)
Chief Complaint  Patient presents with  . Follow-up    CAD   History of Present Illness: 62 yo female with history of CAD, DM, HTN, HLD who is here today for cardiac follow up. She had an MI in 2012 at [facility] and had a drug eluting stent placed in the proximal LAD (Xience 2.5 x 23mm) and staged PCI of the left PDA (Resolute 2.5 x 12 mm). She was seen by Dr. Allyson Sabal 09/06/16 with c/o dyspnea and chest pain. She was admitted to [facility] 09/08/16 and underwent cardiac cath 09/09/16 which showed patent stents in the proximal LAD and Circumflex. There was a severe stenosis in the third OM branch that was treated with a Synergy drug eluting stent (Synergy 2.5 x 20 mm). The distal LAD had a severe stenosis treated with balloon angioplasty only. Vessel too small for stenting. Normal LV function by cath. Echo 09/09/16 with normal LV systolic function, mild MR.   She is here today for follow up. The patient denies any chest pain, dyspnea, palpitations, lower extremity edema, orthopnea, PND, dizziness, near syncope or syncope. She has ongoing fatigue and memory loss. She notes muscle aches on the statin.   Primary Care Physician: Carylon Perches, MD  Past Medical History:  Diagnosis Date  . CAD (coronary artery disease)    a. DES to prox LAD and staged DES to LPDA 2012 at [facility]. b. [place] 08/2016 s/p DES to OM3, PTCA to small distal and apical LAD too small for stenting, EF normal, diffuse small branch disease.  . Diabetes mellitus, type II (HCC)   . Hyperlipidemia   . Hypertension   . Obesity     Past Surgical History:  Procedure Laterality Date  . ABDOMINAL HYSTERECTOMY    . CESAREAN SECTION    . CHOLECYSTECTOMY    . CORONARY STENT INTERVENTION N/A 09/09/2016   Procedure: Coronary Stent Intervention;  Surgeon: Kathleene Hazel, MD;  Location: [facility] INVASIVE CV LAB;  Service: Cardiovascular;  Laterality: N/A;  OM3 DES 2.25x 20, POBA Distal LAD  . LEFT HEART CATH AND CORONARY ANGIOGRAPHY N/A 09/09/2016   Procedure: Left Heart Cath and Coronary Angiography;  Surgeon: Kathleene Hazel, MD;  Location: [facility] INVASIVE CV LAB;  Service: Cardiovascular;  Laterality: N/A;    Current Outpatient Medications  Medication Sig Dispense Refill  . acetaminophen (TYLENOL) 500 MG tablet Take 500-1,000 mg by mouth every 6 (six) hours as needed (for pain).    Marland Kitchen aspirin EC 81 MG tablet Take 81 mg by mouth daily.    Marland Kitchen atorvastatin (LIPITOR) 80 MG tablet Take 1 tablet (80 mg total) by mouth every evening. Please keep upcoming appt for future refills. Thank you 90 tablet 0  . Blood Glucose Monitoring Suppl (FREESTYLE LITE) DEVI 1 to test glucose 4 times a day 1 each prn  . co-enzyme Q-10 30 MG capsule Take 30 mg by mouth daily.    . Cranberry-Vitamin C-Vitamin E 4200-20-3 MG-MG-UNIT CAPS Take 1 capsule by mouth daily.    . insulin aspart (NOVOLOG FLEXPEN) 100 UNIT/ML FlexPen Inject 15-21 Units into the skin 3 (three) times daily with meals. 60 mL 0  . Insulin Glargine (TOUJEO SOLOSTAR) 300 UNIT/ML SOPN Inject 70 Units into the skin at bedtime. 9 mL 2  . INSULIN SYRINGE .5CC/29G 29G X 1/2" 0.5 ML MISC Use 3 times to inject insulin 100 each 2  . isosorbide mononitrate (IMDUR) 30 MG 24 hr tablet TAKE 1 TABLET(30 MG TOTAL) BY MOUTH DAILY 90 tablet  0  . Lancets (FREESTYLE) lancets Use as instructed 150 each 3  . lisinopril (PRINIVIL,ZESTRIL) 20 MG tablet Take 1 tablet (20 mg total) by mouth daily. 90 tablet 2  . metFORMIN (GLUCOPHAGE-XR) 500 MG 24 hr tablet TAKE 2 TABLETS BY MOUTH DAILY WITH BREAKFAST 180 tablet 0  . metoprolol succinate (TOPROL-XL) 50 MG 24 hr tablet TAKE 1 TABLET BY MOUTH EVERY MORNING. PLEASE SCHEDULE AN APPOINTMENT FOR FUTURE REFILLS 90 tablet 0  . nitroGLYCERIN (NITROSTAT) 0.4 MG SL tablet Place 0.4 mg under the tongue every 5 (five) minutes as needed for chest pain.    Marland Kitchen venlafaxine XR (EFFEXOR-XR) 75 MG 24 hr capsule Take 75 mg by mouth daily with breakfast.     No current facility-administered  medications for this visit.     No Known Allergies  Social History   Socioeconomic History  . Marital status: Married    Spouse name: Not on file  . Number of children: Not on file  . Years of education: Not on file  . Highest education level: Not on file  Occupational History  . Not on file  Social Needs  . Financial resource strain: Not on file  . Food insecurity:    Worry: Not on file    Inability: Not on file  . Transportation needs:    Medical: Not on file    Non-medical: Not on file  Tobacco Use  . Smoking status: Never Smoker  . Smokeless tobacco: Never Used  Substance and Sexual Activity  . Alcohol use: No    Alcohol/week: 0.0 oz  . Drug use: No  . Sexual activity: Not on file  Lifestyle  . Physical activity:    Days per week: Not on file    Minutes per session: Not on file  . Stress: Not on file  Relationships  . Social connections:    Talks on phone: Not on file    Gets together: Not on file    Attends religious service: Not on file    Active member of club or organization: Not on file    Attends meetings of clubs or organizations: Not on file    Relationship status: Not on file  . Intimate partner violence:    Fear of current or ex partner: Not on file    Emotionally abused: Not on file    Physically abused: Not on file    Forced sexual activity: Not on file  Other Topics Concern  . Not on file  Social History Narrative  . Not on file    Family History  Problem Relation Age of Onset  . Heart attack Other   . Diabetes Other     Review of Systems:  As stated in the HPI and otherwise negative.   BP 138/70   Pulse 62   Ht 5\' 1"  (1.549 m)   Wt 184 lb (83.5 kg)   SpO2 95%   BMI 34.77 kg/m   Physical Examination:  General: Well developed, well nourished, NAD  HEENT: OP clear, mucus membranes moist  SKIN: warm, dry. No rashes. Neuro: No focal deficits  Musculoskeletal: Muscle strength 5/5 all ext  Psychiatric: Mood and affect normal    Neck: No JVD, no carotid bruits, no thyromegaly, no lymphadenopathy.  Lungs:Clear bilaterally, no wheezes, rhonci, crackles Cardiovascular: Regular rate and rhythm. No murmurs, gallops or rubs. Abdomen:Soft. Bowel sounds present. Non-tender.  Extremities: No lower extremity edema. Pulses are 2 + in the bilateral DP/PT.  Cardiac cath 09/08/16: Conclusion  Prox RCA lesion, 40 %stenosed.  2nd Mrg lesion, 90 %stenosed.  Ost LPDA to LPDA lesion, 10 %stenosed.  Ost LAD to Mid LAD lesion, 10 %stenosed.  Dist LAD-2 lesion, 99 %stenosed.  A STENT SYNERGY DES 2.25X20 drug eluting stent was successfully placed.  3rd Mrg lesion, 80 %stenosed.  Post intervention, there is a 0% residual stenosis.  Ost 3rd Mrg lesion, 30 %stenosed.  Dist LAD-1 lesion, 99 %stenosed.  Post intervention, there is a 70% residual stenosis.  The left ventricular systolic function is normal.  LV end diastolic pressure is normal.  The left ventricular ejection fraction is 55-65% by visual estimate.  There is no mitral valve regurgitation.   1. Double vessel CAD 2. Patent stents distal Circumflex and proximal LAD 3. Severe stenosis third OM branch  4. Successful PTCA/DES x 1 OM3 5. Severe stenosis in the small distal and apical LAD. Balloon angioplasty with improvement in more proximal lesion in the distal segment but vessel too small for stenting.  6. Normal LV function.  7. Diffuse small branch disease.     Echo 09/09/16: Left ventricle: The cavity size was normal. There was mild focal   basal hypertrophy of the septum. Systolic function was normal.   The estimated ejection fraction was in the range of 60% to 65%.   Wall motion was normal; there were no regional wall motion   abnormalities. Doppler parameters are consistent with abnormal   left ventricular relaxation (grade 1 diastolic dysfunction).   Doppler parameters are consistent with elevated ventricular   end-diastolic filling  pressure. - Aortic valve: Transvalvular velocity was within the normal range.   There was no stenosis. There was no regurgitation. - Mitral valve: There was mild regurgitation. - Left atrium: The atrium was mildly dilated. - Right ventricle: The cavity size was normal. Wall thickness was   normal. Systolic function was normal. - Tricuspid valve: There was mild regurgitation. - Pulmonary arteries: Systolic pressure was mildly increased. PA   peak pressure: 33 mm Hg (S).  EKG:  EKG is ordered today. The ekg ordered today demonstrates Sinus, rate 62 bpm. PVC.   Recent Labs: 10/24/2017: ALT 18; BUN 14; Creat 0.87; Potassium 4.6; Sodium 139   Lipid Panel    Component Value Date/Time   CHOL 159 10/13/2016 0749   TRIG 190 (H) 10/13/2016 0749   HDL 50 (L) 10/13/2016 0749   CHOLHDL 3.2 10/13/2016 0749   VLDL 38 (H) 10/13/2016 0749   LDLCALC 71 10/13/2016 0749     Wt Readings from Last 3 Encounters:  11/27/17 184 lb (83.5 kg)  10/31/17 188 lb (85.3 kg)  08/01/17 182 lb (82.6 kg)     Other studies Reviewed: Additional studies/ records that were reviewed today include: . Review of the above records demonstrates:   Assessment and Plan:   1. CAD without angina: No chest pain. Will continue ASA, statin and beta blocker.    2. HTN: BP is well controlled. No changes  3. Hyperlipidemia: Lipids checked in endocrine clinic. She reports issues with her statin and has had statin intolerance in the past. I have asked her to discuss with her endocrinologist at next visit in several weeks. She may need to consider stopping her statin and starting Repatha or Praluent injections.  Current medicines are reviewed at length with the patient today.  The patient does not have concerns regarding medicines.  The following changes have been made:  no change  Labs/ tests ordered today include:  No orders of  the defined types were placed in this encounter.   Disposition:   FU with me in 12  months  Signed, Verne Carrow, MD 11/27/2017 4:42 PM    [facility] [address] [place], [place], [place]  [address] Phone: [phone]; Fax: [phone]

## 2017-11-28 NOTE — Addendum Note (Signed)
Addended by: Vernard GamblesOLE, Tishawna Larouche S on: 11/28/2017 10:45 AM   Modules accepted: Orders

## 2017-12-16 ENCOUNTER — Other Ambulatory Visit: Payer: Self-pay | Admitting: Cardiovascular Disease

## 2018-01-07 ENCOUNTER — Other Ambulatory Visit: Payer: Self-pay | Admitting: "Endocrinology

## 2018-01-12 ENCOUNTER — Encounter: Payer: Self-pay | Admitting: Cardiovascular Disease

## 2018-01-12 NOTE — Telephone Encounter (Signed)
I spoke with pt and told her Dr. Gibson RampMcAlhany's note indicated she may need to consider stopping her statin and starting Repatha or Praluent injections if she continues to have issues with statins.  Pt would like to try injectable medications.   I scheduled her for appointment in Lipid clinic on August 20,2019 at 9:30

## 2018-01-17 ENCOUNTER — Other Ambulatory Visit: Payer: Self-pay | Admitting: "Endocrinology

## 2018-01-25 ENCOUNTER — Other Ambulatory Visit: Payer: Self-pay

## 2018-01-25 ENCOUNTER — Encounter: Payer: Self-pay | Admitting: Physician Assistant

## 2018-01-25 ENCOUNTER — Ambulatory Visit (INDEPENDENT_AMBULATORY_CARE_PROVIDER_SITE_OTHER): Payer: BLUE CROSS/BLUE SHIELD | Admitting: Physician Assistant

## 2018-01-25 VITALS — BP 130/62 | HR 60 | Temp 98.3°F | Resp 18 | Ht 63.0 in | Wt 182.0 lb

## 2018-01-25 DIAGNOSIS — M353 Polymyalgia rheumatica: Secondary | ICD-10-CM

## 2018-01-25 DIAGNOSIS — I10 Essential (primary) hypertension: Secondary | ICD-10-CM | POA: Diagnosis not present

## 2018-01-25 DIAGNOSIS — W57XXXS Bitten or stung by nonvenomous insect and other nonvenomous arthropods, sequela: Secondary | ICD-10-CM

## 2018-01-25 DIAGNOSIS — Z0000 Encounter for general adult medical examination without abnormal findings: Secondary | ICD-10-CM | POA: Diagnosis not present

## 2018-01-25 DIAGNOSIS — M6289 Other specified disorders of muscle: Secondary | ICD-10-CM

## 2018-01-25 DIAGNOSIS — I2510 Atherosclerotic heart disease of native coronary artery without angina pectoris: Secondary | ICD-10-CM

## 2018-01-25 DIAGNOSIS — M609 Myositis, unspecified: Secondary | ICD-10-CM

## 2018-01-25 DIAGNOSIS — M791 Myalgia, unspecified site: Secondary | ICD-10-CM

## 2018-01-25 DIAGNOSIS — E1159 Type 2 diabetes mellitus with other circulatory complications: Secondary | ICD-10-CM | POA: Diagnosis not present

## 2018-01-25 DIAGNOSIS — E782 Mixed hyperlipidemia: Secondary | ICD-10-CM

## 2018-01-25 DIAGNOSIS — I2583 Coronary atherosclerosis due to lipid rich plaque: Secondary | ICD-10-CM

## 2018-01-25 NOTE — Progress Notes (Addendum)
Patient ID: Cassie HessCynthia S Nasworthy MRN: [id number], DOB: 1956-02-09, 62 y.o. Date of Encounter: @DATE @  Chief Complaint:  Chief Complaint  Patient presents with  . New Patient (Initial Visit)    HPI: 62 y.o. year old female  Presents as a New Patient to Establish Care.    She reports that she has 3 specialists that she sees routinely. She sees Dr. Sanjuana KavaMcAlhaney for Cardiology.  History of CAD, hypertension, hyperlipidemia. She sees Dr. Fransico HimNida for Endocrinology.  He manages her diabetes. She also sees Theatre managerGynecologist once a year.  States that she just does routine GYN visits has no specific GYN problems/history.   States that she had not gone to her prior PCP for over 6 years so when she recently called to schedule visit there they no longer had her records/considered her an active patient. Therefore here to establish PCP. States that she does have one concern that she wanted to address and was the reason for scheduling with PCP.   She reports that she has been having some stiffness and discomfort in her neck and shoulders for about 3 to 4 months.  Also in her hips. States that more recently for the past 3 weeks has also been noticing increased achiness in her elbow and shoulder. States that if she has been sitting for even just 15 minutes, that when she gets up, she has to stand a minute before she can move -- secondary to stiffness etc. States that some days are much worse than others.  States that she had a tick bite in March and developed a bull's-eye rash.  Was prescribed antibiotics and took the antibiotics.  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  [place] so usually is active with her dogs and horses.  This is her usual activity level on a usual daily basis.  She does no formal exercise.  She states that she went to an urgent care because of the symptoms on July 22 and brings in their lab results.  States that those labs all came back normal but she is  continuing these symptoms so came here for further evaluation.   She also has one other request to address. States that Dr. Fransico HimNida wanted her to have her labs drawn here today so that he will have those results available at her visit with him next week.  We also discussed the fact that she had been on Lipitor 80 but she stopped that 3 weeks ago.  Says that she has an appointment with cardiology, the nurse there, and the pharmacist there and that she may try to change to Repatha.  She is off of Lipitor currently secondary to her current symptoms.  No other concerns to address today.    Past Medical History:  Diagnosis Date  . CAD (coronary artery disease)    a. DES to prox LAD and [facility]DA [facility] b. [place] 08/2016 s/p DES to OM3, PTCA to small distal and apical LAD too small for stenting, EF normal, diffuse small branch disease.  . Diabetes mellitus, type II (HCC)   . Hyperlipidemia   . Hypertension   . Obesity      Home Meds: Outpatient Medications Prior to Visit  Medication Sig Dispense Refill  . acetaminophen (TYLENOL) 500 MG tablet Take 500-1,000 mg by mouth every 6 (six) hours as needed (for pain).    Marland Kitchen. aspirin EC 81 MG tablet Take 81 mg by mouth daily.    .Marland Kitchen  Blood Glucose Monitoring Suppl (FREESTYLE LITE) DEVI 1 to test glucose 4 times a day 1 each prn  . co-enzyme Q-10 30 MG capsule Take 30 mg by mouth daily.    . Cranberry-Vitamin C-Vitamin E 4200-20-3 MG-MG-UNIT CAPS Take 1 capsule by mouth daily.    . insulin aspart (NOVOLOG FLEXPEN) 100 UNIT/ML FlexPen Inject 15-21 Units into the skin 3 (three) times daily with meals. 60 mL 0  . insulin lispro (HUMALOG) 100 UNIT/ML injection Inject 60 Units into the skin daily.    . INSULIN SYRINGE .5CC/29G 29G X 1/2" 0.5 ML MISC Use 3 times to inject insulin 100 each 2  . isosorbide mononitrate (IMDUR) 30 MG 24 hr tablet TAKE 1 TABLET(30 MG TOTAL) BY MOUTH DAILY 90 tablet 3  . Lancets (FREESTYLE) lancets Use as instructed 150  each 3  . lisinopril (PRINIVIL,ZESTRIL) 20 MG tablet Take 1 tablet (20 mg total) by mouth daily. 90 tablet 2  . metFORMIN (GLUCOPHAGE-XR) 500 MG 24 hr tablet TAKE 2 TABLETS BY MOUTH DAILY WITH BREAKFAST 180 tablet 0  . metoprolol succinate (TOPROL-XL) 50 MG 24 hr tablet TAKE 1 TABLET BY MOUTH EVERY MORNING. PLEASE SCHEDULE AN APPOINTMENT FOR FUTURE REFILLS 90 tablet 3  . nitroGLYCERIN (NITROSTAT) 0.4 MG SL tablet Place 0.4 mg under the tongue every 5 (five) minutes as needed for chest pain.    Nathen May SOLOSTAR 300 UNIT/ML SOPN INJECT 70 UNITS UNDER THE SKIN AT BEDTIME 9 mL 2  . venlafaxine XR (EFFEXOR-XR) 75 MG 24 hr capsule Take 75 mg by mouth daily with breakfast.    . atorvastatin (LIPITOR) 80 MG tablet Take 1 tablet (80 mg total) by mouth daily at 6 PM. (Patient not taking: Reported on 01/25/2018) 90 tablet 3   No facility-administered medications prior to visit.     Allergies: No Known Allergies  Social History   Socioeconomic History  . Marital status: Married    Spouse name: Not on file  . Number of children: Not on file  . Years of education: Not on file  . Highest education level: Not on file  Occupational History  . Not on file  Social Needs  . Financial resource strain: Not on file  . Food insecurity:    Worry: Not on file    Inability: Not on file  . Transportation needs:    Medical: Not on file    Non-medical: Not on file  Tobacco Use  . Smoking status: Never Smoker  . Smokeless tobacco: Never Used  Substance and Sexual Activity  . Alcohol use: No    Alcohol/week: 0.0 standard drinks  . Drug use: No  . Sexual activity: Not on file  Lifestyle  . Physical activity:    Days per week: Not on file    Minutes per session: Not on file  . Stress: Not on file  Relationships  . Social connections:    Talks on phone: Not on file    Gets together: Not on file    Attends religious service: Not on file    Active member of club or organization: Not on file    Attends  meetings of clubs or organizations: Not on file    Relationship status: Not on file  . Intimate partner violence:    Fear of current or ex partner: Not on file    Emotionally abused: Not on file    Physically abused: Not on file    Forced sexual activity: Not on file  Other Topics Concern  .  Not on file  Social History Narrative  . Not on file    Family History  Problem Relation Age of Onset  . Heart attack Other   . Diabetes Other      Review of Systems:  See HPI for pertinent ROS. All other ROS negative.    Physical Exam: Blood pressure 130/62, pulse 60, temperature 98.3 F (36.8 C), temperature source Oral, resp. rate 18, height 5\' 3"  (1.6 m), weight 82.6 kg, SpO2 96 %., Body mass index is 32.24 kg/m. General: WF. Appears in no acute distress. Neck: Supple. No thyromegaly. No lymphadenopathy. Lungs: Clear bilaterally to auscultation without wheezes, rales, or rhonchi. Breathing is unlabored. Heart: RRR with S1 S2. No murmurs, rubs, or gallops. Musculoskeletal:  Strength and tone normal for age.  Inspection of joints appears normal.  There is no erythema or swelling on inspection. Extremities/Skin: Warm and dry. No rashes.  Neuro: Alert and oriented X 3. Moves all extremities spontaneously. Gait is normal. CNII-XII grossly in tact. Psych:  Responds to questions appropriately with a normal affect.    She brought in copy of lab results performed at urgent care 01/08/2018. They performed the following tests.  All of these test results were normal. Urinalysis CMET TSH CBC ANA C reactive protein CK Lyme titer RF  ASSESSMENT AND PLAN:  62 y.o. year old female with   1. Encounter for medical examination to establish care  2. Type 2 diabetes mellitus with vascular disease (HCC) Diabetes is managed by Dr. Fransico Him.  She has lab order for the following labs to be drawn here by Dr. Fransico Him I will follow-up these results/manage. - COMPLETE METABOLIC PANEL WITH GFR - Hemoglobin  A1c - Microalbumin, urine  3. Myositis of multiple sites, unspecified myositis type The only additional labs I can think of to obtain or a sed rate and [facility] spotted fever titer.  Will obtain these labs and follow-up results.  Will refer to rheumatology for further evaluation and management. - Sedimentation rate - Rocky mtn spotted fvr abs pnl(IgG+IgM) - Ambulatory referral to Rheumatology Addendum added 03/28/2018----received note from Rheumatology.  Diagnosed with PMR--- Polymyalgia rheumatica.  --Being treated with prednisone.  4. Muscle pain The only additional labs I can think of to obtain or a sed rate and [facility] spotted fever titer.  Will obtain these labs and follow-up results.  Will refer to rheumatology for further evaluation and management. - Sedimentation rate - Rocky mtn spotted fvr abs pnl(IgG+IgM) - Ambulatory referral to Rheumatology Addendum added 03/28/2018----received note from Rheumatology.  Diagnosed with PMR--- Polymyalgia rheumatica.  --Being treated with prednisone.  5. Muscle tightness The only additional labs I can think of to obtain or a sed rate and [facility] spotted fever titer.  Will obtain these labs and follow-up results.  Will refer to rheumatology for further evaluation and management. - Sedimentation rate - Rocky mtn spotted fvr abs pnl(IgG+IgM) - Ambulatory referral to Rheumatology Addendum added 03/28/2018----received note from Rheumatology.  Diagnosed with PMR--- Polymyalgia rheumatica.  --Being treated with prednisone.  6. Tick bite, sequela The only additional labs I can think of to obtain or a sed rate and [facility] spotted fever titer.  Will obtain these labs and follow-up results.  Will refer to rheumatology for further evaluation and management. - Sedimentation rate - Rocky mtn spotted fvr abs pnl(IgG+IgM) - Ambulatory referral to Rheumatology  7. Essential hypertension, benign Blood pressure is at goal/controlled.  This is  managed by cardiology.  Continue current treatment.  8. Coronary artery disease due to lipid rich plaque Managed by cardiology.  Stable.  Continue current treatment.  9. Mixed hyperlipidemia This is managed by cardiology.  Statin is currently on hold but she does have follow-up at cardiology for further management.    Discussed scheduling CPE to update preventive care sometime in the future once her current symptoms are managed.  Otherwise follow-up PRN.   Signed, [address] [place], [place], [facility] 01/25/2018 11:11 AM

## 2018-01-26 LAB — MICROALBUMIN, URINE: MICROALB UR: 1 mg/dL

## 2018-01-26 LAB — COMPLETE METABOLIC PANEL WITH GFR
AG Ratio: 1.7 (calc) (ref 1.0–2.5)
ALBUMIN MSPROF: 4.3 g/dL (ref 3.6–5.1)
ALT: 23 U/L (ref 6–29)
AST: 16 U/L (ref 10–35)
Alkaline phosphatase (APISO): 88 U/L (ref 33–130)
BUN: 14 mg/dL (ref 7–25)
CO2: 28 mmol/L (ref 20–32)
CREATININE: 0.84 mg/dL (ref 0.50–0.99)
Calcium: 9.8 mg/dL (ref 8.6–10.4)
Chloride: 105 mmol/L (ref 98–110)
GFR, EST AFRICAN AMERICAN: 88 mL/min/{1.73_m2} (ref >=60)
GFR, Est Non African American: 76 mL/min/{1.73_m2} (ref >=60)
GLUCOSE: 104 mg/dL — AB (ref 65–99)
Globulin: 2.6 g/dL (calc) (ref 1.9–3.7)
Potassium: 5.3 mmol/L (ref 3.5–5.3)
Sodium: 143 mmol/L (ref 135–146)
TOTAL PROTEIN: 6.9 g/dL (ref 6.1–8.1)
Total Bilirubin: 0.6 mg/dL (ref 0.2–1.2)

## 2018-01-26 LAB — HEMOGLOBIN A1C
EAG (MMOL/L): 10.8 (calc)
HEMOGLOBIN A1C: 8.4 %{Hb} — AB (ref <5.7)
Mean Plasma Glucose: 194 (calc)

## 2018-01-26 LAB — ROCKY MTN SPOTTED FVR ABS PNL(IGG+IGM)
RMSF IGG: NOT DETECTED
RMSF IgM: NOT DETECTED

## 2018-01-26 LAB — SEDIMENTATION RATE: Sed Rate: 34 mm/h — ABNORMAL HIGH (ref 0–30)

## 2018-01-31 ENCOUNTER — Encounter: Payer: Self-pay | Admitting: "Endocrinology

## 2018-01-31 ENCOUNTER — Ambulatory Visit (INDEPENDENT_AMBULATORY_CARE_PROVIDER_SITE_OTHER): Payer: BLUE CROSS/BLUE SHIELD | Admitting: "Endocrinology

## 2018-01-31 VITALS — BP 143/80 | HR 72 | Ht 61.0 in | Wt 185.0 lb

## 2018-01-31 DIAGNOSIS — E1159 Type 2 diabetes mellitus with other circulatory complications: Secondary | ICD-10-CM | POA: Diagnosis not present

## 2018-01-31 DIAGNOSIS — E782 Mixed hyperlipidemia: Secondary | ICD-10-CM | POA: Diagnosis not present

## 2018-01-31 DIAGNOSIS — I10 Essential (primary) hypertension: Secondary | ICD-10-CM | POA: Diagnosis not present

## 2018-01-31 MED ORDER — INSULIN GLARGINE 300 UNIT/ML ~~LOC~~ SOPN: 70.0000 [IU] | PEN_INJECTOR | Freq: Every day | SUBCUTANEOUS | 2 refills | Status: DC

## 2018-01-31 NOTE — Progress Notes (Signed)
Endocrinology follow-up note  Subjective:    Patient ID: Cassie Cordova, female    DOB: October 02, 1955,    Past Medical History:  Diagnosis Date  . CAD (coronary artery disease)    a. DES to prox LAD and staged [facility] to [facility]DA [facility]. b. [place] s/p DES to OM3, PTCA to small distal and apical LAD too small for stenting, EF normal, diffuse small branch disease.  . Diabetes mellitus, type II (HCC)   . Hyperlipidemia   . Hypertension   . Obesity    Past Surgical History:  Procedure Laterality Date  . ABDOMINAL HYSTERECTOMY    . CESAREAN SECTION    . CHOLECYSTECTOMY    . CORONARY STENT INTERVENTION N/A 09/09/2016   Procedure: Coronary Stent Intervention;  Surgeon: Kathleene Hazelhristopher D McAlhany, MD;  [facility];  Service: Cardiovascular;  Laterality: N/A;  OM3 DES 2.25x 20, POBA Distal LAD  . LEFT HEART CATH AND CORONARY ANGIOGRAPHY N/A 09/09/2016   Procedure: Left Heart Cath and Coronary Angiography;  Surgeon: Kathleene Hazelhristopher D McAlhany, MD;  [facility];  Service: Cardiovascular;  Laterality: N/A;   Social History   Socioeconomic History  . Marital status: Married    Spouse name: Not on file  . Number of children: Not on file  . Years of education: Not on file  . Highest education level: Not on file  Occupational History  . Not on file  Social Needs  . Financial resource strain: Not on file  . Food insecurity:    Worry: Not on file    Inability: Not on file  . Transportation needs:    Medical: Not on file    Non-medical: Not on file  Tobacco Use  . Smoking status: Never Smoker  . Smokeless tobacco: Never Used  Substance and Sexual Activity  . Alcohol use: No    Alcohol/week: 0.0 standard drinks  . Drug use: No  . Sexual activity: Not on file  Lifestyle  . Physical activity:    Days per week: Not on file    Minutes per session: Not on file  . Stress: Not on file  Relationships  . Social connections:    Talks on phone: Not on file     Gets together: Not on file    Attends religious service: Not on file    Active member of club or organization: Not on file    Attends meetings of clubs or organizations: Not on file    Relationship status: Not on file  Other Topics Concern  . Not on file  Social History Narrative  . Not on file   Outpatient Encounter Medications as of 01/31/2018  Medication Sig  . acetaminophen (TYLENOL) 500 MG tablet Take 500-1,000 mg by mouth every 6 (six) hours as needed (for pain).  Marland Kitchen. aspirin EC 81 MG tablet Take 81 mg by mouth daily.  Marland Kitchen. co-enzyme Q-10 30 MG capsule Take 30 mg by mouth daily.  . Cranberry-Vitamin C-Vitamin E 4200-20-3 MG-MG-UNIT CAPS Take 1 capsule by mouth daily.  . insulin aspart (NOVOLOG FLEXPEN) 100 UNIT/ML FlexPen Inject 15-21 Units into the skin 3 (three) times daily with meals.  . Insulin Glargine (TOUJEO SOLOSTAR) 300 UNIT/ML SOPN Inject 70 Units into the skin at bedtime.  . INSULIN SYRINGE .5CC/29G 29G X 1/2" 0.5 ML MISC Use 3 times to inject insulin  . isosorbide mononitrate (IMDUR) 30 MG 24 hr tablet TAKE 1 TABLET(30 MG TOTAL) BY MOUTH DAILY  . Lancets (  FREESTYLE) lancets Use as instructed  . lisinopril (PRINIVIL,ZESTRIL) 20 MG tablet Take 1 tablet (20 mg total) by mouth daily.  . metFORMIN (GLUCOPHAGE-XR) 500 MG 24 hr tablet TAKE 2 TABLETS BY MOUTH DAILY WITH BREAKFAST  . metoprolol succinate (TOPROL-XL) 50 MG 24 hr tablet TAKE 1 TABLET BY MOUTH EVERY MORNING. PLEASE SCHEDULE AN APPOINTMENT FOR FUTURE REFILLS  . nitroGLYCERIN (NITROSTAT) 0.4 MG SL tablet Place 0.4 mg under the tongue every 5 (five) minutes as needed for chest pain.  Marland Kitchen venlafaxine XR (EFFEXOR-XR) 75 MG 24 hr capsule Take 75 mg by mouth daily with breakfast.  . [DISCONTINUED] atorvastatin (LIPITOR) 80 MG tablet Take 1 tablet (80 mg total) by mouth daily at 6 PM. (Patient not taking: Reported on 01/25/2018)  . [DISCONTINUED] Blood Glucose Monitoring Suppl (FREESTYLE LITE) DEVI 1 to test glucose 4 times a day  .  [DISCONTINUED] insulin lispro (HUMALOG) 100 UNIT/ML injection Inject 60 Units into the skin daily.  . [DISCONTINUED] TOUJEO SOLOSTAR 300 UNIT/ML SOPN INJECT 70 UNITS UNDER THE SKIN AT BEDTIME   No facility-administered encounter medications on file as of 01/31/2018.    ALLERGIES: No Known Allergies VACCINATION STATUS:  There is no immunization history on file for this patient.  Diabetes  She presents for her follow-up diabetic visit. She has type 2 diabetes mellitus. Onset time: She was diagnosed at approximate age of 47 years. Her disease course has been improving. There are no hypoglycemic associated symptoms. Pertinent negatives for hypoglycemia include no confusion, headaches, pallor or seizures. Associated symptoms include polydipsia and polyuria. Pertinent negatives for diabetes include no chest pain, no fatigue and no polyphagia. There are no hypoglycemic complications. Symptoms are improving. Diabetic complications include heart disease. Risk factors for coronary artery disease include diabetes mellitus, dyslipidemia, hypertension, sedentary lifestyle and obesity. Current diabetic treatment includes insulin injections and oral agent (monotherapy). She is compliant with treatment some of the time (She admits inconsistency in taking her invokana and Trulicity. She also missed some doses of Toujeo as well.). Her weight is fluctuating minimally. She is following a generally unhealthy diet. When asked about meal planning, she reported none. She has had a previous visit with a dietitian. She rarely participates in exercise. Blood glucose monitoring compliance is inadequate. Her home blood glucose trend is decreasing steadily. Her breakfast blood glucose range is generally 180-200 mg/dl. Her lunch blood glucose range is generally 180-200 mg/dl. Her dinner blood glucose range is generally 180-200 mg/dl. Her bedtime blood glucose range is generally 180-200 mg/dl. Her overall blood glucose range is 180-200  mg/dl. An ACE inhibitor/angiotensin II receptor blocker is being taken. Eye exam is current.  Hypertension  This is a chronic problem. The problem is controlled. Pertinent negatives include no chest pain, headaches, palpitations or shortness of breath. Risk factors for coronary artery disease include dyslipidemia, diabetes mellitus, sedentary lifestyle, obesity and post-menopausal state. Past treatments include ACE inhibitors. The current treatment provides no improvement.  Hyperlipidemia  This is a chronic problem. The current episode started more than 1 year ago. The problem is controlled. Recent lipid tests were reviewed and are normal. Exacerbating diseases include diabetes and obesity. Pertinent negatives include no chest pain, myalgias or shortness of breath. Current antihyperlipidemic treatment includes statins. Risk factors for coronary artery disease include dyslipidemia, diabetes mellitus, hypertension, obesity, a sedentary lifestyle and post-menopausal.    Review of Systems  Constitutional: Negative for fatigue and unexpected weight change.  HENT: Negative for trouble swallowing and voice change.   Eyes: Negative for visual  disturbance.  Respiratory: Negative for cough, shortness of breath and wheezing.   Cardiovascular: Negative for chest pain, palpitations and leg swelling.  Gastrointestinal: Negative for diarrhea, nausea and vomiting.  Endocrine: Positive for polydipsia and polyuria. Negative for cold intolerance, heat intolerance and polyphagia.  Musculoskeletal: Negative for arthralgias and myalgias.  Skin: Negative for color change, pallor, rash and wound.  Neurological: Negative for seizures and headaches.  Psychiatric/Behavioral: Negative for confusion and suicidal ideas.    Objective:    BP (!) 143/80   Pulse 72   Wt 185 lb (83.9 kg)   BMI 32.77 kg/m   Wt Readings from Last 3 Encounters:  01/31/18 185 lb (83.9 kg)  01/25/18 182 lb (82.6 kg)  11/27/17 184 lb (83.5  kg)    Physical Exam  Constitutional: She is oriented to person, place, and time. She appears well-developed.  HENT:  Head: Normocephalic and atraumatic.  Eyes: EOM are normal.  Neck: Normal range of motion. Neck supple. No tracheal deviation present. No thyromegaly present.  Cardiovascular: Normal rate.  Pulmonary/Chest: Effort normal.  Abdominal: There is no tenderness. There is no guarding.  Musculoskeletal: Normal range of motion. She exhibits no edema.  Neurological: She is alert and oriented to person, place, and time. No cranial nerve deficit. Coordination normal.  Skin: Skin is warm and dry. No rash noted. No erythema. No pallor.  Psychiatric: She has a normal mood and affect. Judgment normal.    Results for orders placed or performed in visit on 01/25/18  COMPLETE METABOLIC PANEL WITH GFR  Result Value Ref Range   Glucose, Bld 104 (H) 65 - 99 mg/dL   BUN 14 7 - 25 mg/dL   Creat 1.61 0.96 - 0.45 mg/dL   GFR, Est Non African American 76 > OR = 60 mL/min/1.63m2   GFR, Est African American 88 > OR = 60 mL/min/1.57m2   BUN/Creatinine Ratio NOT APPLICABLE 6 - 22 (calc)   Sodium 143 135 - 146 mmol/L   Potassium 5.3 3.5 - 5.3 mmol/L   Chloride 105 98 - 110 mmol/L   CO2 28 20 - 32 mmol/L   Calcium 9.8 8.6 - 10.4 mg/dL   Total Protein 6.9 6.1 - 8.1 g/dL   Albumin 4.3 3.6 - 5.1 g/dL   Globulin 2.6 1.9 - 3.7 g/dL (calc)   AG Ratio 1.7 1.0 - 2.5 (calc)   Total Bilirubin 0.6 0.2 - 1.2 mg/dL   Alkaline phosphatase (APISO) 88 33 - 130 U/L   AST 16 10 - 35 U/L   ALT 23 6 - 29 U/L  Hemoglobin A1c  Result Value Ref Range   Hgb A1c MFr Bld 8.4 (H) <5.7 % of total Hgb   Mean Plasma Glucose 194 (calc)   eAG (mmol/L) 10.8 (calc)  Microalbumin, urine  Result Value Ref Range   Microalb, Ur 1.0 mg/dL   RAM    Sedimentation rate  Result Value Ref Range   Sed Rate 34 (H) 0 - 30 mm/h  Rocky mtn spotted fvr abs pnl(IgG+IgM)  Result Value Ref Range   RMSF IgG NOT DETECTED NOT DETECT    RMSF IgM NOT DETECTED NOT DETECT   Complete Blood Count (Most recent): Lab Results  Component Value Date   WBC 5.0 09/10/2016   HGB 13.4 09/10/2016   HCT 39.9 09/10/2016   MCV 93.4 09/10/2016   PLT 194 09/10/2016   Diabetic Labs (most recent): Lab Results  Component Value Date   HGBA1C 8.4 (H) 01/25/2018   HGBA1C  8.8 (H) 10/24/2017   HGBA1C 9.6 (H) 07/27/2017   Lipid Panel     Component Value Date/Time   CHOL 159 10/13/2016 0749   TRIG 190 (H) 10/13/2016 0749   HDL 50 (L) 10/13/2016 0749   CHOLHDL 3.2 10/13/2016 0749   VLDL 38 (H) 10/13/2016 0749   LDLCALC 71 10/13/2016 0749    Assessment & Plan:   1. Type 2 diabetes mellitus with vascular disease (HCC)  -Her diabetes is  complicated by coronary artery disease and patient remains at a high risk for more acute and chronic complications of diabetes which include CAD, CVA, CKD, retinopathy, and neuropathy. These are all discussed in detail with the patient.  She came with continued slight improvement in her A1c to 8.4%, generally improving from 9.6%.    -She has not used her CGM device since her last visit, due to some skin reaction. -Her insulin administration activities are on and off, and follows random meal/injection timing.   - I have re-counseled the patient on diet management and weight loss  by adopting a carbohydrate restricted / protein rich  Diet.  -  Suggestion is made for her to avoid simple carbohydrates  from her diet including Cakes, Sweet Desserts / Pastries, Ice Cream, Soda (diet and regular), Sweet Tea, Candies, Chips, Cookies, Store Bought Juices, Alcohol in Excess of  1-2 drinks a day, Artificial Sweeteners, and "Sugar-free" Products. This will help patient to have stable blood glucose profile and potentially avoid unintended weight gain.   - Patient is advised to stick to a routine mealtimes to eat 3 meals  a day and avoid unnecessary snacks ( to snack only to correct hypoglycemia).  - I have  approached patient with the following individualized plan to manage diabetes and patient agrees.  -  She is reapproached for better engagement.  She has no documented or reported hypoglycemia. -I approached her to increase her Toujeo to 70 units nightly, continue NovoLog  15 units 3 times a day before meals  for pre-meal blood glucose above 90 mg/dL plus patient specific correction dose. - she is advised to continue strict documentation of blood glucose 4 times a day-before meals and at bedtime.  -Patient is encouraged to call clinic for blood glucose levels less than 70 or above 200 mg /dl.  - She is tolerating metformin better. I advised her to  1000 mg extended release once a day after breakfast.    - Patient specific target  for A1c; LDL, HDL, Triglycerides, and  Waist Circumference were discussed in detail.  2) BP/HTN: Her blood pressure is controlled to target.  She is advised to continue her blood pressure medications including lisinopril 20 mg p.o. daily.    3) Lipids/HPL: Recent labs showed controlled LDL at 71.  She reports intolerance to atorvastatin currently at 80 mg p.o. nightly.  She would not consider a lower dose of atorvastatin which she may actually tolerate.  She is being evaluated by her cardiologist for utility of Repatha treatment.  She will need repeat fasting lipid panel before her next visit.     4)  Weight/Diet: CDE consult in progress, exercise, and carbohydrates information provided.  5) Chronic Care/Health Maintenance:  -Patient  on ACEI/ARB and Statin medications and encouraged to continue to follow up with Ophthalmology, Cardiology,  Podiatrist at least yearly or according to recommendations, and advised to  stay away from smoking. I have recommended yearly flu vaccine and pneumonia vaccination at least every 5 years; and  sleep  for at least 7 hours a day.  - Time spent with the patient: 25 min, of which >50% was spent in reviewing her blood glucose logs ,  discussing her hypo- and hyper-glycemic episodes, reviewing her current and  previous labs and insulin doses and developing a plan to avoid hypo- and hyper-glycemia. Please refer to Patient Instructions for Blood Glucose Monitoring and Insulin/Medications Dosing Guide"  in media tab for additional information. Cassie Cordova participated in the discussions, expressed understanding, and voiced agreement with the above plans.  All questions were answered to her satisfaction. she is encouraged to contact clinic should she have any questions or concerns prior to her return visit.   Follow up plan: Return in about 3 months (around 05/03/2018) for Follow up with Pre-visit Labs, Meter, and Logs.  Marquis Lunch, MD Phone: [phone]  Fax: [phone]   -  This note was partially dictated with voice recognition software. Similar sounding words can be transcribed inadequately or may not  be corrected upon review.  01/31/2018, 4:52 PM

## 2018-01-31 NOTE — Patient Instructions (Signed)

## 2018-02-05 ENCOUNTER — Ambulatory Visit: Payer: BLUE CROSS/BLUE SHIELD | Admitting: Physician Assistant

## 2018-02-06 ENCOUNTER — Ambulatory Visit (INDEPENDENT_AMBULATORY_CARE_PROVIDER_SITE_OTHER): Payer: BLUE CROSS/BLUE SHIELD | Admitting: Pharmacist

## 2018-02-06 DIAGNOSIS — E7800 Pure hypercholesterolemia, unspecified: Secondary | ICD-10-CM | POA: Diagnosis not present

## 2018-02-06 LAB — LDL CHOLESTEROL, DIRECT: LDL DIRECT: 134 mg/dL — AB (ref 0–99)

## 2018-02-06 MED ORDER — NITROGLYCERIN 0.4 MG SL SUBL: 0.4000 mg | SUBLINGUAL_TABLET | SUBLINGUAL | 4 refills | Status: DC | PRN

## 2018-02-06 NOTE — Progress Notes (Signed)
Patient ID: Cassie HessCynthia S Dudek                 DOB: 1956-02-23                    MRN: [id number]     HPI: Cassie Cordova is a 62 y.o. female patient referred to lipid clinic by Dr. Clifton JamesMcAlhany. PMH is significant for CAD, DM, HTN, HLD. History of MI (2012) s/p DES in LAD and staged PCI in left PDA. [facility] and underwent cardiac cath which showed patent stents in proximal LAD and circumflex. At last visit in clinic 11/27/17, she noted muscle aches on statins. Most recently, she stopped her atorvastatin 80 mg in July 2019 due to muscle aches.   Patient presents to clinic for cholesterol management. She has been off of her statin for over a month now. She was switched to atorvastatin 80 mg from atorvastatin 20 mg in 2018. She has tried atorvastatin with coQ10 and it helped but she still experienced pain. She has tried other statins in the past and contacted her pharmacy to retrieve those records showing she has tried pravastatin 40mg  daily and rosuvastatin 10mg  daily. She is not willing to try another statin due to them causing her extreme pain. Patient has noted that she has had increasing pain and has been taking naproxen every other day. She is going to schedule an appointment with a rheumatologist. Discussed risks of NSAIDs with her CVD history and ACEI and suggested that she try acetaminophen. She also noted that her nitroglycerin was expired and needed a refill.  Current Medications: none Intolerances: atorvastatin 20 mg & 80 mg- muscle aches, pravastatin 40 mg- myalgias, rosuvastatin 10 mg- muscle pain Risk Factors: CAD, DM, HTN, HLD, hx of MI s/p DES in LAD LDL goal: <70 mg/dL  Diet: mostly eats at home. Eats mostly chicken, occasionally will eat pork and beef. Likes to eat vegetables more than meat. Tries to keep track of carbs so she can titrate her insulin. Drinks water and coffee. Drinks 2 cups of coffee with creamer and Splenda every morning.   Exercise: goes up an  down stairs, lives over a barn. Lately has not been exercising due to heat. [place]. Has treadmill at home as well.   Family History: grandmother- diabetes, grandfather- died of heart attack at young age, cousin- 1st heart attack in his 10640s  Social History: never smoker, denies alcohol or other drug use  Labs: 10/13/16: TC 159, TG 190, HDL 50, LDL 71 (on atorvastatin 80 mg)  Past Medical History:  Diagnosis Date  . CAD (coronary artery disease)    a. DES to prox LAD and [facility]DA [facility]. b. [place] 08[place] s/p DES to OM3, PTCA to small distal and apical LAD too small for stenting, EF normal, diffuse small branch disease.  . Diabetes mellitus, type II (HCC)   . Hyperlipidemia   . Hypertension   . Obesity     Current Outpatient Medications on File Prior to Visit  Medication Sig Dispense Refill  . acetaminophen (TYLENOL) 500 MG tablet Take 500-1,000 mg by mouth every 6 (six) hours as needed (for pain).    Marland Kitchen. aspirin EC 81 MG tablet Take 81 mg by mouth daily.    Marland Kitchen. co-enzyme Q-10 30 MG capsule Take 30 mg by mouth daily.    . Cranberry-Vitamin C-Vitamin E 4200-20-3 MG-MG-UNIT CAPS Take 1  capsule by mouth daily.    . insulin aspart (NOVOLOG FLEXPEN) 100 UNIT/ML FlexPen Inject 15-21 Units into the skin 3 (three) times daily with meals. 60 mL 0  . Insulin Glargine (TOUJEO SOLOSTAR) 300 UNIT/ML SOPN Inject 70 Units into the skin at bedtime. 9 mL 2  . INSULIN SYRINGE .5CC/29G 29G X 1/2" 0.5 ML MISC Use 3 times to inject insulin 100 each 2  . isosorbide mononitrate (IMDUR) 30 MG 24 hr tablet TAKE 1 TABLET(30 MG TOTAL) BY MOUTH DAILY 90 tablet 3  . Lancets (FREESTYLE) lancets Use as instructed 150 each 3  . lisinopril (PRINIVIL,ZESTRIL) 20 MG tablet Take 1 tablet (20 mg total) by mouth daily. 90 tablet 2  . metFORMIN (GLUCOPHAGE-XR) 500 MG 24 hr tablet TAKE 2 TABLETS BY MOUTH DAILY WITH BREAKFAST 180 tablet 0  . metoprolol succinate (TOPROL-XL) 50 MG 24 hr tablet  TAKE 1 TABLET BY MOUTH EVERY MORNING. PLEASE SCHEDULE AN APPOINTMENT FOR FUTURE REFILLS 90 tablet 3  . nitroGLYCERIN (NITROSTAT) 0.4 MG SL tablet Place 0.4 mg under the tongue every 5 (five) minutes as needed for chest pain.    Marland Kitchen. venlafaxine XR (EFFEXOR-XR) 75 MG 24 hr capsule Take 75 mg by mouth daily with breakfast.     No current facility-administered medications on file prior to visit.     No Known Allergies  Assessment/Plan:  1. Hyperlipidemia - LDL currently uncontrolled (<70 mg/dL). Discussed retrying statin with patient, however she is unwilling to try another statin due to previous myalgias. Discussed starting PCSK9i with patient which she is agreeable to. Discussed injection technique, dosing, and side effects with patient. Will submit PA to patient's insurance and provided patient with Praluent co-pay card. Patient has not had lipid panel since April 2018, which was on atorvastatin 80 mg, however patient has since d/c statin therapy. Obtained direct LDL in lab today. If PA approved, patient is agreeable to receiving PCSK9i [facility] in [place] and having the medication shipped to her home. Will follow-up with patient after PA returns to discuss next steps.  Patient seen with Thomes CakeWendy Sun, PharmD Candidate  Megan E. Supple, PharmD, BCACP, CPP [facility] [address], [place], [place] [address] Phone: [phone]) [phone]; Fax: [phone]) [phone] 02/06/2018 11:03 AM

## 2018-02-06 NOTE — Patient Instructions (Addendum)
It was great seeing you today!  Today we discussed starting a PCSK9i either Repatha or Praluent. We will submit the documentation to your insurance company and contact you when we hear back. If you have any questions, please give the clinic a call at [phone].

## 2018-02-09 ENCOUNTER — Telehealth: Payer: Self-pay | Admitting: Student-PharmD

## 2018-02-09 NOTE — Telephone Encounter (Signed)
PA sent for Repatha. Formulary prefers Repatha over Praluent.

## 2018-02-13 ENCOUNTER — Other Ambulatory Visit: Payer: Self-pay | Admitting: "Endocrinology

## 2018-02-13 MED ORDER — EVOLOCUMAB 140 MG/ML ~~LOC~~ SOAJ: 1.0000 "pen " | SUBCUTANEOUS | 11 refills | Status: DC

## 2018-02-13 NOTE — Addendum Note (Signed)
Addended by: SUPPLE, MEGAN E on: 02/13/2018 11:56 AM   Modules accepted: Orders

## 2018-02-13 NOTE — Telephone Encounter (Signed)
Prior authorization for Repatha has been approved. Rx sent to specialty pharmacy and pt is aware. She is comfortable with injections at home and will call clinic once she receives her shipment so that we can schedule follow up lab work.

## 2018-03-28 DIAGNOSIS — M353 Polymyalgia rheumatica: Secondary | ICD-10-CM | POA: Insufficient documentation

## 2018-04-02 ENCOUNTER — Other Ambulatory Visit: Payer: Self-pay | Admitting: "Endocrinology

## 2018-04-05 NOTE — Telephone Encounter (Signed)
Pt has started on injections without issue and sees her rheumatologist next week - reports they can check her cholesterol.

## 2018-05-07 ENCOUNTER — Telehealth: Payer: Self-pay | Admitting: "Endocrinology

## 2018-05-07 MED ORDER — PEN NEEDLES 32G X 4 MM MISC: 1.0000 | Freq: Four times a day (QID) | 2 refills | Status: AC

## 2018-05-07 NOTE — Telephone Encounter (Signed)
Aram BeechamCynthia is asking for a refill on the Pen Needles for the ends of her Novolog and Toujeo sent to [facility] for  3 mon supply, please advise?

## 2018-05-09 ENCOUNTER — Ambulatory Visit: Payer: BLUE CROSS/BLUE SHIELD | Admitting: "Endocrinology

## 2018-05-10 ENCOUNTER — Telehealth: Payer: Self-pay

## 2018-05-10 DIAGNOSIS — E782 Mixed hyperlipidemia: Secondary | ICD-10-CM

## 2018-05-10 DIAGNOSIS — I2510 Atherosclerotic heart disease of native coronary artery without angina pectoris: Secondary | ICD-10-CM

## 2018-05-10 NOTE — Telephone Encounter (Signed)
Pt returned call to clinic - states she is having labs draw [place] 3rd [place] and would like us to add on a cholesterol panel. Will fax over lipid order per pt request and she will bring this in with her to her lab appt.   Pt is also frustrated that her insurance is making her switch to [facility] year and she will be no longer be able to see Dr Clifton JamesMcAlhany since he will not be in network.  Will route a message to Dr Clifton JamesMcAlhany - she would like a recommendation for a new cardiologist to [facility] at [facility] as well as a referral.

## 2018-05-10 NOTE — Telephone Encounter (Signed)
I spoke with pt and gave her information from Dr. Clifton JamesMcAlhany.  Will send message to her through my chart with this information.

## 2018-05-10 NOTE — Telephone Encounter (Signed)
Dennie BiblePat, I would recommend Hoy FinlayJoe Rossi or Elyn PeersJohn Vavalle (based on recommendations from [facility] who trained [facility]). If needed, we can make a referral for her and send records over. Thanks, chris

## 2018-05-23 LAB — COMPLETE METABOLIC PANEL WITH GFR
AG Ratio: 1.9 (calc) (ref 1.0–2.5)
ALBUMIN MSPROF: 4.4 g/dL (ref 3.6–5.1)
ALKALINE PHOSPHATASE (APISO): 60 U/L (ref 33–130)
ALT: 24 U/L (ref 6–29)
AST: 12 U/L (ref 10–35)
BUN: 23 mg/dL (ref 7–25)
CO2: 30 mmol/L (ref 20–32)
CREATININE: 0.84 mg/dL (ref 0.50–0.99)
Calcium: 9.5 mg/dL (ref 8.6–10.4)
Chloride: 104 mmol/L (ref 98–110)
GFR, EST NON AFRICAN AMERICAN: 76 mL/min/{1.73_m2} (ref >=60)
GFR, Est African American: 88 mL/min/{1.73_m2} (ref >=60)
GLOBULIN: 2.3 g/dL (ref 1.9–3.7)
Glucose, Bld: 151 mg/dL — ABNORMAL HIGH (ref 65–99)
Potassium: 4.9 mmol/L (ref 3.5–5.3)
SODIUM: 142 mmol/L (ref 135–146)
Total Bilirubin: 0.4 mg/dL (ref 0.2–1.2)
Total Protein: 6.7 g/dL (ref 6.1–8.1)

## 2018-05-23 LAB — HEMOGLOBIN A1C
EAG (MMOL/L): 12 (calc)
HEMOGLOBIN A1C: 9.2 %{Hb} — AB (ref <5.7)
MEAN PLASMA GLUCOSE: 217 (calc)

## 2018-05-23 LAB — LIPID PANEL
Cholesterol: 151 mg/dL (ref <200)
HDL: 79 mg/dL (ref >50)
LDL Cholesterol (Calc): 51 mg/dL (calc)
NON-HDL CHOLESTEROL (CALC): 72 mg/dL (ref <130)
TRIGLYCERIDES: 129 mg/dL (ref <150)
Total CHOL/HDL Ratio: 1.9 (calc) (ref <5.0)

## 2018-05-23 LAB — T4, FREE: FREE T4: 1 ng/dL (ref 0.8–1.8)

## 2018-05-23 LAB — TSH: TSH: 1.1 mIU/L (ref 0.40–4.50)

## 2018-05-23 LAB — VITAMIN D 25 HYDROXY (VIT D DEFICIENCY, FRACTURES): Vit D, 25-Hydroxy: 37 ng/mL (ref 30–100)

## 2018-05-25 ENCOUNTER — Ambulatory Visit: Payer: BLUE CROSS/BLUE SHIELD | Admitting: Family Medicine

## 2018-05-25 ENCOUNTER — Encounter: Payer: Self-pay | Admitting: Family Medicine

## 2018-05-25 VITALS — BP 142/64 | HR 82 | Temp 98.2°F | Resp 18 | Ht 61.0 in | Wt 192.0 lb

## 2018-05-25 DIAGNOSIS — M353 Polymyalgia rheumatica: Secondary | ICD-10-CM | POA: Insufficient documentation

## 2018-05-25 DIAGNOSIS — R413 Other amnesia: Secondary | ICD-10-CM | POA: Diagnosis not present

## 2018-05-25 NOTE — Progress Notes (Signed)
Subjective:    Patient ID: Cassie HessCynthia S Buehrer, female    DOB: 1956-01-17, 62 y.o.   MRN: [id number]  HPI  Patient is a 62 year old Caucasian female establishing today with me.  She is here today requesting referral to her neurologist.  Over the last 6 months, the patient has noticed progressive memory loss.  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  However everything that her attorney asked her to do, she forgets unless she writes it down.  She has done this job for many many years and has never had this issue.  Furthermore, she is driven the same car for 4 years.  However recently she has had a difficult time remembering how to turn on the windshield wiper or how to execute certain task within the car such as opening the back door, etc.  She finds herself forgetting conversation she is having with people.  Her husband or family members will tell her something and she will forget it and have no recollection of the conversation.  This is gradually been worsening over the last 6 months.  Today I performed a Mini-Mental status exam.  She is able to correctly tell me the date and the year quickly with no difficulty.  She also is able to correctly identify the location.  On 3 out of 3 recall, the patient can only remember 2 out of 3.  Patient can correctly spell world in reverse however she performed poorly on serial sevens and only gets 2 out of 5 correct.  The remainder of her Mini-Mental status exam is normal she scores 26 out of 30.  Neurologic exam is performed and cranial nerves II through XII are grossly intact with muscle strength 5/5 equal and symmetric in the upper and lower extremities with normal reflexes checked at the biceps, brachioradialis, patella, and ankle.  She has normal cerebellar exam.  She does report a tremor in her head.  She states that her head will shake and a high frequency manner side to side.  Also her voice will shake at times when she is talking and she has noticed a  high-frequency resting tremor in her hands.  These tend to come and go.  There is no witnessed tremor today.  All of this seemed to occur over the last 6 months Past Medical History:  Diagnosis Date  . CAD (coronary artery disease)    a. DES to prox LAD and [facility]. b. [place] 08[place] s/p DES to OM3, PTCA to small distal and apical LAD too small for stenting, EF normal, diffuse small branch disease.  . Diabetes mellitus, type II (HCC)   . Hyperlipidemia   . Hypertension   . Obesity   . PMR (polymyalgia rheumatica) (HCC)    Past Surgical History:  Procedure Laterality Date  . ABDOMINAL HYSTERECTOMY    . CESAREAN SECTION    . CHOLECYSTECTOMY    . CORONARY STENT INTERVENTION N/A 09/09/2016   Procedure: Coronary Stent Intervention;  Surgeon: Kathleene Hazelhristopher D McAlhany, MD;  [facility];  Service: Cardiovascular;  Laterality: N/A;  OM3 DES 2.25x 20, POBA Distal LAD  . LEFT HEART CATH AND CORONARY ANGIOGRAPHY N/A 09/09/2016   Procedure: Left Heart Cath and Coronary Angiography;  Surgeon: Kathleene Hazelhristopher D McAlhany, MD;  [facility];  Service: Cardiovascular;  Laterality: N/A;   Current Outpatient Medications on File Prior to Visit  Medication Sig Dispense Refill  . aspirin EC 81 MG tablet  Take 81 mg by mouth daily.    . Cranberry 450 MG CAPS Take 450 mg by mouth daily.    . Evolocumab (REPATHA SURECLICK) 140 MG/ML SOAJ Inject 1 pen into the skin every 14 (fourteen) days. 2 pen 11  . Insulin Glargine (TOUJEO SOLOSTAR) 300 UNIT/ML SOPN Inject 70 Units into the skin at bedtime. 9 mL 2  . Insulin Pen Needle (PEN NEEDLES) 32G X 4 MM MISC 1 each by Does not apply route 4 (four) times daily. 300 each 2  . INSULIN SYRINGE .5CC/29G 29G X 1/2" 0.5 ML MISC Use 3 times to inject insulin 100 each 2  . isosorbide mononitrate (IMDUR) 30 MG 24 hr tablet TAKE 1 TABLET(30 MG TOTAL) BY MOUTH DAILY 90 tablet 3  . Lancets (FREESTYLE) lancets Use as instructed 150 each 3    . lisinopril (PRINIVIL,ZESTRIL) 20 MG tablet TAKE 1 TABLET BY MOUTH DAILY 90 tablet 2  . metFORMIN (GLUCOPHAGE-XR) 500 MG 24 hr tablet TAKE 2 TABLETS BY MOUTH DAILY WITH BREAKFAST 180 tablet 0  . metoprolol succinate (TOPROL-XL) 50 MG 24 hr tablet TAKE 1 TABLET BY MOUTH EVERY MORNING. PLEASE SCHEDULE AN APPOINTMENT FOR FUTURE REFILLS 90 tablet 3  . nitroGLYCERIN (NITROSTAT) 0.4 MG SL tablet Place 1 tablet (0.4 mg total) under the tongue every 5 (five) minutes as needed for chest pain. 25 tablet 4  . NOVOLOG FLEXPEN 100 UNIT/ML FlexPen INJECT 15 TO 21 UNITS UNDER THE SKIN THREE TIMES DAILY WITH MEALS 60 mL 2  . venlafaxine XR (EFFEXOR-XR) 75 MG 24 hr capsule Take 75 mg by mouth daily with breakfast.     No current facility-administered medications on file prior to visit.    No Known Allergies Social History   Socioeconomic History  . Marital status: Married    Spouse name: Not on file  . Number of children: Not on file  . Years of education: Not on file  . Highest education level: Not on file  Occupational History  . Not on file  Social Needs  . Financial resource strain: Not on file  . Food insecurity:    Worry: Not on file    Inability: Not on file  . Transportation needs:    Medical: Not on file    Non-medical: Not on file  Tobacco Use  . Smoking status: Never Smoker  . Smokeless tobacco: Never Used  Substance and Sexual Activity  . Alcohol use: No    Alcohol/week: 0.0 standard drinks  . Drug use: No  . Sexual activity: Not on file  Lifestyle  . Physical activity:    Days per week: Not on file    Minutes per session: Not on file  . Stress: Not on file  Relationships  . Social connections:    Talks on phone: Not on file    Gets together: Not on file    Attends religious service: Not on file    Active member of club or organization: Not on file    Attends meetings of clubs or organizations: Not on file    Relationship status: Not on file  . Intimate partner  violence:    Fear of current or ex partner: Not on file    Emotionally abused: Not on file    Physically abused: Not on file    Forced sexual activity: Not on file  Other Topics Concern  . Not on file  Social History Narrative  . Not on file     Review of Systems  All other systems reviewed and are negative.      Objective:   Physical Exam  Constitutional: She is oriented to person, place, and time. She appears well-developed and well-nourished. No distress.  HENT:  Head: Normocephalic and atraumatic.  Nose: Nose normal.  Mouth/Throat: Oropharynx is clear and moist. No oropharyngeal exudate.  Eyes: Pupils are equal, round, and reactive to light. Conjunctivae are normal.  Neck: No JVD present. No thyromegaly present.  Cardiovascular: Normal rate, regular rhythm, normal heart sounds and intact distal pulses. Exam reveals no gallop and no friction rub.  No murmur heard. Pulmonary/Chest: Effort normal and breath sounds normal. No stridor. No respiratory distress. She has no wheezes. She has no rales.  Abdominal: Soft. Bowel sounds are normal. She exhibits no distension and no mass. There is no tenderness. There is no rebound and no guarding. No hernia.  Lymphadenopathy:    She has no cervical adenopathy.  Neurological: She is alert and oriented to person, place, and time. She displays normal reflexes. No cranial nerve deficit or sensory deficit. She exhibits normal muscle tone. Coordination normal.  Skin: She is not diaphoretic.  Vitals reviewed.         Assessment & Plan:  Memory loss - Plan: CBC with Differential/Platelet, Vitamin B12, COMPLETE METABOLIC PANEL WITH GFR, TSH, Ambulatory referral to Neurology, MR Brain W Wo Contrast  I do not believe the patient's memory loss is related to the tremor.  The patient's tremor sounds similar to a benign essential tremor area however I am concerned by the memory loss.  Given her past medical history I am concerned about the onset  of vascular dementia.  Therefore I recommended obtaining an MRI of the brain to rule out subtle small strokes that may have occurred in the past particular given her history of coronary artery disease.  I will also obtain lab work to look for reversible causes of memory loss such as vitamin B12, TSH, CMP, and a CBC.  Per the patient's request, I will consult neurology.  Because of her insurance, she is unable to see a specialist in the coming system and is requesting referral to [facility] which I will happily make for her.  Await the results of the MRI but I suspect that the patient may have early signs of vascular dementia.

## 2018-05-26 LAB — CBC WITH DIFFERENTIAL/PLATELET
BASOS PCT: 0.3 %
Basophils Absolute: 26 cells/uL (ref 0–200)
EOS PCT: 0.3 %
Eosinophils Absolute: 26 cells/uL (ref 15–500)
HEMATOCRIT: 43.3 % (ref 35.0–45.0)
HEMOGLOBIN: 14.9 g/dL (ref 11.7–15.5)
LYMPHS ABS: 1789 {cells}/uL (ref 850–3900)
MCH: 33 pg (ref 27.0–33.0)
MCHC: 34.4 g/dL (ref 32.0–36.0)
MCV: 96 fL (ref 80.0–100.0)
MPV: 11.2 fL (ref 7.5–12.5)
Monocytes Relative: 4 %
NEUTROS ABS: 6416 {cells}/uL (ref 1500–7800)
NEUTROS PCT: 74.6 %
Platelets: 259 10*3/uL (ref 140–400)
RBC: 4.51 10*6/uL (ref 3.80–5.10)
RDW: 13.7 % (ref 11.0–15.0)
Total Lymphocyte: 20.8 %
WBC: 8.6 10*3/uL (ref 3.8–10.8)
WBCMIX: 344 {cells}/uL (ref 200–950)

## 2018-05-26 LAB — COMPLETE METABOLIC PANEL WITH GFR
AG RATIO: 1.7 (calc) (ref 1.0–2.5)
ALBUMIN MSPROF: 4 g/dL (ref 3.6–5.1)
ALT: 26 U/L (ref 6–29)
AST: 21 U/L (ref 10–35)
Alkaline phosphatase (APISO): 64 U/L (ref 33–130)
BILIRUBIN TOTAL: 0.5 mg/dL (ref 0.2–1.2)
BUN: 19 mg/dL (ref 7–25)
CO2: 27 mmol/L (ref 20–32)
Calcium: 9.7 mg/dL (ref 8.6–10.4)
Chloride: 98 mmol/L (ref 98–110)
Creat: 0.91 mg/dL (ref 0.50–0.99)
GFR, EST AFRICAN AMERICAN: 79 mL/min/{1.73_m2} (ref >=60)
GFR, EST NON AFRICAN AMERICAN: 69 mL/min/{1.73_m2} (ref >=60)
Globulin: 2.3 g/dL (calc) (ref 1.9–3.7)
Glucose, Bld: 343 mg/dL — ABNORMAL HIGH (ref 65–99)
Potassium: 4.9 mmol/L (ref 3.5–5.3)
Sodium: 138 mmol/L (ref 135–146)
TOTAL PROTEIN: 6.3 g/dL (ref 6.1–8.1)

## 2018-05-26 LAB — TSH: TSH: 0.7 m[IU]/L (ref 0.40–4.50)

## 2018-05-26 LAB — VITAMIN B12: Vitamin B-12: 313 pg/mL (ref 200–1100)

## 2018-06-01 ENCOUNTER — Other Ambulatory Visit: Payer: Self-pay | Admitting: "Endocrinology

## 2018-06-05 ENCOUNTER — Ambulatory Visit: Payer: BLUE CROSS/BLUE SHIELD | Admitting: "Endocrinology

## 2018-06-05 ENCOUNTER — Encounter: Payer: Self-pay | Admitting: "Endocrinology

## 2018-06-05 VITALS — BP 136/81 | HR 70 | Ht 61.0 in | Wt 189.0 lb

## 2018-06-05 DIAGNOSIS — E782 Mixed hyperlipidemia: Secondary | ICD-10-CM | POA: Diagnosis not present

## 2018-06-05 DIAGNOSIS — I10 Essential (primary) hypertension: Secondary | ICD-10-CM

## 2018-06-05 DIAGNOSIS — E1159 Type 2 diabetes mellitus with other circulatory complications: Secondary | ICD-10-CM | POA: Diagnosis not present

## 2018-06-05 NOTE — Patient Instructions (Signed)

## 2018-06-05 NOTE — Progress Notes (Signed)
Endocrinology follow-up note  Subjective:    Patient ID: Cassie Cordova, female    DOB: [birth year],    Past Medical History:  Diagnosis Date  . CAD (coronary artery disease)    a. DES to prox LAD and staged DES to LPDA 2012 at [facility] [place] [facility]. b. [place] 08/2016 s/p DES to OM3, PTCA to small distal and apical LAD too small for stenting, EF normal, diffuse small branch disease.  . Diabetes mellitus, type II (HCC)   . Hyperlipidemia   . Hypertension   . Obesity   . PMR (polymyalgia rheumatica) (HCC)    Past Surgical History:  Procedure Laterality Date  . ABDOMINAL HYSTERECTOMY    . CESAREAN SECTION    . CHOLECYSTECTOMY    . CORONARY STENT INTERVENTION N/A 09/09/2016   Procedure: Coronary Stent Intervention;  Surgeon: Kathleene Hazel, MD;  Location: [facility] INVASIVE CV LAB;  Service: Cardiovascular;  Laterality: N/A;  OM3 DES 2.25x 20, POBA Distal LAD  . LEFT HEART CATH AND CORONARY ANGIOGRAPHY N/A 09/09/2016   Procedure: Left Heart Cath and Coronary Angiography;  Surgeon: Kathleene Hazel, MD;  Location: [facility] INVASIVE CV LAB;  Service: Cardiovascular;  Laterality: N/A;   Social History   Socioeconomic History  . Marital status: Married    Spouse name: Not on file  . Number of children: Not on file  . Years of education: Not on file  . Highest education level: Not on file  Occupational History  . Not on file  Social Needs  . Financial resource strain: Not on file  . Food insecurity:    Worry: Not on file    Inability: Not on file  . Transportation needs:    Medical: Not on file    Non-medical: Not on file  Tobacco Use  . Smoking status: Never Smoker  . Smokeless tobacco: Never Used  Substance and Sexual Activity  . Alcohol use: No    Alcohol/week: 0.0 standard drinks  . Drug use: No  . Sexual activity: Not on file  Lifestyle  . Physical activity:    Days per week: Not on file    Minutes per session: Not on file  . Stress: Not on file  Relationships  . Social  connections:    Talks on phone: Not on file    Gets together: Not on file    Attends religious service: Not on file    Active member of club or organization: Not on file    Attends meetings of clubs or organizations: Not on file    Relationship status: Not on file  Other Topics Concern  . Not on file  Social History Narrative  . Not on file   Outpatient Encounter Medications as of 06/05/2018  Medication Sig  . Calcium Citrate (CITRACAL PO) Take by mouth daily.  . folic acid (FOLVITE) 1 MG tablet Take 1 mg by mouth daily.  . Methotrexate, Anti-Rheumatic, (OTREXUP Willis) Inject into the skin once a week.  Marland Kitchen aspirin EC 81 MG tablet Take 81 mg by mouth daily.  . Evolocumab (REPATHA SURECLICK) 140 MG/ML SOAJ Inject 1 pen into the skin every 14 (fourteen) days.  . Insulin Pen Needle (PEN NEEDLES) 32G X 4 MM MISC 1 each by Does not apply route 4 (four) times daily.  . INSULIN SYRINGE .5CC/29G 29G X 1/2" 0.5 ML MISC Use 3 times to inject insulin  . isosorbide mononitrate (IMDUR) 30 MG 24 hr tablet TAKE 1 TABLET(30 MG TOTAL) BY MOUTH DAILY  . Lancets (  FREESTYLE) lancets Use as instructed  . lisinopril (PRINIVIL,ZESTRIL) 20 MG tablet TAKE 1 TABLET BY MOUTH DAILY  . metFORMIN (GLUCOPHAGE-XR) 500 MG 24 hr tablet TAKE 2 TABLETS BY MOUTH DAILY WITH BREAKFAST  . metoprolol succinate (TOPROL-XL) 50 MG 24 hr tablet TAKE 1 TABLET BY MOUTH EVERY MORNING. PLEASE SCHEDULE AN APPOINTMENT FOR FUTURE REFILLS  . nitroGLYCERIN (NITROSTAT) 0.4 MG SL tablet Place 1 tablet (0.4 mg total) under the tongue every 5 (five) minutes as needed for chest pain.  Marland Kitchen NOVOLOG FLEXPEN 100 UNIT/ML FlexPen INJECT 15 TO 21 UNITS UNDER THE SKIN THREE TIMES DAILY WITH MEALS  . TOUJEO SOLOSTAR 300 UNIT/ML SOPN INJECT 70 UNITS UNDER THE SKIN AT BEDTIME  . venlafaxine XR (EFFEXOR-XR) 75 MG 24 hr capsule Take 75 mg by mouth daily with breakfast.  . [DISCONTINUED] Cranberry 450 MG CAPS Take 450 mg by mouth daily.   No facility-administered  encounter medications on file as of 06/05/2018.    ALLERGIES: No Known Allergies VACCINATION STATUS:  There is no immunization history on file for this patient.  Diabetes  She presents for her follow-up diabetic visit. She has type 2 diabetes mellitus. Onset time: She was diagnosed at approximate age of 47 years. Her disease course has been worsening. There are no hypoglycemic associated symptoms. Pertinent negatives for hypoglycemia include no confusion, headaches, pallor or seizures. Associated symptoms include polydipsia and polyuria. Pertinent negatives for diabetes include no chest pain, no fatigue and no polyphagia. There are no hypoglycemic complications. Symptoms are worsening. Diabetic complications include heart disease. Risk factors for coronary artery disease include diabetes mellitus, dyslipidemia, hypertension, sedentary lifestyle and obesity. Current diabetic treatment includes insulin injections and oral agent (monotherapy). She is compliant with treatment some of the time (She admits inconsistency in taking her invokana and Trulicity. She also missed some doses of Toujeo as well.). Her weight is fluctuating minimally. She is following a generally unhealthy diet. When asked about meal planning, she reported none. She has had a previous visit with a dietitian. She rarely participates in exercise. Blood glucose monitoring compliance is inadequate. Her home blood glucose trend is decreasing steadily. Her breakfast blood glucose range is generally >200 mg/dl. Her lunch blood glucose range is generally >200 mg/dl. Her dinner blood glucose range is generally >200 mg/dl. Her bedtime blood glucose range is generally >200 mg/dl. Her overall blood glucose range is >200 mg/dl. An ACE inhibitor/angiotensin II receptor blocker is being taken. Eye exam is current.  Hypertension  This is a chronic problem. The problem is controlled. Pertinent negatives include no chest pain, headaches, palpitations or  shortness of breath. Risk factors for coronary artery disease include dyslipidemia, diabetes mellitus, sedentary lifestyle, obesity and post-menopausal state. Past treatments include ACE inhibitors. The current treatment provides no improvement.  Hyperlipidemia  This is a chronic problem. The current episode started more than 1 year ago. The problem is controlled. Recent lipid tests were reviewed and are normal. Exacerbating diseases include diabetes and obesity. Pertinent negatives include no chest pain, myalgias or shortness of breath. Current antihyperlipidemic treatment includes statins. Risk factors for coronary artery disease include dyslipidemia, diabetes mellitus, hypertension, obesity, a sedentary lifestyle and post-menopausal.    Review of Systems  Constitutional: Negative for fatigue and unexpected weight change.  HENT: Negative for trouble swallowing and voice change.   Eyes: Negative for visual disturbance.  Respiratory: Negative for cough, shortness of breath and wheezing.   Cardiovascular: Negative for chest pain, palpitations and leg swelling.  Gastrointestinal: Negative for diarrhea, nausea  and vomiting.  Endocrine: Positive for polydipsia and polyuria. Negative for cold intolerance, heat intolerance and polyphagia.  Musculoskeletal: Negative for arthralgias and myalgias.  Skin: Negative for color change, pallor, rash and wound.  Neurological: Negative for seizures and headaches.  Psychiatric/Behavioral: Negative for confusion and suicidal ideas.    Objective:    BP 136/81   Pulse 70   Ht 5\' 1"  (1.549 m)   Wt 189 lb (85.7 kg)   BMI 35.71 kg/m   Wt Readings from Last 3 Encounters:  06/05/18 189 lb (85.7 kg)  05/25/18 192 lb (87.1 kg)  01/31/18 185 lb (83.9 kg)    Physical Exam  Constitutional: She is oriented to person, place, and time. She appears well-developed.  HENT:  Head: Normocephalic and atraumatic.  Eyes: EOM are normal.  Neck: Normal range of motion.  Neck supple. No tracheal deviation present. No thyromegaly present.  Cardiovascular: Normal rate.  Pulmonary/Chest: Effort normal.  Abdominal: There is no abdominal tenderness. There is no guarding.  Musculoskeletal: Normal range of motion.        General: No edema.  Neurological: She is alert and oriented to person, place, and time. No cranial nerve deficit. Coordination normal.  Skin: Skin is warm and dry. No rash noted. No erythema. No pallor.  Psychiatric: She has a normal mood and affect. Judgment normal.    Results for orders placed or performed in visit on 05/25/18  CBC with Differential/Platelet  Result Value Ref Range   WBC 8.6 3.8 - 10.8 Thousand/uL   RBC 4.51 3.80 - 5.10 Million/uL   Hemoglobin 14.9 11.7 - 15.5 g/dL   HCT 40.9 81.1 - 91.4 %   MCV 96.0 80.0 - 100.0 fL   MCH 33.0 27.0 - 33.0 pg   MCHC 34.4 32.0 - 36.0 g/dL   RDW 78.2 95.6 - 21.3 %   Platelets 259 140 - 400 Thousand/uL   MPV 11.2 7.5 - 12.5 fL   Neutro Abs 6,416 1,500 - 7,800 cells/uL   Lymphs Abs 1,789 850 - 3,900 cells/uL   WBC mixed population 344 200 - 950 cells/uL   Eosinophils Absolute 26 15 - 500 cells/uL   Basophils Absolute 26 0 - 200 cells/uL   Neutrophils Relative % 74.6 %   Total Lymphocyte 20.8 %   Monocytes Relative 4.0 %   Eosinophils Relative 0.3 %   Basophils Relative 0.3 %  Vitamin B12  Result Value Ref Range   Vitamin B-12 313 200 - 1,100 pg/mL  COMPLETE METABOLIC PANEL WITH GFR  Result Value Ref Range   Glucose, Bld 343 (H) 65 - 99 mg/dL   BUN 19 7 - 25 mg/dL   Creat 0.86 5.78 - 4.69 mg/dL   GFR, Est Non African American 69 > OR = 60 mL/min/1.89m2   GFR, Est African American 79 > OR = 60 mL/min/1.64m2   BUN/Creatinine Ratio NOT APPLICABLE 6 - 22 (calc)   Sodium 138 135 - 146 mmol/L   Potassium 4.9 3.5 - 5.3 mmol/L   Chloride 98 98 - 110 mmol/L   CO2 27 20 - 32 mmol/L   Calcium 9.7 8.6 - 10.4 mg/dL   Total Protein 6.3 6.1 - 8.1 g/dL   Albumin 4.0 3.6 - 5.1 g/dL   Globulin  2.3 1.9 - 3.7 g/dL (calc)   AG Ratio 1.7 1.0 - 2.5 (calc)   Total Bilirubin 0.5 0.2 - 1.2 mg/dL   Alkaline phosphatase (APISO) 64 33 - 130 U/L   AST 21 10 -  35 U/L   ALT 26 6 - 29 U/L  TSH  Result Value Ref Range   TSH 0.70 0.40 - 4.50 mIU/L   Complete Blood Count (Most recent): Lab Results  Component Value Date   WBC 8.6 05/25/2018   HGB 14.9 05/25/2018   HCT 43.3 05/25/2018   MCV 96.0 05/25/2018   PLT 259 05/25/2018   Diabetic Labs (most recent): Lab Results  Component Value Date   HGBA1C 9.2 (H) 05/22/2018   HGBA1C 8.4 (H) 01/25/2018   HGBA1C 8.8 (H) 10/24/2017   Lipid Panel     Component Value Date/Time   CHOL 151 05/22/2018 0735   TRIG 129 05/22/2018 0735   HDL 79 05/22/2018 0735   CHOLHDL 1.9 05/22/2018 0735   VLDL 38 (H) 10/13/2016 0749   LDLCALC 51 05/22/2018 0735   LDLDIRECT 134 (H) 02/06/2018 1031    Assessment & Plan:   1. Type 2 diabetes mellitus with vascular disease (HCC)  -Her diabetes is  complicated by coronary artery disease and patient remains at a high risk for more acute and chronic complications of diabetes which include CAD, CVA, CKD, retinopathy, and neuropathy. These are all discussed in detail with the patient.  She came with significantly above target A1c of 9.2%, increasing from 8.4%.    -Her glucose/insulin logs reveal significant gaps, 30-50 %  missed opportunities for prandial insulin injections.    - She still follows random meal/injection timing.   - I have re-counseled the patient on better engagement in self-care by proper monitoring of blood glucose for safe use of insulin, and diet management and weight loss  by adopting a carbohydrate restricted / protein rich  Diet.  -  Suggestion is made for her to avoid simple carbohydrates  from her diet including Cakes, Sweet Desserts / Pastries, Ice Cream, Soda (diet and regular), Sweet Tea, Candies, Chips, Cookies, Store Bought Juices, Alcohol in Excess of  1-2 drinks a day, Artificial  Sweeteners, and "Sugar-free" Products. This will help patient to have stable blood glucose profile and potentially avoid unintended weight gain. - Patient is advised to stick to a routine mealtimes to eat 3 meals  a day and avoid unnecessary snacks ( to snack only to correct hypoglycemia).  - I have approached patient with the following individualized plan to manage diabetes and patient agrees.  -The dose of insulin in the planned regimen is adequate if it is implemented properly.   She has no documented or reported hypoglycemia . - She is reapproached for better engagement.  -She is advised to continue Toujeo 70 units nightly, continue NovoLog 15 units 3 times a day before meals  for pre-meal blood glucose above 90 mg/dL plus patient specific correction dose. - she is advised to continue strict documentation of blood glucose 4 times a day-before meals and at bedtime.  -Patient is encouraged to call clinic for blood glucose levels less than 70 or above 200 mg /dl.  - She is tolerating metformin better.  She is advised to continue metformin 1000 mg extended release once a day after breakfast.    - Patient specific target  for A1c; LDL, HDL, Triglycerides, and  Waist Circumference were discussed in detail.  2) BP/HTN: Her blood pressure is controlled to target.  She is advised to continue her blood pressure medications including lisinopril 20 mg p.o. daily.    3) Lipids/HPL: Recent labs showed controlled LDL at 71.  She reports intolerance to atorvastatin currently at 80 mg p.o. nightly.  She would not consider a lower dose of atorvastatin which she may actually tolerate.  She is being evaluated by her cardiologist for utility of Repatha treatment.  She will need repeat fasting lipid panel before her next visit.     4)  Weight/Diet: CDE consult in progress, exercise, and carbohydrates information provided.  5) Chronic Care/Health Maintenance:  -Patient  on ACEI/ARB and Statin medications and  encouraged to continue to follow up with Ophthalmology, Cardiology,  Podiatrist at least yearly or according to recommendations, and advised to  stay away from smoking. I have recommended yearly flu vaccine and pneumonia vaccination at least every 5 years; and  sleep for at least 7 hours a day. -Unfortunately, she says that her current insurance will not allow her to continue to see me and currently looking for another endocrinologist in her area.  - Time spent with the patient: 25 min, of which >50% was spent in reviewing her blood glucose logs , discussing her hypo- and hyper-glycemic episodes, reviewing her current and  previous labs and insulin doses and developing a plan to avoid hypo- and hyper-glycemia. Please refer to Patient Instructions for Blood Glucose Monitoring and Insulin/Medications Dosing Guide"  in media tab for additional information. Sherald Hessynthia S Cheese participated in the discussions, expressed understanding, and voiced agreement with the above plans.  All questions were answered to her satisfaction. she is encouraged to contact clinic should she have any questions or concerns prior to her return visit.  Follow up plan: Return in about 4 months (around 10/05/2018) for Follow up with Pre-visit Labs, Meter, and Logs.  Marquis LunchGebre Iman Orourke, MD Phone: [phone]  Fax: [phone]   -  This note was partially dictated with voice recognition software. Similar sounding words can be transcribed inadequately or may not  be corrected upon review.  06/05/2018, 4:26 PM

## 2018-06-11 ENCOUNTER — Ambulatory Visit
Admission: RE | Admit: 2018-06-11 | Discharge: 2018-06-11 | Disposition: A | Discharge status: Home / Self Care | Payer: BLUE CROSS/BLUE SHIELD | Source: Ambulatory Visit | Attending: Family Medicine | Admitting: Family Medicine

## 2018-06-11 ENCOUNTER — Other Ambulatory Visit: Payer: Self-pay | Admitting: Cardiovascular Disease

## 2018-06-11 DIAGNOSIS — R413 Other amnesia: Secondary | ICD-10-CM

## 2018-06-11 MED ORDER — GADOBENATE DIMEGLUMINE 529 MG/ML IV SOLN
18.0000 mL | Freq: Once | INTRAVENOUS | Status: AC | PRN
  Administered 2018-06-11: 18 mL via INTRAVENOUS

## 2018-06-22 ENCOUNTER — Other Ambulatory Visit: Payer: Self-pay | Admitting: *Deleted

## 2018-06-22 DIAGNOSIS — R413 Other amnesia: Secondary | ICD-10-CM

## 2018-06-24 ENCOUNTER — Other Ambulatory Visit: Payer: Self-pay | Admitting: "Endocrinology

## 2018-06-28 ENCOUNTER — Encounter
Admit: 2018-06-28 | Discharge: 2018-06-29 | Payer: PRIVATE HEALTH INSURANCE | Attending: Cardiovascular Disease | Primary: Cardiovascular Disease

## 2018-06-28 DIAGNOSIS — I2510 Atherosclerotic heart disease of native coronary artery without angina pectoris: Principal | ICD-10-CM

## 2018-06-28 MED ORDER — LISINOPRIL 5 MG TABLET: 5 mg | tablet | Freq: Every day | 3 refills | 0 days | Status: AC

## 2018-06-28 MED ORDER — LISINOPRIL 5 MG TABLET
ORAL_TABLET | Freq: Every day | ORAL | 3 refills | 0.00000 days | Status: CP
Start: 2018-06-28 — End: 2018-06-28

## 2018-06-28 MED ORDER — LISINOPRIL 40 MG TABLET
ORAL_TABLET | Freq: Every day | ORAL | 3 refills | 0.00000 days | Status: CP
Start: 2018-06-28 — End: 2018-12-20

## 2018-07-11 ENCOUNTER — Encounter: Admit: 2018-07-11 | Discharge: 2018-07-12 | Payer: PRIVATE HEALTH INSURANCE

## 2018-07-11 DIAGNOSIS — F0280 Dementia in other diseases classified elsewhere without behavioral disturbance: Principal | ICD-10-CM

## 2018-07-11 DIAGNOSIS — F329 Major depressive disorder, single episode, unspecified: Secondary | ICD-10-CM

## 2018-07-11 DIAGNOSIS — G4700 Insomnia, unspecified: Secondary | ICD-10-CM

## 2018-07-23 ENCOUNTER — Encounter: Admit: 2018-07-23 | Discharge: 2018-07-23 | Payer: PRIVATE HEALTH INSURANCE

## 2018-07-23 DIAGNOSIS — G4730 Sleep apnea, unspecified: Principal | ICD-10-CM

## 2018-07-25 ENCOUNTER — Encounter
Admit: 2018-07-25 | Discharge: 2018-07-26 | Payer: PRIVATE HEALTH INSURANCE | Attending: Internal Medicine | Primary: Internal Medicine

## 2018-07-25 ENCOUNTER — Encounter: Admit: 2018-07-25 | Discharge: 2018-07-26 | Payer: PRIVATE HEALTH INSURANCE

## 2018-07-25 DIAGNOSIS — Z1231 Encounter for screening mammogram for malignant neoplasm of breast: Principal | ICD-10-CM

## 2018-07-25 DIAGNOSIS — E782 Mixed hyperlipidemia: Secondary | ICD-10-CM

## 2018-07-25 DIAGNOSIS — E1165 Type 2 diabetes mellitus with hyperglycemia: Principal | ICD-10-CM

## 2018-07-25 DIAGNOSIS — Z794 Long term (current) use of insulin: Secondary | ICD-10-CM

## 2018-07-25 DIAGNOSIS — I10 Essential (primary) hypertension: Secondary | ICD-10-CM

## 2018-07-25 DIAGNOSIS — Z1211 Encounter for screening for malignant neoplasm of colon: Secondary | ICD-10-CM

## 2018-07-25 DIAGNOSIS — I2510 Atherosclerotic heart disease of native coronary artery without angina pectoris: Secondary | ICD-10-CM

## 2018-07-25 MED ORDER — METFORMIN ER 500 MG TABLET,EXTENDED RELEASE 24 HR
ORAL_TABLET | ORAL | 3 refills | 0 days | Status: CP
Start: 2018-07-25 — End: 2018-12-20

## 2018-07-26 MED ORDER — CANAGLIFLOZIN 300 MG TABLET
ORAL_TABLET | ORAL | 3 refills | 0 days | Status: CP
Start: 2018-07-26 — End: 2018-10-23

## 2018-07-26 MED ORDER — DULAGLUTIDE 0.75 MG/0.5 ML SUBCUTANEOUS PEN INJECTOR
INJECTION | SUBCUTANEOUS | 4 refills | 0 days | Status: CP
Start: 2018-07-26 — End: 2018-10-18

## 2018-07-26 MED ORDER — OTREXUP (PF) 15 MG/0.4 ML SUBCUTANEOUS AUTO-INJECTOR
INJECTION | SUBCUTANEOUS | 0 refills | 0 days
Start: 2018-07-26 — End: 2018-10-18

## 2018-09-05 ENCOUNTER — Other Ambulatory Visit: Payer: Self-pay | Admitting: "Endocrinology

## 2018-09-15 ENCOUNTER — Other Ambulatory Visit: Payer: Self-pay | Admitting: "Endocrinology

## 2018-09-24 MED ORDER — BLOOD-GLUCOSE METER KIT WRAPPER
0 refills | 0 days | Status: CP
Start: 2018-09-24 — End: 2019-09-24

## 2018-09-24 MED ORDER — BLOOD SUGAR DIAGNOSTIC STRIPS
Freq: Every day | 2 refills | 0.00000 days | Status: CP
Start: 2018-09-24 — End: ?

## 2018-09-24 MED ORDER — LANCETS 28 GAUGE
2 refills | 0 days | Status: CP
Start: 2018-09-24 — End: ?

## 2018-10-18 ENCOUNTER — Encounter: Admit: 2018-10-18 | Discharge: 2018-10-19 | Payer: PRIVATE HEALTH INSURANCE

## 2018-10-18 DIAGNOSIS — M2540 Effusion, unspecified joint: Secondary | ICD-10-CM

## 2018-10-18 DIAGNOSIS — M2560 Stiffness of unspecified joint, not elsewhere classified: Secondary | ICD-10-CM

## 2018-10-18 DIAGNOSIS — R52 Pain, unspecified: Secondary | ICD-10-CM

## 2018-10-18 DIAGNOSIS — M069 Rheumatoid arthritis, unspecified: Secondary | ICD-10-CM

## 2018-10-18 DIAGNOSIS — R899 Unspecified abnormal finding in specimens from other organs, systems and tissues: Secondary | ICD-10-CM

## 2018-10-18 DIAGNOSIS — M0609 Rheumatoid arthritis without rheumatoid factor, multiple sites: Principal | ICD-10-CM

## 2018-10-18 MED ORDER — PREDNISONE 5 MG TABLET
ORAL_TABLET | Freq: Every day | ORAL | 3 refills | 0.00000 days | Status: CP
Start: 2018-10-18 — End: 2019-01-16

## 2018-10-18 MED ORDER — OTREXUP (PF) 20 MG/0.4 ML SUBCUTANEOUS AUTO-INJECTOR
SUBCUTANEOUS | 3 refills | 84 days | Status: CP
Start: 2018-10-18 — End: ?
  Filled 2018-10-23: qty 4.8, 84d supply, fill #0

## 2018-10-18 NOTE — Unmapped (Signed)
Patient Name: Madison Harris  PCP: ??Doroteo Bradford, MD  Source of History: patient and records  Date of Visit: October 18, 2018 1:15 PM    REASON FOR VISIT: transfer of care for RA (+CCP) vs PMR    Identification: Pt self identified using name and date of birth  Patient location: [place] home, [phone]  The limitations of this telemedicine encounter were discussed with patient. Both the patient and myself agreed to this encounter despite these limitations. Benefits of this telemedicine encounter included allowing for continued care of patient and minimizing risk of exposure to COVID-19. Patient also aware that this is a billable encounter with possible copay.     PRIOR RHEUMATOLOGIC HISTORY:?? per records, initially thought to be PMR but could not taper off prednisone. RF neg but CCP 40's. RA management with methotrexate. [facility].    HPI: Madison Harris is a 63 y.o. female who is presenting to establish rheumatologic care for probable RA vs PMR. Patient could  not connect by Amwell Epic so converted to Doximity Video.    Patient initially presented to NP Wallace Cullens at [facility] a year ago. Symptoms started 3-4 months ago before she saw NP Wallace Cullens. She had trouble walking and moving her neck as well as hips. She reports the hips were so stiff so she could not walk and had gelling phenomena. She had initially complained of symptoms to her endocrinologist for DM who felt her blood sugars were not well controlled.  Due to insurance issues, she needs to switch providers to [facility]. She has been tapering prednisone and will drop to 5 mg daily today. She has been on methotrexate 15 mg [place] qweek since November 2019. Had labs in [facility] in December 2019 and [facility] 07/2018 with no signs of BM and liver toxicity. She is tolerating Otrexup 15 mg [place] qweek with folic acid 1 mg daily. Never took oral methotrexate.  No fevers, coughing, shortness of breath or chest pain.  She is working for an [employer] and works with public on the daily basis.  Currently, she has some pain and stiffness in her hips, elbows, wrists, feet. No morning stiffness. She feels about 90% improved on current regimen.    ROS??: Attests to the above, otherwise, review of all other systems is negative.  ????  Past Medical and Surgical History:  ??  Patient Active Problem List    Diagnosis Date Noted   ??? T2DM, uncontrolled, on insulin 07/25/2018   ??? CAD s/p PCI 07/25/2018   ??? Essential hypertension 07/25/2018   ??? Mixed hyperlipidemia 07/25/2018   ??? Personal history of noncompliance with medical treatment, presenting hazards to health 04/04/2016     Past Surgical History:   Procedure Laterality Date   ??? HYSTERECTOMY       Allergies:   ??  Allergies   Allergen Reactions   ??? Pravastatin Muscle Pain     Other Reaction: myalgia     Current Outpatient Medications:  ??  Current Outpatient Medications on File Prior to Visit   Medication Sig Dispense Refill   ??? aspirin (ECOTRIN) 81 MG tablet Take 81 mg by mouth.     ??? blood sugar diagnostic Strp by Other route daily. 200 each 2   ??? blood-glucose meter kit Check blood sugar daily. 1 each 0   ??? canagliflozin (INVOKANA) 300 mg Tab tablet Take 0.5 tablets (150 mg total) by mouth daily for 14 days, THEN 1 tablet (300 mg total) daily. 90 tablet 3   ???  evolocumab 140 mg/mL PnIj Inject 1 pen under the skin.     ??? folic acid (FOLVITE) 1 MG tablet Take 1 mg by mouth.     ??? insulin ASPART (NOVOLOG FLEXPEN U-100 INSULIN) 100 unit/mL (3 mL) injection pen INJECT 15 TO 21 UNITS UNDER THE SKIN THREE TIMES DAILY WITH MEALS     ??? isosorbide mononitrate (IMDUR) 30 MG 24 hr tablet Take 30 mg by mouth daily.     ??? lancets (FREESTYLE) 28 gauge Misc USE AS INSTRUCTED 200 each 2   ??? lisinopril (PRINIVIL,ZESTRIL) 40 MG tablet Take 1 tablet (40 mg total) by mouth daily. 90 tablet 3   ??? metFORMIN (GLUCOPHAGE-XR) 500 MG 24 hr tablet Take 2 tablets (1,000 mg total) by mouth every morning AND 1 tablet (500 mg total) nightly. 180 tablet 3   ??? methotrexate, PF, (OTREXUP, PF,) 15 mg/0.4 mL AtIn Inject 15 mg under the skin every seven (7) days. 3 Syringe 0   ??? metoprolol succinate (TOPROL-XL) 50 MG 24 hr tablet Take 1 tablet (50 mg total) by mouth daily.     ??? nitroglycerin (NITROSTAT) 0.4 MG SL tablet PLACE 1 TABLET UNDER THE TONGUE EVERY 5 MINUTES AS NEEDED FOR CHEST PAIN UP TO 3 DOSES THEN CALL 911 IF NO RELIEF     ??? predniSONE (DELTASONE) 1 MG tablet      ??? venlafaxine (EFFEXOR-XR) 75 MG 24 hr capsule Take 75 mg by mouth.       No current facility-administered medications on file prior to visit.      Family History:  ??  Family History   Problem Relation Age of Onset   ??? Cancer Mother    ??? Cancer Brother    ??? Cancer Maternal Grandmother    ??? Diabetes Maternal Grandmother    ??? Heart disease Maternal Grandmother    ??? Hypertension Maternal Grandmother    ?  ????  ????SOCIAL HISTORY: ????  ?  Social History     Socioeconomic History   ??? Marital status: Not on file     Spouse name: Not on file   ??? Number of children: Not on file   ??? Years of education: Not on file   ??? Highest education level: Not on file   Occupational History   ??? Occupation: IT consultant   Social Needs   ??? Financial resource strain: Not on file   ??? Food insecurity     Worry: Not on file     Inability: Not on file   ??? Transportation needs     Medical: Not on file     Non-medical: Not on file   Tobacco Use   ??? Smoking status: Never Smoker   ??? Smokeless tobacco: Never Used   Substance and Sexual Activity   ??? Alcohol use: Yes   ??? Drug use: Never   ??? Sexual activity: Not on file   Lifestyle   ??? Physical activity     Days per week: Not on file     Minutes per session: Not on file   ??? Stress: Not on file   Relationships   ??? Social Wellsite geologist on phone: Not on file     Gets together: Not on file     Attends religious service: Not on file     Active member of club or organization: Not on file     Attends meetings of clubs or organizations: Not on file     Relationship status: Not  on file   Other Topics Concern   ??? Not on file   Social History Narrative   ??? Not on file   ?  ??  Immunization History   Administered Date(s) Administered   ??? Influenza Vaccine Quad (IIV4 PF) 28mo+ injectable 04/26/2013, 04/25/2014, 04/22/2015, 07/25/2018     ????  PHYSICAL EXAM?? - limited by video  Vital signs: BP 157/86 Comment: home reading - Pulse 78  - Ht 154.9 cm (5' 1)  - Wt 74.8 kg (165 lb)  - BMI 31.18 kg/m?? Body mass index is 31.18 kg/m??.  Gen: Pleasant and cooperative. AOx4.?  HEENT: PERRLA, EOMI, facial symmetry   Neuro: Good comprehension/cognition. CN 2-12 intact.    Comprehensive Musculoskeletal Examination:????  ?? Jaw, neck without limited ROM.???? Reports some pain with posterior extension of the neck.  ?? Shoulders, elbows, wrists, hands, fingers: Left MCP slightly swollen but able to make a fist. No MCP, PIP, DIP tenderness per patient with self palpation. Bilateral wrists with slightly limited flexion, no obvious swelling or pain. Elbows and shoulders wnl.  ?? No pain with standing in hips or knees. She denies swelling in knees and feet  Skin: No active rash but reported a recent rash now resolved.??       ????GENERAL SUMMARY AND IMPRESSION: ????  ????  In summary, the patient is a 63 y.o. female who initially presented with PMR like symptoms but eventually diagnosed with RA (+CCP, -RF) without external rheumatologist transferring care to [facility] due to insurance issues. Just tapered prednisone to 5 mg daily and currently on Otrexup 15 mg [place] qweek. History and exam still with some disease activity. Recommending increasing Otrexup 20 mg [place] qweek and not tapering prednisone further but continue on 5 mg daily. Will need labs and these entered to be completed at [facility] in next month. Will need facilitation of the Otrexup. Follow-up in 3 months with Carlus Pavlov [facility] and 6 months with myself. Work letter provided for her due to COVID19.    ????   Diagnosis ICD-10-CM Associated Orders   1. Rheumatoid arthritis of multiple sites with negative rheumatoid factor (CMS-HCC) M06.09 predniSONE (DELTASONE) 5 MG tablet     methotrexate, PF, (OTREXUP, PF,) 20 mg/0.4 mL AtIn     Rheumatoid Factor, Quantitative     Cyclic Citrul Peptide Antibody, IgG     CBC w/ Differential     Comprehensive Metabolic Panel     CRP  C-Reactive Protein     ESR Sed rate     Hepatitis B Surface Antibody     Hepatitis B Surface Antigen     Hepatitis B Core Antibody, total     Hepatitis C Antibody     Quantiferon TB Gold Plus     I spent 45 minutes on the audio/video with the patient via Doximity Video. Patient could not connect via Amwell. I spent an additional 20 minutes on pre- and post-visit activities.     The patient was physically located in [place] or a state in which I am permitted to provide care. The patient and/or parent/gauardian understood that s/he may incur co-pays and cost sharing, and agreed to the telemedicine visit. The visit was completed via phone and/or video, which was appropriate and reasonable under the circumstances given the patient's presentation at the time.    The patient and/or parent/guardian has been advised of the potential risks and limitations of this mode of treatment (including, but not limited to, the absence of in-person examination) and  has agreed to be treated using telemedicine. The patient's/patient's family's questions regarding telemedicine have been answered.     If the phone/video visit was completed in an ambulatory setting, the patient and/or parent/guardian has also been advised to contact their provider???s office for worsening conditions, and seek emergency medical treatment and/or call 911 if the patient deems either necessary.      Toy Samarin C. Scarlette Calico, MD, PhD  Assistant Professor of Medicine  Department of Medicine/Division of Rheumatology  [facility]  2:05 PM

## 2018-10-18 NOTE — Unmapped (Signed)
[facility] Specialty Medication Referral: Financial Assistance Approved AND [place] APPROVED    Medication (Brand/Generic):     Final Test Claim completed with resulted information below:    Patient ABLE to fill at [facility]  Insurance Company:  [employer]  Anticipated Copay: $0  Is anticipated copay with a copay card or grant? Yes, there was a copay card approved for this patient.     Does patient's insurance plan only allow a 15 day supply for the first 6 fills in the [facility]? NO  If yes, inform patient they can request to dis-enroll from the [facility] by calling the patient help desk at NOT APPLICABLE.      If the copay is under the $25 defined limit, per policy there will be no further investigation of need for financial assistance at this time unless patient requests. This referral has been communicated to the provider and handed off to the [facility] team for further processing and filling of prescribed medication.   ______________________________________________________________________  Please utilize this referral for viewing purposes as it will serve as the central location for all relevant documentation and updates.

## 2018-10-19 NOTE — Unmapped (Addendum)
[facility]   Patient Onboarding/Medication Counseling    Madison Harris is a 63 y.o. female with rheumatoid arthritis who I am counseling today on continuation of therapy.  I am speaking to the patient.    Verified patient's date of birth / HIPAA.    Specialty medication(s) to be sent: Inflammatory Disorders: Otrexup      Non-specialty medications/supplies to be sent: n/a (pt declined sharps container)       Otrexup (methotrexate injection)    Medication & Administration     Dosage: Inject the contents of one auto-injector (20mg ) under the skin every 7 days    Administration:     Auto-injector pen  1. Gather all supplies needed for injection on a clean, flat working surface: medication pen, alcohol swabs, sharps container, etc.  2. Look at the medication label ??? look for correct medication, correct dose, and check the expiration date  3. Look at the medication ??? the liquid in the viewing window of the pen device should appear clear and yellow in color  4. Select injection site ??? you can use the front of your thigh or your belly (but not the area 2 inches around your belly button)  5. Prepare injection site ??? wash your hands and clean the skin at the injection site with an alcohol swab and let it air dry, do not touch the injection site again before the injection  6. Twist off the safety cap marked with the number 1, this will break the seal; then remove the safety clip marked with the number 2; the pen is now ready to use  7. Place the needle end against your cleaned injection site at a 90 degree angle; firmly push the device against the injection site until fully depressed to start the injection ??? you will hear a click; hold pen in place for 3 seconds  8. After 3 full seconds, verify the viewing window now shows a red flag to indicate the dose is complete, then pull the pen away from your injection site  9. Dispose of the used auto-injector immediately in your sharps disposal container  10. If you see any blood at the injection site, press a cotton ball or gauze on the site and maintain pressure until the bleeding stops, do not rub the injection site      Adherence/Missed dose instructions:  If your injection is given more than 2 days after your scheduled injection date ??? consult your pharmacist for additional instructions on how to adjust your dosing schedule.    Goals of Therapy     ? Achieve symptom remission  ? Slow disease progression  ? Protection of remaining articular structures  ? Maintenance of function  ? Maintenance of effective psychosocial functioning    Side Effects & Monitoring Parameters     ? Upset stomach, diarrhea, nausea or throwing up  ? Feeling dizzy, tired, or weak  ? Hair thinning (reversible)  ? Minor cold-like symptoms  ? Photosensitivity ??? use sunscreen and avoid prolonged exposure    The following side effects should be reported to the provider:  ? Signs of a hypersensitivity reaction ??? rash; hives; itching; red, swollen, blistered, or peeling skin; wheezing; tightness in the chest or throat; difficulty breathing, swallowing, or talking; swelling of the mouth, face, lips, tongue, or throat; etc.  ? Reduced immune function ??? report signs of infection such as fever; chills; body aches; very bad sore throat; ear or sinus pain; cough; more sputum or  change in color of sputum; pain with passing urine; wound that will not heal, etc.  Also at a slightly higher risk of some malignancies (mainly skin and blood cancers) due to this reduced immune function.  o In the case of signs of infection ??? the patient should hold the next dose of Otrexup?? and call your primary care provider to ensure adequate medical care.  Treatment may be resumed when infection is treated and patient is asymptomatic.  ? Signs of bleeding ??? throwing up or coughing up blood; blood in the urine; black, red, or tarry stool; abnormal vaginal bleeding; bruises without a cause or that get bigger  ? Signs of pancreatitis ??? sudden very bad stomach pain, unexplained vomiting  ? Signs of kidney dysfunction ??? unable to pass urine; blood in the urine; sudden weight gain  ? Signs of liver dysfunction ??? darkened urine; upset stomach; light-colored stool; yellow skin or eyes  ? Signs of nerve problems ??? burning; numbness; tingling  ? Pinpoint red spots on the skin  ? Changes in eyesight      Contraindications, Warnings, & Precautions     ? Have your bloodwork checked as you have been told by your prescriber  ? Talk with your doctor if you are pregnant, planning to become pregnant, or breastfeeding ??? women taking this medication must use birth control while taking this drug and for some time after the last dose  ? Discuss the possible need for holding your dose(s) of methotrexate when a planned procedure is scheduled with the prescriber as it may delay healing/recovery timeline       Drug/Food Interactions     ? Medication list reviewed in Epic. The patient was instructed to inform the care team before taking any new medications or supplements. No drug interactions identified.     Storage, Handling Precautions, & Disposal     ? Store intact pens at room temperature  ? Protect from light  ? Dispose of used pens in a sharps disposal container    Current Medications (including OTC/herbals), Comorbidities and Allergies     Current Outpatient Medications   Medication Sig Dispense Refill   ??? aspirin (ECOTRIN) 81 MG tablet Take 81 mg by mouth.     ??? blood sugar diagnostic Strp by Other route daily. 200 each 2   ??? blood-glucose meter kit Check blood sugar daily. 1 each 0   ??? canagliflozin (INVOKANA) 300 mg Tab tablet Take 0.5 tablets (150 mg total) by mouth daily for 14 days, THEN 1 tablet (300 mg total) daily. 90 tablet 3   ??? evolocumab 140 mg/mL PnIj Inject 1 pen under the skin.     ??? folic acid (FOLVITE) 1 MG tablet Take 1 mg by mouth.     ??? insulin ASPART (NOVOLOG FLEXPEN U-100 INSULIN) 100 unit/mL (3 mL) injection pen INJECT 15 TO 21 UNITS UNDER THE SKIN THREE TIMES DAILY WITH MEALS     ??? isosorbide mononitrate (IMDUR) 30 MG 24 hr tablet Take 30 mg by mouth daily.     ??? lancets (FREESTYLE) 28 gauge Misc USE AS INSTRUCTED 200 each 2   ??? lisinopril (PRINIVIL,ZESTRIL) 40 MG tablet Take 1 tablet (40 mg total) by mouth daily. 90 tablet 3   ??? metFORMIN (GLUCOPHAGE-XR) 500 MG 24 hr tablet Take 2 tablets (1,000 mg total) by mouth every morning AND 1 tablet (500 mg total) nightly. 180 tablet 3   ??? methotrexate, PF, (OTREXUP, PF,) 20 mg/0.4 mL AtIn Inject 20 mg  under the skin every seven (7) days. 4.8 mL 3   ??? metoprolol succinate (TOPROL-XL) 50 MG 24 hr tablet Take 1 tablet (50 mg total) by mouth daily.     ??? nitroglycerin (NITROSTAT) 0.4 MG SL tablet PLACE 1 TABLET UNDER THE TONGUE EVERY 5 MINUTES AS NEEDED FOR CHEST PAIN UP TO 3 DOSES THEN CALL 911 IF NO RELIEF     ??? predniSONE (DELTASONE) 5 MG tablet Take 1 tablet (5 mg total) by mouth daily. 90 tablet 3   ??? venlafaxine (EFFEXOR-XR) 75 MG 24 hr capsule Take 75 mg by mouth.       No current facility-administered medications for this visit.        Allergies   Allergen Reactions   ??? Pravastatin Muscle Pain     Other Reaction: myalgia       Patient Active Problem List   Diagnosis   ??? Personal history of noncompliance with medical treatment, presenting hazards to health   ??? T2DM, uncontrolled, on insulin   ??? CAD s/p PCI   ??? Essential hypertension   ??? Mixed hyperlipidemia       Reviewed and up to date in Epic.    Appropriateness of Therapy     Is medication and dose appropriate based on diagnosis? Yes    Baseline Quality of Life Assessment      How many days over the past month did your rheumatoid arthritis keep you from your normal activities? 0    Financial Information     Medication Assistance provided: Prior Authorization and Copay Assistance    Anticipated copay of $0.00 reviewed with patient. Verified delivery address.    Delivery Information     Scheduled delivery date: 10/24/18    Expected start date: 10/29/18    Medication will be delivered via UPS to the home address in [facility].  This shipment will not require a signature.      Explained the services we provide at [facility] and that each month we would call to set up refills.  Stressed importance of returning phone calls so that we could ensure they receive their medications in time each month.  Informed patient that we should be setting up refills 7-10 days prior to when they will run out of medication.  A pharmacist will reach out to perform a clinical assessment periodically.  Informed patient that a welcome packet and a drug information handout will be sent.      Patient verbalized understanding of the above information as well as how to contact the pharmacy at [phone] option 4 with any questions/concerns.  The pharmacy is open Monday through Friday 8:30am-4:30pm.  A pharmacist is available 24/7 via pager to answer any clinical questions they may have.    Patient Specific Needs     ? Does the patient have any physical, cognitive, or cultural barriers? No    ? Patient prefers to have medications discussed with  Patient     ? Is the patient able to read and understand education materials at a high school level or above? Yes    ? Patient's primary language is  English     ? Is the patient high risk? No     ? Does the patient require a Care Management Plan? No     ? Does the patient require physician intervention or other additional services (i.e. nutrition, smoking cessation, social work)? No      Jaynie Collins, PharmD  [facility] Specialty Pharmacist

## 2018-10-23 ENCOUNTER — Encounter
Admit: 2018-10-23 | Discharge: 2018-10-24 | Payer: PRIVATE HEALTH INSURANCE | Attending: Internal Medicine | Primary: Internal Medicine

## 2018-10-23 DIAGNOSIS — I2510 Atherosclerotic heart disease of native coronary artery without angina pectoris: Principal | ICD-10-CM

## 2018-10-23 DIAGNOSIS — Z794 Long term (current) use of insulin: Secondary | ICD-10-CM

## 2018-10-23 DIAGNOSIS — G4733 Obstructive sleep apnea (adult) (pediatric): Secondary | ICD-10-CM

## 2018-10-23 DIAGNOSIS — I10 Essential (primary) hypertension: Secondary | ICD-10-CM

## 2018-10-23 DIAGNOSIS — E1165 Type 2 diabetes mellitus with hyperglycemia: Secondary | ICD-10-CM

## 2018-10-23 DIAGNOSIS — E782 Mixed hyperlipidemia: Secondary | ICD-10-CM

## 2018-10-23 MED ORDER — CANAGLIFLOZIN 300 MG TABLET
ORAL_TABLET | Freq: Every day | ORAL | 3 refills | 0 days
Start: 2018-10-23 — End: ?

## 2018-10-23 MED FILL — OTREXUP (PF) 20 MG/0.4 ML SUBCUTANEOUS AUTO-INJECTOR: 84 days supply | Qty: 5 | Fill #0 | Status: AC

## 2018-10-23 NOTE — Unmapped (Signed)
Medications, Allergies, History, and Preferred pharmacy was reviewed with the patient, and corrections were made to their chart accordingly.   Travel Screening completed and patientrequire referral to Covid-19 helpline.       The Patient was able to check their vital signs, weight, and height at home. They are as follows:    Weight 165lb lost 25 pounds since seeing Dr. Tye [place] 134/74,No pain or acute issues.

## 2018-10-23 NOTE — Unmapped (Signed)
This visit is conducted via Programmer, applications.  -- CONVERTED TO PHONE VISIT DUE TO TECHNICAL ISSUES.    Patient is currently located in the state of [place].    I have identified myself to the patient and conveyed my credentials to [name]  I have explained the capabilities and limitations of telemedicine and the patient and myself both agree that it is appropriate for their current circumstances/symptoms.  In case we get disconnected, patient's phone number is [phone] (home) [phone] (work)   Patient has signed informed consent on file in medical record.  Is there someone else in the room? No.       Subjective:      Madison Harris is a 63 y.o. female who presents  for a video visit amidst COVID-19 outbreak.     On invokana 300mg  daily we started at last dose.  We also started Trulicity once weekly but it made her sick so she stopped it.  She has not used any Aspart since March 1 because BG have been good and has been somewhat low.  Has been as low as 65 in the AM.  Now, she is now on Metformin 1000mg  in the AM and 500 mg in the PM.  Avg BG has been 160.  Fasting BG for the past few days has been 140-160.  This morning it was 186 but she forgot to take meds.  She has lost 25lbs with diet and exercise. She is on Repatha for HLD.    PSG in 07/2018 showing severe OSA with AHI of 63.  She never got results of this.     Past Medical/Surgical History:     Past Medical History:   Diagnosis Date   ??? Diabetes mellitus (CMS-HCC)     Type 2    ??? Hyperlipidemia    ??? Hypertension      Past Surgical History:   Procedure Laterality Date   ??? HYSTERECTOMY       Family History:     Family History   Problem Relation Age of Onset   ??? Cancer Mother    ??? Cancer Brother    ??? Cancer Maternal Grandmother    ??? Diabetes Maternal Grandmother    ??? Heart disease Maternal Grandmother    ??? Hypertension Maternal Grandmother      Social History:     Social History     Socioeconomic History   ??? Marital status: None     Spouse name: None   ??? Number of children: None   ??? Years of education: None   ??? Highest education level: None   Occupational History   ??? Occupation: IT consultant   Social Needs   ??? Financial resource strain: None   ??? Food insecurity     Worry: None     Inability: None   ??? Transportation needs     Medical: None     Non-medical: None   Tobacco Use   ??? Smoking status: Never Smoker   ??? Smokeless tobacco: Never Used   Substance and Sexual Activity   ??? Alcohol use: Yes   ??? Drug use: Never   ??? Sexual activity: None   Lifestyle   ??? Physical activity     Days per week: None     Minutes per session: None   ??? Stress: None   Relationships   ??? Social Wellsite geologist on phone: None     Gets together: None  Attends religious service: None     Active member of club or organization: None     Attends meetings of clubs or organizations: None     Relationship status: None   Other Topics Concern   ??? None   Social History Narrative   ??? None       Allergies:     Pravastatin    Current Medications:     Current Outpatient Medications   Medication Sig Dispense Refill   ??? aspirin (ECOTRIN) 81 MG tablet Take 81 mg by mouth.     ??? blood sugar diagnostic Strp by Other route daily. 200 each 2   ??? blood-glucose meter kit Check blood sugar daily. 1 each 0   ??? canagliflozin (INVOKANA) 300 mg Tab tablet Take 1 tablet (300 mg total) by mouth daily. 90 tablet 3   ??? evolocumab 140 mg/mL PnIj Inject 1 pen under the skin.     ??? folic acid (FOLVITE) 1 MG tablet Take 1 mg by mouth.     ??? isosorbide mononitrate (IMDUR) 30 MG 24 hr tablet Take 30 mg by mouth daily.     ??? lancets (FREESTYLE) 28 gauge Misc USE AS INSTRUCTED 200 each 2   ??? lisinopril (PRINIVIL,ZESTRIL) 40 MG tablet Take 1 tablet (40 mg total) by mouth daily. 90 tablet 3   ??? metFORMIN (GLUCOPHAGE-XR) 500 MG 24 hr tablet Take 2 tablets (1,000 mg total) by mouth every morning AND 1 tablet (500 mg total) nightly. 180 tablet 3   ??? methotrexate, PF, (OTREXUP, PF,) 20 mg/0.4 mL AtIn Inject the contents of 1 pen (20 mg) under the skin every seven (7) days. 4.8 mL 3   ??? metoprolol succinate (TOPROL-XL) 50 MG 24 hr tablet Take 1 tablet (50 mg total) by mouth daily.     ??? nitroglycerin (NITROSTAT) 0.4 MG SL tablet PLACE 1 TABLET UNDER THE TONGUE EVERY 5 MINUTES AS NEEDED FOR CHEST PAIN UP TO 3 DOSES THEN CALL 911 IF NO RELIEF     ??? predniSONE (DELTASONE) 5 MG tablet Take 1 tablet (5 mg total) by mouth daily. 90 tablet 3   ??? venlafaxine (EFFEXOR-XR) 75 MG 24 hr capsule Take 75 mg by mouth.       No current facility-administered medications for this visit.        ROS   ROS negative unless otherwise noted in HPI.    Objective:     Vitals:    10/23/18 1437   BP: 134/74     No exam was performed during this telephone exam.    Assessment and Plan:     Problem List Items Addressed This Visit        Cardiovascular and Mediastinum    CAD s/p PCI - Primary (Chronic)    Essential hypertension (Chronic)       Other    T2DM, uncontrolled, on insulin (Chronic)    Relevant Orders    Hemoglobin A1c    Mixed hyperlipidemia (Chronic)      Other Visit Diagnoses     OSA (obstructive sleep apnea)        Relevant Orders    CPAP Therapy and Supplies          T2DM  CAD  HTN  She is actually doing incredibly well from a metabolic standpoint.  She is very surprisingly off of insulin altogether though her blood sugars are slightly higher than I would like to have them in the 140???160 range fasting.  I suspect her  A1c has not improved all that much from 8.4% since she stopped her large dose of insulin altogether when she started her Invokana.  She did not tolerate GLP-1 agonist.  I think for the time being I will check her A1c in a few weeks when she has labs scheduled through rheumatology and we could increase her metformin to 1 g twice daily.  It may be worth retrying a very low-dose GLP-1 agonist daily in the future.  Her blood pressure is well controlled currently.  She denies any cardiac symptoms and has been exercising very regularly.  We will treat OSA as below.    OSA  Reviewed PSG.  Sent order for AutoPap to [facility].  Will set up for CPAP compliance visit in about 3 months.    Orders Placed This Encounter   Procedures   ??? CPAP Therapy and Supplies   ??? Hemoglobin A1c     Requested Prescriptions     Signed Prescriptions Disp Refills   ??? canagliflozin (INVOKANA) 300 mg Tab tablet 90 tablet 3     Sig: Take 1 tablet (300 mg total) by mouth daily.           RETURN TO CARE:  No follow-ups on file.    Visit conducted via Telephone.  28 minutes spent on phone and additional 7 mintues spent on pre-post call activities.    Total time spent with patient: 43

## 2018-11-14 ENCOUNTER — Encounter: Admit: 2018-11-14 | Discharge: 2018-11-15 | Payer: PRIVATE HEALTH INSURANCE

## 2018-11-14 DIAGNOSIS — E1165 Type 2 diabetes mellitus with hyperglycemia: Secondary | ICD-10-CM

## 2018-11-14 DIAGNOSIS — Z794 Long term (current) use of insulin: Secondary | ICD-10-CM

## 2018-11-14 DIAGNOSIS — M0609 Rheumatoid arthritis without rheumatoid factor, multiple sites: Principal | ICD-10-CM

## 2018-11-14 LAB — COMPREHENSIVE METABOLIC PANEL
ALBUMIN: 4 g/dL (ref 3.5–5.0)
ALKALINE PHOSPHATASE: 72 U/L (ref 38–126)
ALT (SGPT): 17 U/L (ref <35)
ANION GAP: 8 mmol/L (ref 7–15)
AST (SGOT): 19 U/L (ref 14–38)
BILIRUBIN TOTAL: 0.4 mg/dL (ref 0.0–1.2)
BLOOD UREA NITROGEN: 17 mg/dL (ref 7–21)
BUN / CREAT RATIO: 24
CALCIUM: 9.5 mg/dL (ref 8.5–10.2)
CHLORIDE: 108 mmol/L — ABNORMAL HIGH (ref 98–107)
CO2: 26 mmol/L (ref 22.0–30.0)
CREATININE: 0.71 mg/dL (ref 0.60–1.00)
EGFR CKD-EPI AA FEMALE: >90 mL/min/{1.73_m2} (ref >=60)
EGFR CKD-EPI NON-AA FEMALE: >90 mL/min/{1.73_m2} (ref >=60)
POTASSIUM: 4.9 mmol/L (ref 3.5–5.0)
PROTEIN TOTAL: 6.4 g/dL — ABNORMAL LOW (ref 6.5–8.3)
SODIUM: 142 mmol/L (ref 135–145)

## 2018-11-14 LAB — BLOOD UREA NITROGEN: Urea nitrogen:MCnc:Pt:Ser/Plas:Qn:: 17

## 2018-11-14 LAB — RED BLOOD CELL COUNT: Lab: 4.13

## 2018-11-14 LAB — HEPATITIS B SURFACE ANTIBODY: HEPATITIS B SURFACE ANTIBODY QUANT: <8 m[IU]/mL (ref <8.00)

## 2018-11-14 LAB — CBC W/ AUTO DIFF
BASOPHILS ABSOLUTE COUNT: 0 10*9/L (ref 0.0–0.1)
BASOPHILS RELATIVE PERCENT: 0.5 %
EOSINOPHILS ABSOLUTE COUNT: 0.1 10*9/L (ref 0.0–0.4)
EOSINOPHILS RELATIVE PERCENT: 1.8 %
HEMATOCRIT: 42.6 % (ref 36.0–46.0)
HEMOGLOBIN: 13.8 g/dL (ref 13.5–16.0)
LARGE UNSTAINED CELLS: 1 % (ref 0–4)
LYMPHOCYTES RELATIVE PERCENT: 22.4 %
MEAN CORPUSCULAR HEMOGLOBIN CONC: 32.4 g/dL (ref 31.0–37.0)
MEAN CORPUSCULAR HEMOGLOBIN: 33.4 pg (ref 26.0–34.0)
MEAN CORPUSCULAR VOLUME: 103.1 fL — ABNORMAL HIGH (ref 80.0–100.0)
MONOCYTES ABSOLUTE COUNT: 0.2 10*9/L (ref 0.2–0.8)
NEUTROPHILS ABSOLUTE COUNT: 4.1 10*9/L (ref 2.0–7.5)
NEUTROPHILS RELATIVE PERCENT: 70.5 %
PLATELET COUNT: 298 10*9/L (ref 150–440)
RED BLOOD CELL COUNT: 4.13 10*12/L (ref 4.00–5.20)
RED CELL DISTRIBUTION WIDTH: 14.8 % (ref 12.0–15.0)
WBC ADJUSTED: 5.8 10*9/L (ref 4.5–11.0)

## 2018-11-14 LAB — HEPATITIS B CORE TOTAL ANTIBODY: Hepatitis B virus core Ab:PrThr:Pt:Ser/Plas:Ord:IA: NONREACTIVE

## 2018-11-14 LAB — ESTIMATED AVERAGE GLUCOSE: Estimated average glucose:MCnc:Pt:Bld:Qn:Estimated from glycated hemoglobin: 180

## 2018-11-14 LAB — HEPATITIS B SURFACE ANTIGEN: Hepatitis B virus surface Ag:PrThr:Pt:Ser:Ord:: NONREACTIVE

## 2018-11-14 LAB — ERYTHROCYTE SEDIMENTATION RATE: Lab: 21

## 2018-11-14 LAB — HEPATITIS B SURFACE ANTIBODY QUANT: Hepatitis B virus surface Ab:ACnc:Pt:Ser:Qn:: <8

## 2018-11-14 LAB — C-REACTIVE PROTEIN: C reactive protein:MCnc:Pt:Ser/Plas:Qn:: 17.8 — ABNORMAL HIGH

## 2018-11-14 LAB — RHEUMATOID FACTOR: Rheumatoid factor:ACnc:Pt:Ser/Plas:Qn:: <8.6

## 2018-11-14 LAB — HEPATITIS C ANTIBODY: Hepatitis C virus Ab:PrThr:Pt:Ser:Ord:: NONREACTIVE

## 2018-11-15 LAB — QUANTIFERON TB GOLD PLUS
QUANTIFERON ANTIGEN 2 MINUS NIL: 0.01 [IU]/mL
QUANTIFERON TB GOLD PLUS: NEGATIVE
QUANTIFERON TB NIL VALUE: 0.02 [IU]/mL

## 2018-11-15 LAB — QUANTIFERON TB NIL VALUE: Lab: 0.02

## 2018-11-15 LAB — TB NIL VALUE: Lab: 0.02

## 2018-11-15 LAB — TB AG2 VALUE: Lab: 0.03

## 2018-11-15 LAB — TB AG1 VALUE: Lab: 0.04

## 2018-11-15 LAB — TB MITOGEN VALUE: Lab: 2.94

## 2018-11-15 LAB — CCP ANTIBODIES: Lab: <1

## 2018-11-20 ENCOUNTER — Telehealth: Payer: Self-pay

## 2018-11-20 NOTE — Telephone Encounter (Signed)
Called and spoke to pt.  She said she needs to switch gI doctors b/c she has new insurance and our practice is not in network

## 2018-11-23 MED ORDER — PREDNISONE 1 MG TABLET
ORAL_TABLET | 5 refills | 0 days | Status: CP
Start: 2018-11-23 — End: 2019-01-16

## 2018-11-23 NOTE — Unmapped (Signed)
Today, patient is reporting worsening pain. She is noting increased swelling in both wrists as well as side of her thumbs. Feels like there is knot there. She is currently on prednisone 5 mg daily and recently had Otrexup increased to 20 mg po qweek. She is noting pain and stiffness. Stiffness is in her hips. Takes here several minutes to get going but very painful. Pain is anterior. She is also complaining of pain in neck and shoulders.     Had decreased prednisone from 6 mg to 5 mg daily day of visit.     Discussed increasing prednisone to 7 mg daily.   She needs 1 mg tablets.     Have her take an extra 5 mg tablet today before starting 7 mg daily tomorrow. Advised her to reach on Monday and let me know progress.     She agrees to this plan.

## 2018-11-26 ENCOUNTER — Encounter: Payer: Self-pay | Admitting: Gastroenterology

## 2018-12-11 ENCOUNTER — Other Ambulatory Visit: Payer: Self-pay | Admitting: "Endocrinology

## 2018-12-20 MED ORDER — VENLAFAXINE ER 75 MG CAPSULE,EXTENDED RELEASE 24 HR
ORAL_CAPSULE | Freq: Every day | ORAL | 0 refills | 0 days | Status: CP
Start: 2018-12-20 — End: ?

## 2018-12-20 MED ORDER — METFORMIN ER 500 MG TABLET,EXTENDED RELEASE 24 HR
ORAL_TABLET | ORAL | 3 refills | 0 days | Status: CP
Start: 2018-12-20 — End: 2018-12-24

## 2018-12-20 MED ORDER — LISINOPRIL 40 MG TABLET
ORAL_TABLET | Freq: Every day | ORAL | 3 refills | 0.00000 days | Status: CP
Start: 2018-12-20 — End: ?

## 2018-12-21 ENCOUNTER — Other Ambulatory Visit: Payer: Self-pay | Admitting: Cardiovascular Disease

## 2018-12-24 MED ORDER — METFORMIN ER 500 MG TABLET,EXTENDED RELEASE 24 HR
ORAL_TABLET | ORAL | 0 refills | 0 days | Status: CP
Start: 2018-12-24 — End: ?

## 2018-12-25 ENCOUNTER — Encounter: Payer: Self-pay | Admitting: Pharmacist

## 2018-12-25 NOTE — Telephone Encounter (Signed)
This encounter was created in error - please disregard.

## 2018-12-26 NOTE — Unmapped (Signed)
[facility] Clinical Assessment & Refill Coordination Note    Madison Harris, DOB: 1955/11/30  Phone: [phone] (home) [phone] (work)    All above HIPAA information was verified with patient.     Specialty Medication(s):   Inflammatory Disorders: Otrexup     Current Outpatient Medications   Medication Sig Dispense Refill   ??? aspirin (ECOTRIN) 81 MG tablet Take 81 mg by mouth.     ??? blood sugar diagnostic Strp by Other route daily. 200 each 2   ??? blood-glucose meter kit Check blood sugar daily. 1 each 0   ??? canagliflozin (INVOKANA) 300 mg Tab tablet Take 1 tablet (300 mg total) by mouth daily. 90 tablet 3   ??? evolocumab 140 mg/mL PnIj Inject 1 pen under the skin.     ??? folic acid (FOLVITE) 1 MG tablet Take 1 mg by mouth.     ??? isosorbide mononitrate (IMDUR) 30 MG 24 hr tablet Take 30 mg by mouth daily.     ??? lancets (FREESTYLE) 28 gauge Misc USE AS INSTRUCTED 200 each 2   ??? lisinopriL (PRINIVIL,ZESTRIL) 40 MG tablet Take 1 tablet (40 mg total) by mouth daily. 90 tablet 3   ??? metFORMIN (GLUCOPHAGE-XR) 500 MG 24 hr tablet Take 2 tablets (1,000 mg total) by mouth every morning AND 1 tablet (500 mg total) nightly. 270 tablet 0   ??? methotrexate, PF, (OTREXUP, PF,) 20 mg/0.4 mL AtIn Inject the contents of 1 pen (20 mg) under the skin every seven (7) days. 4.8 mL 3   ??? metoprolol succinate (TOPROL-XL) 50 MG 24 hr tablet Take 1 tablet (50 mg total) by mouth daily.     ??? nitroglycerin (NITROSTAT) 0.4 MG SL tablet PLACE 1 TABLET UNDER THE TONGUE EVERY 5 MINUTES AS NEEDED FOR CHEST PAIN UP TO 3 DOSES THEN CALL 911 IF NO RELIEF     ??? predniSONE (DELTASONE) 1 MG tablet Take 7 mg daily and then taper as directed. Take two 1 mg tablets and one 5 mg tablet. 60 tablet 5   ??? predniSONE (DELTASONE) 5 MG tablet Take 1 tablet (5 mg total) by mouth daily. 90 tablet 3   ??? venlafaxine (EFFEXOR-XR) 75 MG 24 hr capsule Take 1 capsule (75 mg total) by mouth daily. 90 capsule 0     No current facility-administered medications for this visit.         Changes to medications: Madison Harris reports no changes at this time.    Allergies   Allergen Reactions   ??? Pravastatin Muscle Pain     Other Reaction: myalgia       Changes to allergies: No    SPECIALTY MEDICATION ADHERENCE     Otrexup 20mg /0.48ml: 14 days of medicine on hand     Medication Adherence    Patient reported X missed doses in the last month:  0  Specialty Medication:  Otrexup 20mg /0.71ml          Specialty medication(s) dose(s) confirmed: Regimen is correct and unchanged.     Are there any concerns with adherence? No    Adherence counseling provided? Not needed    CLINICAL MANAGEMENT AND INTERVENTION      Clinical Benefit Assessment:    Do you feel the medicine is effective or helping your condition? Yes    Clinical Benefit counseling provided? Not needed    Adverse Effects Assessment:    Are you experiencing any side effects? No    Are you experiencing difficulty administering  your medicine? No    Quality of Life Assessment:    How many days over the past month did your rheumatoid arthritis keep you from your normal activities? For example, brushing your teeth or getting up in the morning. Patient declined to answer    Have you discussed this with your provider? Not needed    Therapy Appropriateness:    Is therapy appropriate? Yes, therapy is appropriate and should be continued    DISEASE/MEDICATION-SPECIFIC INFORMATION      For patients on injectable medications: Patient currently has 2 doses left.  Next injection is scheduled for 12/31/2018.    PATIENT SPECIFIC NEEDS     ? Does the patient have any physical, cognitive, or cultural barriers? No    ? Is the patient high risk? No     ? Does the patient require a Care Management Plan? No     ? Does the patient require physician intervention or other additional services (i.e. nutrition, smoking cessation, social work)? No      SHIPPING     Specialty Medication(s) to be Shipped:   Inflammatory Disorders: Otrexup 20mg /0.20ml    Other medication(s) to be shipped: none       Changes to insurance: No    Delivery Scheduled: Yes, Expected medication delivery date: 01/09/2019.     Medication will be delivered via UPS to the confirmed home address in [place].    The patient will receive a drug information handout for each medication shipped and additional FDA Medication Guides as required.  Verified that patient has previously received a Conservation officer, historic buildings.    All of the patient's questions and concerns have been addressed.    [name]   [facility] Specialty Pharmacist

## 2019-01-08 MED FILL — OTREXUP (PF) 20 MG/0.4 ML SUBCUTANEOUS AUTO-INJECTOR: 84 days supply | Qty: 5 | Fill #1 | Status: AC

## 2019-01-08 MED FILL — OTREXUP (PF) 20 MG/0.4 ML SUBCUTANEOUS AUTO-INJECTOR: SUBCUTANEOUS | 84 days supply | Qty: 4.8 | Fill #1

## 2019-01-16 ENCOUNTER — Encounter: Admit: 2019-01-16 | Discharge: 2019-01-17 | Payer: PRIVATE HEALTH INSURANCE

## 2019-01-16 DIAGNOSIS — M353 Polymyalgia rheumatica: Principal | ICD-10-CM

## 2019-01-16 DIAGNOSIS — Z79899 Other long term (current) drug therapy: Secondary | ICD-10-CM

## 2019-01-16 MED ORDER — PREDNISONE 1 MG TABLET
ORAL_TABLET | 3 refills | 0 days | Status: CP
Start: 2019-01-16 — End: ?

## 2019-01-16 NOTE — Unmapped (Signed)
I spent 25 minutes on the real-time audio and video with the patient. I spent an additional 10 minutes on pre- and post-visit activities.     The patient was physically located in [place] or a state in which I am permitted to provide care. The patient and/or parent/guardian understood that s/he may incur co-pays and cost sharing, and agreed to the telemedicine visit. The visit was reasonable and appropriate under the circumstances given the patient's presentation at the time.    The patient and/or parent/guardian has been advised of the potential risks and limitations of this mode of treatment (including, but not limited to, the absence of in-person examination) and has agreed to be treated using telemedicine. The patient's/patient's family's questions regarding telemedicine have been answered.     If the visit was completed in an ambulatory setting, the patient and/or parent/guardian has also been advised to contact their provider???s office for worsening conditions, and seek emergency medical treatment and/or call 911 if the patient deems either necessary.    REASON FOR VISIT: f/u RA     Identification: Pt self identified using name and date of birth  Patient location:   The limitations of this telemedicine encounter were discussed with patient. Both the patient and myself agreed to this encounter despite these limitations. Benefits of this telemedicine encounter included allowing for continued care of patient and minimizing risk of exposure to COVID-19.       HISTORY: Madison Harris is a 63 y.o. 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  Initially diagnosed with PMR by outside rheumatologist, severe stiffness in hips and neck. 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 Started SQ mtx (otrexup) in 04/2018. Transferred care to our clinic in 09/2018 due to insurance. Mtx increased to 20 mg at that visit. Repeat RF and CCP were negative. Suspect that underlying diagnosis is PMR.     Interim history:  Presents today for f/u.     She contacted clinic in June complaining of severe worsening pain, and prednisone was increased to 7 mg. She notes that this increase was very helpful and she feels fine now. She states that she has to have the prednisone, however. If she is even a few hours late in taking her morning dose, she can tell because she has returning pain.    I tried to discuss whether she thinks the otrexup has been helpful for her symptoms. She feels that she could see a difference in symptoms when she initially started this medication, and does feel the increase to 20 mg was helpful as well. She has missed a couple of doses of otrexup due to getting this confused with another shot she is taking, and when she misses a dose, she feels an increase in pain.      She notes stiffness every morning, lasts only a few minutes. Also stiff after sitting for 25 min or more. Stiffness is always in the hips.    Has seen swelling in R hand, R wrist, swollen places like knots in her arm R>l that are nontender      Reports her blood sugars have been terrible since she increased the prednisone to 7 mg,  FBS most mornings over 200. Her blood sugar was much better on the 5 mg prednisone.     We talked about starting to taper prednisone, and she states that she cannot get off the prednisone or she will have severe returning pain. Every time  she tries to taper this, she has more pain, and doesn't think she will be able to tolerate tapering. I asked her if she wants to remain at the 7 mg prednisone dose, but she doesn't want to do that either. She ultimately agrees to trying to taper prednisone.         CURRENT MEDICATIONS:  Current Outpatient Medications   Medication Sig Dispense Refill   ??? aspirin (ECOTRIN) 81 MG tablet Take 81 mg by mouth.     ??? blood sugar diagnostic Strp by Other route daily. 200 each 2   ??? blood-glucose meter kit Check blood sugar daily. 1 each 0   ??? canagliflozin (INVOKANA) 300 mg Tab tablet Take 1 tablet (300 mg total) by mouth daily. 90 tablet 3   ??? ergocalciferol, vitamin D2, (VITAMIN D2 ORAL) Take by mouth.     ??? evolocumab 140 mg/mL PnIj Inject 1 pen under the skin.     ??? folic acid (FOLVITE) 1 MG tablet Take 1 mg by mouth.     ??? isosorbide mononitrate (IMDUR) 30 MG 24 hr tablet Take 30 mg by mouth daily.     ??? lancets (FREESTYLE) 28 gauge Misc USE AS INSTRUCTED 200 each 2   ??? lisinopriL (PRINIVIL,ZESTRIL) 40 MG tablet Take 1 tablet (40 mg total) by mouth daily. 90 tablet 3   ??? metFORMIN (GLUCOPHAGE-XR) 500 MG 24 hr tablet Take 2 tablets (1,000 mg total) by mouth every morning AND 1 tablet (500 mg total) nightly. 270 tablet 0   ??? methotrexate, PF, (OTREXUP, PF,) 20 mg/0.4 mL AtIn Inject the contents of 1 pen (20 mg) under the skin every seven (7) days. 4.8 mL 3   ??? metoprolol succinate (TOPROL-XL) 50 MG 24 hr tablet Take 1 tablet (50 mg total) by mouth daily.     ??? nitroglycerin (NITROSTAT) 0.4 MG SL tablet PLACE 1 TABLET UNDER THE TONGUE EVERY 5 MINUTES AS NEEDED FOR CHEST PAIN UP TO 3 DOSES THEN CALL 911 IF NO RELIEF     ??? predniSONE (DELTASONE) 1 MG tablet Take 7 mg daily and then taper as directed. Take two 1 mg tablets and one 5 mg tablet. 60 tablet 5   ??? venlafaxine (EFFEXOR-XR) 75 MG 24 hr capsule Take 1 capsule (75 mg total) by mouth daily. 90 capsule 0     No current facility-administered medications for this visit.        Past Medical History:   Diagnosis Date   ??? Diabetes mellitus (CMS-HCC)     Type 2    ??? Hyperlipidemia    ??? Hypertension         Record Review: Available records were reviewed, including pertinent office visits, labs, and imaging.      REVIEW OF SYSTEMS: Ten system were reviewed and negative except as noted above.    PHYSICAL EXAM:  VITAL SIGNS:   Vitals:    01/15/19 1526   Weight: 74.8 kg (165 lb)     General:   Pleasant 63 y.o.female in no acute distress, WDWN   Lungs:  Normal respiratory effort, no coughing or wheezing.    Musculoskeletal:   Hands: No overt swelling of hands. Tenderness of R MCP 3-4 and PIP 5. Able to make a tight fist b/l.   Wrists:FROM w/o swelling b/l. Painful ROM of the L  Elbows: FROM w/o swelling  Shoulders: FROM of the R w/ pain, mildly reduced ROM of the L with pain   Psych:  Appropriate affect and mood  Skin:  No rashes.       ASSESSMENT/PLAN:  1. PMR (polymyalgia rheumatica) (CMS-HCC) vs RA  Discussed that repeat lab eval most consistent with PMR. The dx of PMR was discussed as well as prognosis, which is good with treatment.   I would recommend tapering prednisone by 1 mg qmo. She is willing to try taper. Will taper to 6 mg in August, 5 mg in Sept, 4 mg in October, 3 mg in November. Continue otrexup 20 mg qwk and FA 1 mg qd.   Check labs below.   - predniSONE (DELTASONE) 1 MG tablet; Take 1-4 pills daily as directed for taper  Dispense: 360 tablet; Refill: 3  - Sedimentation Rate; Future  - C-reactive protein; Future    2. Methotrexate, long term, current use  Checking labs below to evaluate for medication toxicity.    - Albumin; Future  - AST; Future  - ALT; Future  - CBC w/ Differential; Future  - Creatinine; Future    RTC 4 mo with Dr Scarlette Calico

## 2019-01-16 NOTE — Unmapped (Addendum)
Tapering prednisone:  Take 6 mg for August  Take 5 mg for September  Take 4 mg for October  Take 3 mg for November

## 2019-01-23 ENCOUNTER — Ambulatory Visit
Admit: 2019-01-23 | Discharge: 2019-01-24 | Payer: PRIVATE HEALTH INSURANCE | Attending: Internal Medicine | Primary: Internal Medicine

## 2019-01-23 DIAGNOSIS — G4733 Obstructive sleep apnea (adult) (pediatric): Secondary | ICD-10-CM

## 2019-01-23 DIAGNOSIS — I2510 Atherosclerotic heart disease of native coronary artery without angina pectoris: Secondary | ICD-10-CM

## 2019-01-23 DIAGNOSIS — Z9989 Dependence on other enabling machines and devices: Secondary | ICD-10-CM

## 2019-01-23 DIAGNOSIS — I10 Essential (primary) hypertension: Secondary | ICD-10-CM

## 2019-01-23 DIAGNOSIS — Z7952 Long term (current) use of systemic steroids: Secondary | ICD-10-CM

## 2019-01-23 DIAGNOSIS — E782 Mixed hyperlipidemia: Secondary | ICD-10-CM

## 2019-01-23 DIAGNOSIS — Z794 Long term (current) use of insulin: Secondary | ICD-10-CM

## 2019-01-23 DIAGNOSIS — M353 Polymyalgia rheumatica: Secondary | ICD-10-CM

## 2019-01-23 DIAGNOSIS — Z79899 Other long term (current) drug therapy: Secondary | ICD-10-CM

## 2019-01-23 DIAGNOSIS — D7589 Other specified diseases of blood and blood-forming organs: Secondary | ICD-10-CM

## 2019-01-23 DIAGNOSIS — E1165 Type 2 diabetes mellitus with hyperglycemia: Principal | ICD-10-CM

## 2019-01-23 DIAGNOSIS — R413 Other amnesia: Secondary | ICD-10-CM

## 2019-01-23 LAB — CBC W/ AUTO DIFF
BASOPHILS ABSOLUTE COUNT: 0 10*9/L (ref 0.0–0.1)
BASOPHILS RELATIVE PERCENT: 0.4 %
EOSINOPHILS ABSOLUTE COUNT: 0 10*9/L (ref 0.0–0.4)
EOSINOPHILS RELATIVE PERCENT: 0.7 %
HEMATOCRIT: 46.7 % — ABNORMAL HIGH (ref 36.0–46.0)
HEMOGLOBIN: 14.7 g/dL (ref 12.0–16.0)
LARGE UNSTAINED CELLS: 1 % (ref 0–4)
MEAN CORPUSCULAR HEMOGLOBIN CONC: 31.4 g/dL (ref 31.0–37.0)
MEAN CORPUSCULAR HEMOGLOBIN: 33.1 pg (ref 26.0–34.0)
MEAN CORPUSCULAR VOLUME: 105.3 fL — ABNORMAL HIGH (ref 80.0–100.0)
MEAN PLATELET VOLUME: 9.1 fL (ref 7.0–10.0)
MONOCYTES ABSOLUTE COUNT: 0.2 10*9/L (ref 0.2–0.8)
NEUTROPHILS RELATIVE PERCENT: 73.4 %
PLATELET COUNT: 277 10*9/L (ref 150–440)
RED BLOOD CELL COUNT: 4.43 10*12/L (ref 4.00–5.20)
RED CELL DISTRIBUTION WIDTH: 14.4 % (ref 12.0–15.0)
WBC ADJUSTED: 5.8 10*9/L (ref 4.5–11.0)

## 2019-01-23 LAB — VITAMIN B-12: Cobalamins:MCnc:Pt:Ser/Plas:Qn:: 262

## 2019-01-23 LAB — C-REACTIVE PROTEIN: C reactive protein:MCnc:Pt:Ser/Plas:Qn:: 10.6 — ABNORMAL HIGH

## 2019-01-23 LAB — CREATININE
CREATININE: 0.8 mg/dL (ref 0.60–1.00)
EGFR CKD-EPI AA FEMALE: >90 mL/min/{1.73_m2} (ref >=60)

## 2019-01-23 LAB — ALT (SGPT): Alanine aminotransferase:CCnc:Pt:Ser/Plas:Qn:: 21

## 2019-01-23 LAB — ALBUMIN: Albumin:MCnc:Pt:Ser/Plas:Qn:: 4.1

## 2019-01-23 LAB — EGFR CKD-EPI AA FEMALE: Lab: >90

## 2019-01-23 LAB — AST (SGOT): Aspartate aminotransferase:CCnc:Pt:Ser/Plas:Qn:: 21

## 2019-01-23 LAB — SMEAR REVIEW

## 2019-01-23 LAB — WBC ADJUSTED: Lab: 5.8

## 2019-01-23 LAB — ERYTHROCYTE SEDIMENTATION RATE: Lab: 7

## 2019-01-23 MED ORDER — TOUJEO SOLOSTAR U-300 INSULIN 300 UNIT/ML (1.5 ML) SUBCUTANEOUS PEN
Freq: Every evening | SUBCUTANEOUS | 2 refills | 36.00000 days | Status: CP
Start: 2019-01-23 — End: ?

## 2019-01-23 NOTE — Unmapped (Signed)
==================================    Addendum:    CPAP compliance report received from [facility] and reviewed today.  She has 93% of days using 4 hours or more with use on 51 out of 54 days.  Average usage is 7 hours and 10 minutes.  On days used, her average usage is 7 hours and 36 minutes.    Pressure settings are 4???20.  Her median pressure is 8.5.  She has minimal leaks.  Her AHI is 4.0.  She has 0 minutes of Cheyne-Stokes respiration.

## 2019-01-23 NOTE — Unmapped (Signed)
Please buy Super B Complex from [employer] at take once per day.

## 2019-01-23 NOTE — Unmapped (Signed)
[facility] INTERNAL MEDICINE  [address] [place] Harris, Madison Harris  Ph: [phone], F: [phone]            ASSESSMENT & PLAN   Madison Harris was seen today for follow-up.    Diagnoses and all orders for this visit:    T2DM, uncontrolled, on insulin  ?? Glucose control: no  ?? BP control: yes  ?? Aspirin: yes  ?? Statin: no, PCSK9  ?? ACE/ARB: yes  ?? Peripheral Neuropathy: no  ?? Retinopathy: no  ?? Pneumovax: no  Plan:  ?? Restart Toujeo 25u nightly.  3x3 adjustment.  F/u in 2-3 months.  Check A1c today  -     insulin glargine U-300 conc (TOUJEO SOLOSTAR U-300 INSULIN) 300 unit/mL (1.5 mL) injection pen; Inject 0.083 mL (25 Units total) under the skin nightly.  -     Hemoglobin A1c; Future    Mixed hyperlipidemia  Controlled on Repatha.    Essential hypertension  Controlled on Lisinopril, Imdur, and Metoprolol.    CAD s/p PCI  Stable on appropriate medical therapy.    OSA on CPAP  I do not have her compliance report from [facility] so we will request that.  She seems like her symptoms of daytime fatigue and energy level are significantly improved.  Unfortunately her memory does not seem to be much improved (see below).    PMR (polymyalgia rheumatica)   Current chronic use of systemic steroids  Continue follow-up with rheumatology.  Try to wean steroids as much as tolerated.    Memory problem  Macrocytosis  She has seen Dr. Hoyle Barr who felt her symptoms did not represent a true dementia.  She did have a borderline low B12 level in the low 300s about 8 months ago so we will recheck that today given ongoing macrocytosis and on metformin.  I asked her to go ahead and start taking B12 supplements and we can see if she improves over the next 2 to 3 months of Pap therapy.  -     Vitamin B12; Future    Return in about 3 months (around 04/25/2019) for DM, HTN/HLD.    Future Appointments   Date Time Provider Department Center   02/07/2019  2:15 PM Princess Bruins, MD [facility]   05/29/2019  2:00 PM Rumey Sherie Don, MD [facility]       Over 40 mins spent in consultation and care coordination, with counseling occupying more than 50% of the visit today involving primary diagnosis secondary diagnoses listed above.      HPI     Chief Complaint   Patient presents with   ??? Follow-up     Madison Harris is a 62 y.o. female in today for follow-up of her chronic medical problems.  I last had a visit with her about 3 months ago via telemedicine.  She is now on Invokana max dose, metformin 1500 mg daily, lisinopril, metoprolol, and aspirin.  She denies chest pain, SOB, DOE, orthopnea, PND, pre-syncope or syncope.  She unfortunately has had to be on prednisone for her RA vs PMR (now felt to be more PMR than RA).  Since I last saw her she has not used any NovoLog but remains on her long-acting insulin.  She unfortunately did not tolerate Trulicity because it made her sick to her stomach.  Lab Results   Component Value Date    A1C 7.9 (H) 11/14/2018    A1C 8.4 (H) 07/25/2018  HGBA1C 9.2 (H) 05/22/2018   HGBA1C 8.4 (H) 01/25/2018   HGBA1C 8.8 (H) 10/24/2017     She does have CAD status post MI with 3 DES in 2012.  She denies any ongoing anginal symptoms.  She remains on a PCSK9 inhibitor with excellent lipid control.    She is also following up her obstructive sleep apnea.  She had a polysomnogram in February of this year showing severe OSA with an AHI of 63.  I ordered AutoPap for her which she reports wearing 100% of the time.  She does feel like she has much more energy during the day.  She is tolerating it well without any complaints.  She has not noticed any improvement in her memory and her sister is here with her today to provide some collateral.  She did see Dr. Hoyle Barr and had a normal MRI.  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  She is only been on AutoPap for about 1 month now.  She also brings in a written note from 1 of her coworkers that reports some spacing out, forgetting clients names that she should know because she speaks to frequently, and forgetting where she puts things that are very common.  Her sister also provides collateral that at one point over the last month she forgot how to turn on the windshield wiper's in the car.    Review of Systems   Constitutional: Negative.    Respiratory: Negative.    Cardiovascular: Negative.    Gastrointestinal: Negative.    Psychiatric/Behavioral: Positive for confusion. Negative for dysphoric mood, sleep disturbance and suicidal ideas. The patient is not nervous/anxious.        I have reviewed and updated the past medical, social, and family history today and updated them in the EMR.    Outpatient Medications Prior to Visit   Medication Sig Dispense Refill   ??? aspirin (ECOTRIN) 81 MG tablet Take 81 mg by mouth.     ??? canagliflozin (INVOKANA) 300 mg Tab tablet Take 1 tablet (300 mg total) by mouth daily. 90 tablet 3   ??? ergocalciferol, vitamin D2, (VITAMIN D2 ORAL) Take by mouth.     ??? evolocumab 140 mg/mL PnIj Inject 1 pen under the skin.     ??? folic acid (FOLVITE) 1 MG tablet Take 1 mg by mouth.     ??? isosorbide mononitrate (IMDUR) 30 MG 24 hr tablet Take 30 mg by mouth daily.     ??? lisinopriL (PRINIVIL,ZESTRIL) 40 MG tablet Take 1 tablet (40 mg total) by mouth daily. 90 tablet 3   ??? metFORMIN (GLUCOPHAGE-XR) 500 MG 24 hr tablet Take 2 tablets (1,000 mg total) by mouth every morning AND 1 tablet (500 mg total) nightly. 270 tablet 0   ??? methotrexate, PF, (OTREXUP, PF,) 20 mg/0.4 mL AtIn Inject the contents of 1 pen (20 mg) under the skin every seven (7) days. 4.8 mL 3   ??? metoprolol succinate (TOPROL-XL) 50 MG 24 hr tablet Take 1 tablet (50 mg total) by mouth daily.     ??? nitroglycerin (NITROSTAT) 0.4 MG SL tablet PLACE 1 TABLET UNDER THE TONGUE EVERY 5 MINUTES AS NEEDED FOR CHEST PAIN UP TO 3 DOSES THEN CALL 911 IF NO RELIEF     ??? predniSONE (DELTASONE) 1 MG tablet Take 1-4 pills daily as directed for taper (Patient taking differently: Take 6 mg by mouth daily. Take 1-4 pills daily as  directed for taper) 360 tablet 3   ??? venlafaxine (EFFEXOR-XR) 75 MG 24 hr capsule Take 1 capsule (75 mg total) by mouth daily. 90 capsule 0   ??? blood sugar diagnostic Strp by Other route daily. 200 each 2   ??? blood-glucose meter kit Check blood sugar daily. 1 each 0   ??? lancets (FREESTYLE) 28 gauge Misc USE AS INSTRUCTED 200 each 2     No facility-administered medications prior to visit.      Allergies   Allergen Reactions   ??? Pravastatin Muscle Pain     Other Reaction: myalgia       EXAM & DATA   T 36.4 ??C (97.6 ??F) (Oral) - P 64 - RR   - SpO2    BP   BP Readings from Last 3 Encounters:   01/23/19 130/80   10/23/18 134/74   10/18/18 157/86     Wt   Wt Readings from Last 3 Encounters:   01/23/19 74.2 kg (163 lb 8 oz)   01/15/19 74.8 kg (165 lb)   10/23/18 74.8 kg (165 lb)    -  Physical Exam   Constitutional: She is oriented to person, place, and time. She appears well-developed and well-nourished. No distress.   HENT:   Head: Normocephalic and atraumatic.   Eyes: Pupils are equal, round, and reactive to light. Conjunctivae and EOM are normal. No scleral icterus.   Neck: Normal range of motion. Neck supple.   Cardiovascular: Normal rate.   Pulmonary/Chest: Effort normal. No respiratory distress.   Neurological: She is alert and oriented to person, place, and time. Coordination normal.   Skin: Skin is warm and dry. No rash noted.   Psychiatric: She has a normal mood and affect. Her behavior is normal. Judgment and thought content normal.   Vitals reviewed.    Data Reviewed:  Lab Results   Component Value Date    NA 142 11/14/2018    K 4.9 11/14/2018    BUN 17 11/14/2018    CREATININE 0.71 11/14/2018    A1C 7.9 (H) 11/14/2018    CHOL 127 07/25/2018    LDL 38.8 07/25/2018    HDL 62 (H) 07/25/2018    TRIG 131 07/25/2018    ALT 17 11/14/2018       Lab Results   Component Value Date    A1C 7.9 (H) 11/14/2018    A1C 8.4 (H) 07/25/2018     Lab Results   Component Value Date    TRIG 131 07/25/2018     Lab Results   Component Value Date    LDL 38.8 07/25/2018     Lab Results   Component Value Date    ALT 17 11/14/2018    ALT 27.0 07/25/2018     No results found for: APOB  No results found for: HSCRP      ----------------------------------------------------------------------------------------------------------------------  This note was dictated with Dragon Medical and may contain typos missed during proofreading.

## 2019-01-25 LAB — HEMOGLOBIN A1C: Lab: 9 — ABNORMAL HIGH

## 2019-01-31 ENCOUNTER — Other Ambulatory Visit: Payer: Self-pay | Admitting: Cardiovascular Disease

## 2019-01-31 NOTE — Telephone Encounter (Signed)
Dr. Gwenlyn Found is the primary cardiologist and he prescribed these medications. Dr. Angelena Form did the Cath on the pt. Please address

## 2019-02-01 NOTE — Unmapped (Signed)
I informed Madison Harris I had Music therapist, spoke with pharmacist Bill. We got the Tujeo issue straightended out, the med is already to be sent to her.

## 2019-02-22 ENCOUNTER — Other Ambulatory Visit: Payer: Self-pay | Admitting: Cardiovascular Disease

## 2019-03-08 ENCOUNTER — Other Ambulatory Visit: Payer: Self-pay | Admitting: Cardiovascular Disease

## 2019-03-18 ENCOUNTER — Other Ambulatory Visit: Payer: Self-pay | Admitting: Cardiovascular Disease

## 2019-03-19 MED ORDER — METFORMIN ER 500 MG TABLET,EXTENDED RELEASE 24 HR
ORAL_TABLET | 0 refills | 0 days | Status: CP
Start: 2019-03-19 — End: ?

## 2019-03-19 MED ORDER — VENLAFAXINE ER 75 MG CAPSULE,EXTENDED RELEASE 24 HR
ORAL_CAPSULE | Freq: Every day | ORAL | 3 refills | 90 days | Status: CP
Start: 2019-03-19 — End: ?

## 2019-03-26 ENCOUNTER — Other Ambulatory Visit: Payer: Self-pay | Admitting: Cardiovascular Disease

## 2019-03-27 NOTE — Unmapped (Signed)
[facility] Refill Coordination Note    Specialty Medication(s) to be Shipped:   Inflammatory Disorders: Otrexup    Other medication(s) to be shipped: n/a     Madison Harris, DOB: March 23, 1956  Phone: [phone] (home) [phone] (work)      All above HIPAA information was verified with patient.     Completed refill call assessment today to schedule patient's medication shipment from the [facility] [phone]).       Specialty medication(s) and dose(s) confirmed: Regimen is correct and unchanged.   Changes to medications: Madison Harris reports no changes at this time.  Changes to insurance: No  Questions for the pharmacist: No    Confirmed patient received Welcome Packet with first shipment. The patient will receive a drug information handout for each medication shipped and additional FDA Medication Guides as required.       DISEASE/MEDICATION-SPECIFIC INFORMATION        For patients on injectable medications: Patient currently has 3 doses left.  Next injection is scheduled for 04/01/19.    SPECIALTY MEDICATION ADHERENCE     Medication Adherence    Patient reported X missed doses in the last month: 0  Specialty Medication: Otrexup  Patient is on additional specialty medications: No  Patient is on more than two specialty medications: No  Any gaps in refill history greater than 2 weeks in the last 3 months: no  Demonstrates understanding of importance of adherence: yes  Informant: patient                Otrexup 20mg /0.5ml: Patient has 21 days of medication on hand      SHIPPING     Shipping address confirmed in Epic.     Delivery Scheduled: Yes, Expected medication delivery date: 04/10/19.     Medication will be delivered via UPS to the home address in Epic WAM.    Madison Harris   [facility] Specialty Technician

## 2019-03-27 NOTE — Telephone Encounter (Signed)
Forwarding to our [place] office.

## 2019-04-09 MED FILL — OTREXUP (PF) 20 MG/0.4 ML SUBCUTANEOUS AUTO-INJECTOR: SUBCUTANEOUS | 84 days supply | Qty: 4.8 | Fill #2

## 2019-04-09 MED FILL — OTREXUP (PF) 20 MG/0.4 ML SUBCUTANEOUS AUTO-INJECTOR: 84 days supply | Qty: 5 | Fill #2 | Status: AC

## 2019-04-16 ENCOUNTER — Ambulatory Visit: Admit: 2019-04-16 | Discharge: 2019-04-17 | Payer: PRIVATE HEALTH INSURANCE

## 2019-04-16 NOTE — Unmapped (Signed)
Pt seen in consultation at request of Dr. Berta Minor for diabetic eye exam.    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 On insulin. Last A1c ~9. Discussed importance of excellent control of blood sugar, blood pressure, cholesterol and at least once yearly dilated fundus examination. Discussed goal A1c <7.0.  2) syneresis OU- stable.  3) cataract- not visually significant. Observe. new rx provided      carbon copy: Dr. Tye Maryland     INTERPRETATION EXTENDED OPHTHALMOSCOPT MACULA (90Dlens)  Macula - right:  Normal, syneresis  Macula - left:  Normal, syneresis

## 2019-04-25 ENCOUNTER — Encounter
Admit: 2019-04-25 | Discharge: 2019-04-26 | Payer: PRIVATE HEALTH INSURANCE | Attending: Internal Medicine | Primary: Internal Medicine

## 2019-04-25 DIAGNOSIS — I10 Essential (primary) hypertension: Principal | ICD-10-CM

## 2019-04-25 DIAGNOSIS — Z794 Long term (current) use of insulin: Secondary | ICD-10-CM

## 2019-04-25 DIAGNOSIS — I2510 Atherosclerotic heart disease of native coronary artery without angina pectoris: Principal | ICD-10-CM

## 2019-04-25 DIAGNOSIS — E1165 Type 2 diabetes mellitus with hyperglycemia: Principal | ICD-10-CM

## 2019-04-25 DIAGNOSIS — E538 Deficiency of other specified B group vitamins: Principal | ICD-10-CM

## 2019-04-25 DIAGNOSIS — E782 Mixed hyperlipidemia: Principal | ICD-10-CM

## 2019-04-25 DIAGNOSIS — Z1231 Encounter for screening mammogram for malignant neoplasm of breast: Principal | ICD-10-CM

## 2019-04-25 LAB — COMPREHENSIVE METABOLIC PANEL
ALBUMIN/GLOBULIN RATIO: 1.1 (ref 1.1–2.2)
ALBUMIN: 3.6 g/dL (ref 3.5–5.0)
ALKALINE PHOSPHATASE: 69 U/L (ref 50–136)
ANION GAP: 5 mmol/L
AST (SGOT): 9 U/L (ref 7–31)
BILIRUBIN TOTAL: 0.4 mg/dL (ref 0.0–1.5)
BLOOD UREA NITROGEN: 19 mg/dL (ref 5–26)
BUN / CREAT RATIO: 22 (ref 12–25)
CALCIUM: 9.3 mg/dL (ref 8.5–10.5)
CHLORIDE: 104 mmol/L (ref 95–110)
CO2: 28.2 mmol/L (ref 21.0–31.0)
CREATININE: 0.88 mg/dL (ref 0.50–1.50)
EGFR CKD-EPI AA FEMALE: 82 mL/min/{1.73_m2} (ref >=60)
GLOBULIN, TOTAL: 3.4 g/dL (ref 1.5–4.3)
GLUCOSE RANDOM: 184 mg/dL — ABNORMAL HIGH (ref 60–100)
POTASSIUM: 4.8 mmol/L (ref 3.5–5.1)
PROTEIN TOTAL: 7 g/dL (ref 6.4–8.3)
SODIUM: 137 mmol/L (ref 136–145)

## 2019-04-25 LAB — ALBUMIN/GLOBULIN RATIO: Lab: 1.1

## 2019-04-25 MED ORDER — PEN NEEDLE, DIABETIC 31 GAUGE X 5/16" (8 MM)
3 refills | 0 days | Status: CP
Start: 2019-04-25 — End: ?
  Filled 2019-04-26: qty 100, 33d supply, fill #0

## 2019-04-25 MED ORDER — SEMAGLUTIDE 7 MG TABLET
ORAL_TABLET | ORAL | 3 refills | 0 days | Status: CP
Start: 2019-04-25 — End: 2020-05-24

## 2019-04-25 NOTE — Unmapped (Signed)
[email or web address]

## 2019-04-25 NOTE — Unmapped (Signed)
[facility] INTER[facility] MEDICINE  [address] [place], [place] [address]  Ph: [phone], F: [phone]            ASSESSMENT & PLAN   Madison Harris was seen today for follow-up.    Diagnoses and all orders for this visit:    T2DM, uncontrolled, on insulin  Her last blood glucose 3 months ago was uncontrolled so we will recheck her A1c today.  I would like to get her onto a GLP-1 medication which will require a prior authorization.  She remains hyperinsulinemic, is overweight, and has macrovascular disease already and so insulin is not an ideal medication for her.  Additionally, GLP-1 has data to support cardiovascular risk reduction in patients with cardiovascular disease.  -     Comprehensive Metabolic Panel; Future  -     Hemoglobin A1c; Future  -     semaglutide 7 mg Tab; Take 3.5 mg by mouth daily for 30 days, THEN 7 mg daily.    B12 deficiency  Recommended starting oral B12 again. If not tolerated, will start IM injections.    CAD s/p PCI  Essential hypertension  Mixed hyperlipidemia  Stable. On appropriate medical therapy.  Asymptomatic and euvolemic today.    Encounter for screening mammogram for malignant neoplasm of breast  -     Mammo Digital Screening Bilateral; Future    Other orders  -     INFLUENZA VACCINE (QUAD) IM - 6 MO-ADULT - PF  -     pen needle, diabetic 31 gauge x 5/16 (8 mm) Ndle; Qd-tid as directed    HPI     Chief Complaint   Patient presents with   ??? Follow-up     Madison Harris is a 63 y.o. female in today for follow up of her CAD, hyperlipidemia, hypertension, diabetes.  She is on the following medication regimen which she takes as prescribed.  She denies any notable side effects.    Toujeo 28u nightly  Invokana 300mg  daily  Metformin 1000mg  BID  Lisinopril 40mg  daily  Toprol XL 50mg  daily  Imdur 30mg  daily  Evolocumab  Asa 81mg  daily At her last visit we increased her Toujeo because she had stopped it altogether.  She is now on 28 units which is up 3 units from where I prescribed.  Her fasting blood sugars have ranged 77-130. She denies any symptomatic or meter lows.  She denies pneumococcal vaccination.  She has never been on a GLP-1 medication.  She denies any history or family history of thyroid cancer or pancreatitis.  She denies chest pain, SOB, DOE, orthopnea, PND, pre-syncope or syncope.       Review of Systems   Constitutional: Negative.    Respiratory: Negative.    Cardiovascular: Negative.    Neurological: Negative.        I have reviewed and updated the past medical, social, and family history today and updated them in the EMR.    Outpatient Medications Prior to Visit   Medication Sig Dispense Refill   ??? aspirin (ECOTRIN) 81 MG tablet Take 81 mg by mouth.     ??? blood sugar diagnostic Strp by Other route daily. 200 each 2   ??? blood-glucose meter kit Check blood sugar daily. 1 each 0   ??? canagliflozin (INVOKANA) 300 mg Tab tablet Take 1 tablet (300 mg total) by mouth daily. 90 tablet 3   ??? ergocalciferol, vitamin D2, (VITAMIN D2 ORAL) Take by mouth.     ???  evolocumab 140 mg/mL PnIj Inject 1 pen under the skin.     ??? folic acid (FOLVITE) 1 MG tablet Take 1 mg by mouth.     ??? insulin glargine U-300 conc (TOUJEO SOLOSTAR U-300 INSULIN) 300 unit/mL (1.5 mL) injection pen Inject 0.083 mL (25 Units total) under the skin nightly. 3 mL 2   ??? isosorbide mononitrate (IMDUR) 30 MG 24 hr tablet Take 30 mg by mouth daily.     ??? lancets (FREESTYLE) 28 gauge Misc USE AS INSTRUCTED 200 each 2   ??? lisinopriL (PRINIVIL,ZESTRIL) 40 MG tablet Take 1 tablet (40 mg total) by mouth daily. 90 tablet 3   ??? metFORMIN (GLUCOPHAGE-XR) 500 MG 24 hr tablet TAKE 2 TABLETS BY MOUTH EVERY MORNING AND 1 TABLET NIGHTLY GENERIC EQUIVALENT FOR GLUCOPHAGE- XR 270 tablet 0 ??? methotrexate, PF, (OTREXUP, PF,) 20 mg/0.4 mL AtIn Inject the contents of 1 pen (20 mg) under the skin every seven (7) days. 4.8 mL 3   ??? metoprolol succinate (TOPROL-XL) 50 MG 24 hr tablet Take 1 tablet (50 mg total) by mouth daily.     ??? nitroglycerin (NITROSTAT) 0.4 MG SL tablet PLACE 1 TABLET UNDER THE TONGUE EVERY 5 MINUTES AS NEEDED FOR CHEST PAIN UP TO 3 DOSES THEN CALL 911 IF NO RELIEF     ??? predniSONE (DELTASONE) 1 MG tablet Take 1-4 pills daily as directed for taper (Patient taking differently: Take 6 mg by mouth daily. Take 1-4 pills daily as directed for taper) 360 tablet 3   ??? venlafaxine (EFFEXOR-XR) 75 MG 24 hr capsule Take 1 capsule (75 mg total) by mouth daily. 90 capsule 3     No facility-administered medications prior to visit.      Allergies   Allergen Reactions   ??? Pravastatin Muscle Pain     Other Reaction: myalgia       EXAM & DATA   T 36.2 ??C (97.2 ??F) - P 69 - RR   - SpO2 97%  BP   BP Readings from Last 3 Encounters:   04/25/19 112/70   01/23/19 130/80   10/23/18 134/74     Wt   Wt Readings from Last 3 Encounters:   01/23/19 74.2 kg (163 lb 8 oz)   01/15/19 74.8 kg (165 lb)   10/23/18 74.8 kg (165 lb)    -  Physical Exam   Constitutional: She is oriented to person, place, and time. Vital signs are normal. She appears well-developed and well-nourished. No distress.   HENT:   Head: Normocephalic and atraumatic.   Eyes: Pupils are equal, round, and reactive to light. Conjunctivae and EOM are normal. No scleral icterus.   Cardiovascular: Normal rate, regular rhythm, normal heart sounds and intact distal pulses. Exam reveals no friction rub.   No murmur heard.  Pulmonary/Chest: Effort normal and breath sounds normal. No respiratory distress. She has no wheezes. She has no rales.   Musculoskeletal:         General: No edema.   Neurological: She is alert and oriented to person, place, and time.   Vitals reviewed.    Data Reviewed:  Lab Results   Component Value Date    NA 142 11/14/2018 K 4.9 11/14/2018    BUN 17 11/14/2018    CREATININE 0.80 01/23/2019    A1C 9.0 (H) 01/23/2019    CHOL 127 07/25/2018    LDL 38.8 07/25/2018    HDL 62 (H) 07/25/2018    TRIG 131 07/25/2018    ALT  21 01/23/2019       Lab Results   Component Value Date    A1C 9.0 (H) 01/23/2019    A1C 7.9 (H) 11/14/2018    A1C 8.4 (H) 07/25/2018     Lab Results   Component Value Date    TRIG 131 07/25/2018     Lab Results   Component Value Date    LDL 38.8 07/25/2018     Lab Results   Component Value Date    ALT 21 01/23/2019    ALT 17 11/14/2018    ALT 27.0 07/25/2018     No results found for: APOB  No results found for: HSCRP        ----------------------------------------------------------------------------------------------------------------------  This note was dictated with Dragon Medical and may contain typos missed during proofreading.

## 2019-04-26 DIAGNOSIS — E1165 Type 2 diabetes mellitus with hyperglycemia: Principal | ICD-10-CM

## 2019-04-26 DIAGNOSIS — Z794 Long term (current) use of insulin: Principal | ICD-10-CM

## 2019-04-26 LAB — HEMOGLOBIN A1C
HEMOGLOBIN A1C: 7.8 % — ABNORMAL HIGH (ref 4.9–6.1)
Hemoglobin A1c/Hemoglobin.total:MFr:Pt:Bld:Qn:: 7.8 — ABNORMAL HIGH

## 2019-04-26 MED ORDER — SEMAGLUTIDE 3 MG TABLET
ORAL_TABLET | Freq: Every day | ORAL | 0 refills | 0.00000 days | Status: CP
Start: 2019-04-26 — End: ?
  Filled 2019-04-30: qty 30, 30d supply, fill #0

## 2019-04-26 MED FILL — BD ULTRA-FINE SHORT PEN NEEDLE 31 GAUGE X 5/16" (8 MM): 33 days supply | Qty: 100 | Fill #0 | Status: AC

## 2019-04-26 NOTE — Unmapped (Signed)
-----   Message from Wylene Simmer sent at 04/26/2019 10:00 AM EST -----  Regarding: Rybelsus prescription  Good morning Dr. Tye Maryland,    Rybelsus tablets must be taken whole, so the patient wouldn't be able to take 1/2 tablet for the initial 30 days. There is a 3mg  tablet that is available, so I wanted to reach out to see if that could be an option for the patient? If so, please send over a new prescription for the 3mg  tablets for a 30 day supply, and we can update the script for the 7mg  tablets to take out the 1/2 tablet part.    Thank you!    Gaynell Face, CPhT MPH-c  Pharmacy Technician Specialist - Triage  [facility]  (P): [phone]

## 2019-04-30 MED FILL — RYBELSUS 3 MG TABLET: 30 days supply | Qty: 30 | Fill #0 | Status: AC

## 2019-05-01 IMAGING — MR MR HEAD WO/W CM
12 series · 48 of 48 positions shown · IV contrast (18ml multihance)
Comparison: No prior brain imaging.

CLINICAL DATA: 62-year-old female with memory loss for 1 year,
tremor and confusion.

EXAM:
MRI HEAD WITHOUT AND WITH CONTRAST
TECHNIQUE: Multiplanar, multiecho pulse sequences of the brain and surrounding
structures were obtained without and with intravenous contrast.
CONTRAST:  18mL MULTIHANCE GADOBENATE DIMEGLUMINE 529 MG/ML IV SOLN

[Series 2: T1 · sagittal · 5.0mm · 0.45mm/px · 2 of 21 slices shown]
[im 1/21]
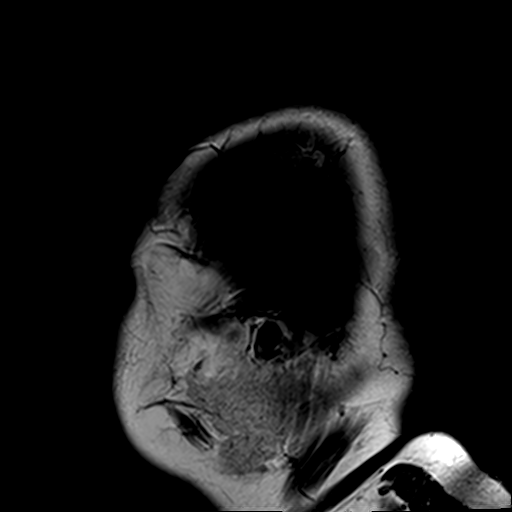
[im 21/21]
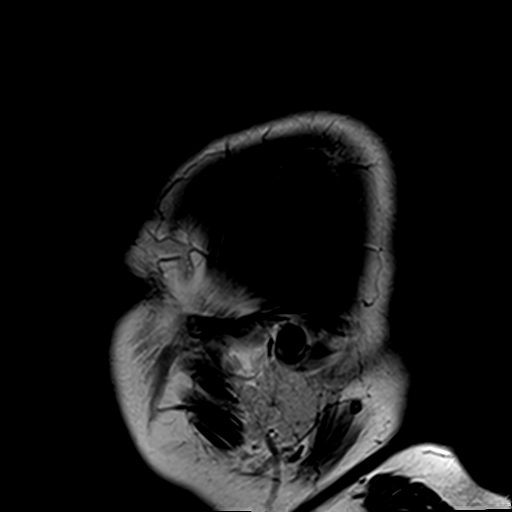

[Series 3: DWI · axial · 3.0mm · 1.80mm/px · z∈[-32,+112]mm · 7 of 100 slices shown (1 of 4)]
[im 1/100]
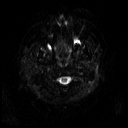
[im 17/100]
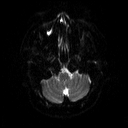
[im 34/100]
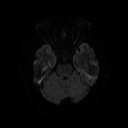
[im 50/100]
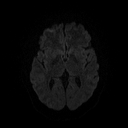
[im 67/100]
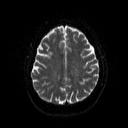
[im 83/100]
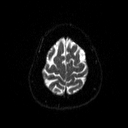
[im 100/100]
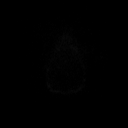

[Series 4: DWI · axial · 3.0mm · 1.80mm/px · z∈[-32,+112]mm · 3 of 48 slices shown (2 of 4)]
[im 1/48]
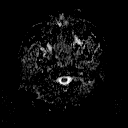
[im 24/48]
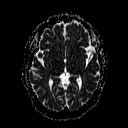
[im 48/48]
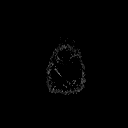

[Series 5: DWI · coronal · 5.0mm · 1.80mm/px · 4 of 61 slices shown (3 of 4)]
[im 1/61]
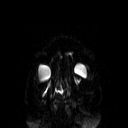
[im 21/61]
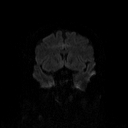
[im 41/61]
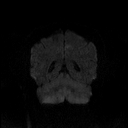
[im 61/61]
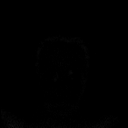

[Series 6: DWI · coronal · 5.0mm · 1.80mm/px · 2 of 32 slices shown (4 of 4)]
[im 1/32]
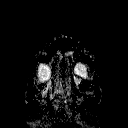
[im 32/32]
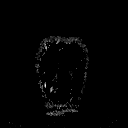

[Series 7: T2 · axial · 5.0mm · 0.51mm/px · z∈[-36,+116]mm · 2 of 24 slices shown (1 of 2)]
[im 1/24]
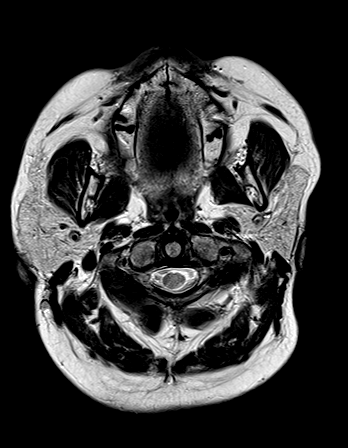
[im 24/24]
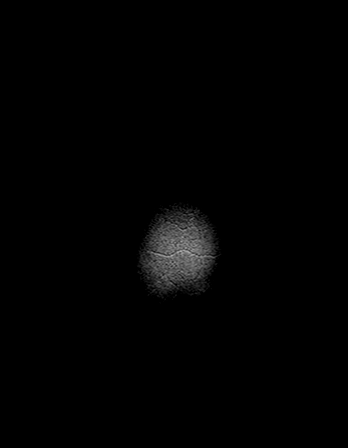

[Series 8: FLAIR · axial · 3.0mm · 0.45mm/px · z∈[-35,+115]mm · 2 of 34 slices shown]
[im 1/34]
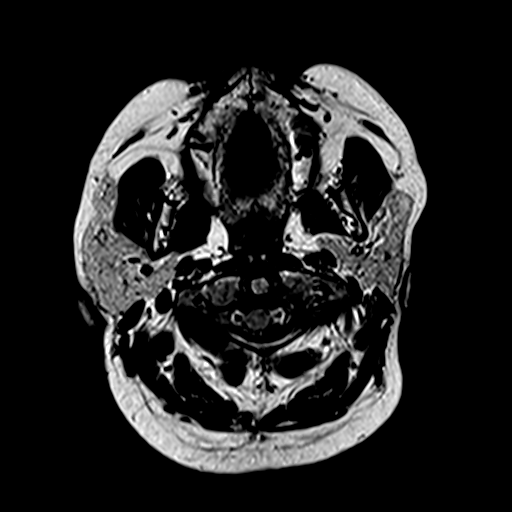
[im 34/34]
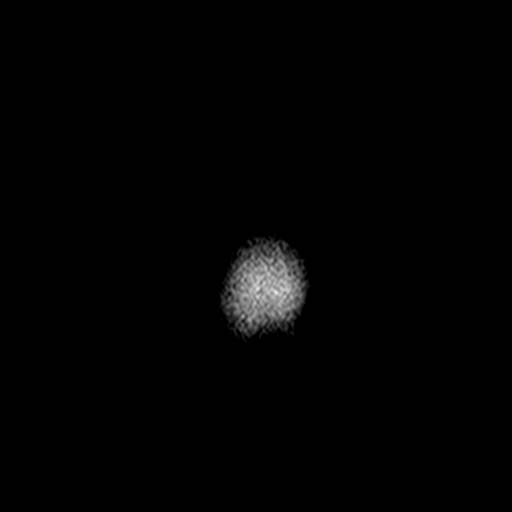

[Series 10: swi_images · axial · 5.0mm · 0.90mm/px · z∈[-36,+116]mm · 2 of 32 slices shown]
[im 1/32]
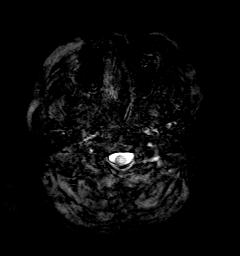
[im 32/32]
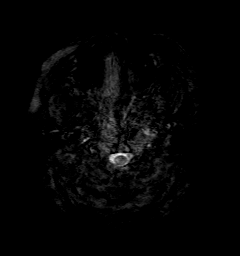

[Series 11: t1_mpr_tra · axial · 1.1mm · 0.75mm/px · z∈[-39,+121]mm · 10 of 144 slices shown (1 of 2)]
[im 1/144]
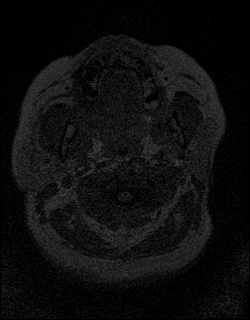
[im 16/144]
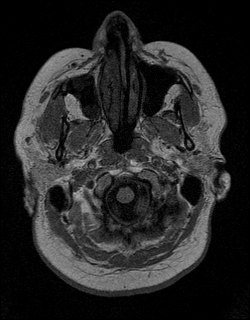
[im 32/144]
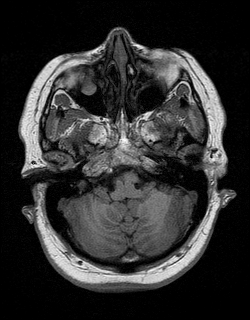
[im 48/144]
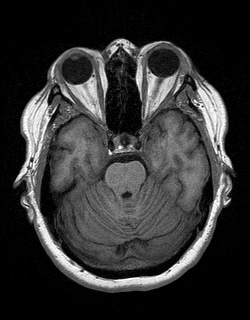
[im 64/144]
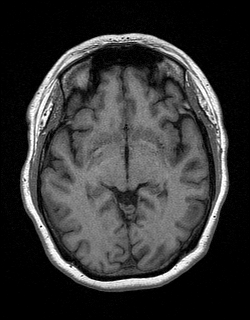
[im 80/144]
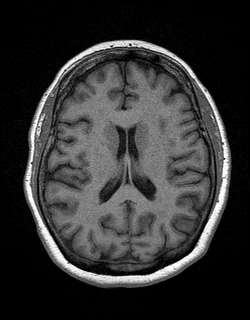
[im 96/144]
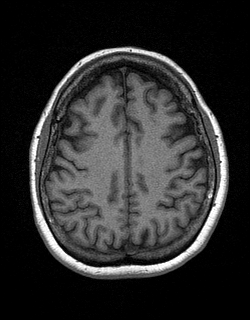
[im 112/144]
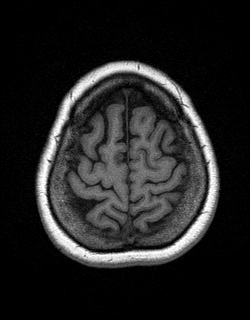
[im 128/144]
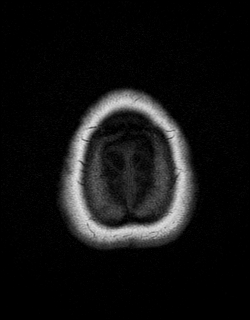
[im 144/144]
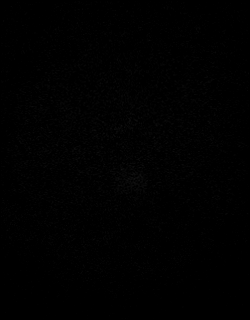

[Series 12: T2 · coronal · 5.0mm · 0.45mm/px · 2 of 27 slices shown (2 of 2)]
[im 1/27]
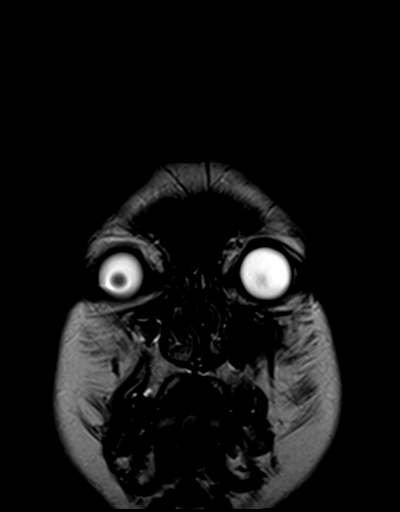
[im 27/27]
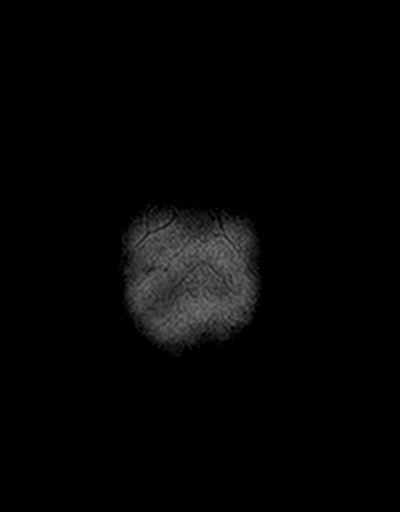

[Series 13: t1_mpr_tra · axial · 1.1mm · 0.75mm/px · z∈[-39,+121]mm · 10 of 144 slices shown (2 of 2)]
[im 1/144]
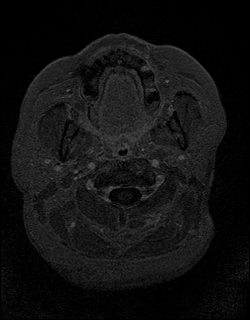
[im 16/144]
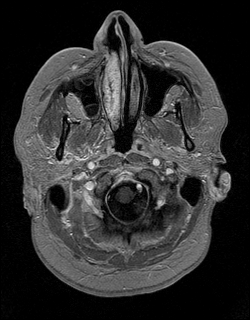
[im 32/144]
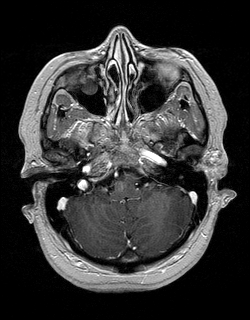
[im 48/144]
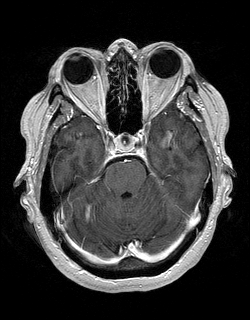
[im 64/144]
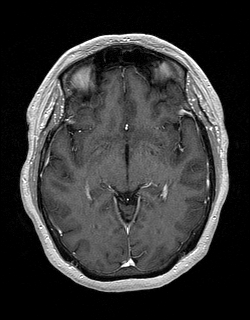
[im 80/144]
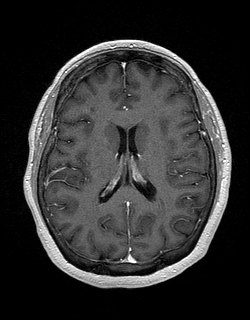
[im 96/144]
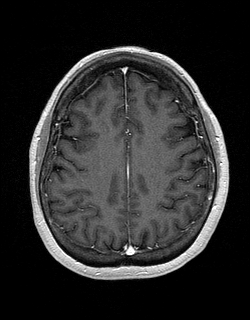
[im 112/144]
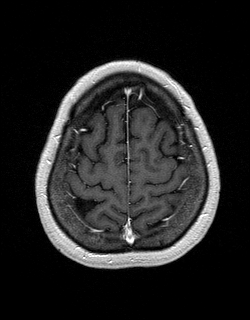
[im 128/144]
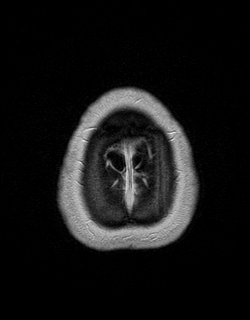
[im 144/144]
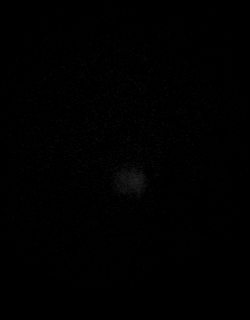

[Series 14: post cor · coronal · 5.0mm · 0.45mm/px · 2 of 27 slices shown]
[im 1/27]
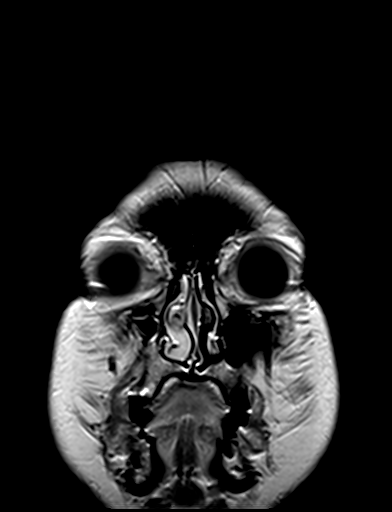
[im 27/27]
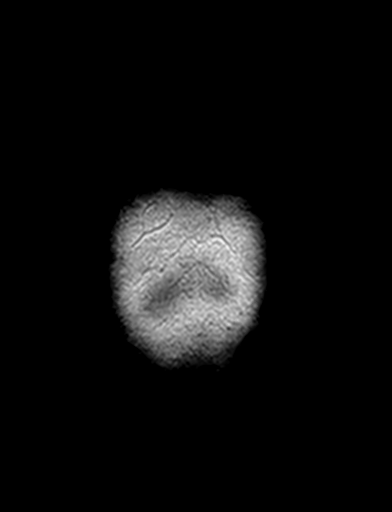

[48 of 48 positions shown; findings below may reference images not displayed]

FINDINGS: Brain: Cerebral volume is within normal limits for age. No
restricted diffusion to suggest acute infarction. No midline shift,
mass effect, evidence of mass lesion, ventriculomegaly, extra-axial
collection or acute intracranial hemorrhage. Cervicomedullary
junction and pituitary are within normal limits.

Gray and white matter signal is within normal limits for age
throughout the brain. No cortical encephalomalacia or chronic
cerebral blood products. Temporal lobe structures appear within
normal limits. No abnormal enhancement identified. No dural
thickening.

Vascular: Major intracranial vascular flow voids are preserved. The
left vertebral artery appears dominant. The major dural venous
sinuses are enhancing and appear to be patent.

Skull and upper cervical spine: Negative visible cervical spine.
Normal bone marrow signal.

Sinuses/Orbits: Negative orbits.

Scattered bilateral maxillary sinus mucous retention cysts, most
numerous on the right. Frontal, ethmoid and sphenoid sinuses are
clear.

Other: Mastoids are clear. Visible internal auditory structures
appear normal. Scalp and face soft tissues are negative.
IMPRESSION: Normal for age MRI appearance of the brain.

## 2019-05-02 NOTE — Unmapped (Signed)
patient has enough medication on hand, rescheduling refill call for 12/3

## 2019-05-23 NOTE — Unmapped (Signed)
[facility] Clinical Assessment & Refill Coordination Note    Madison Harris, DOB: April 21, 1956  Phone: [phone] (home) [phone] (work)    All above HIPAA information was verified with patient.     Specialty Medication(s):   Inflammatory Disorders: Otrexup     Current Outpatient Medications   Medication Sig Dispense Refill   ??? aspirin (ECOTRIN) 81 MG tablet Take 81 mg by mouth.     ??? blood sugar diagnostic Strp by Other route daily. 200 each 2   ??? blood-glucose meter kit Check blood sugar daily. 1 each 0   ??? canagliflozin (INVOKANA) 300 mg Tab tablet Take 1 tablet (300 mg total) by mouth daily. 90 tablet 3   ??? ergocalciferol, vitamin D2, (VITAMIN D2 ORAL) Take by mouth.     ??? evolocumab 140 mg/mL PnIj Inject 1 pen under the skin.     ??? folic acid (FOLVITE) 1 MG tablet Take 1 mg by mouth.     ??? insulin glargine U-300 conc (TOUJEO SOLOSTAR U-300 INSULIN) 300 unit/mL (1.5 mL) injection pen Inject 0.083 mL (25 Units total) under the skin nightly. 3 mL 2   ??? isosorbide mononitrate (IMDUR) 30 MG 24 hr tablet Take 30 mg by mouth daily.     ??? lancets (FREESTYLE) 28 gauge Misc USE AS INSTRUCTED 200 each 2   ??? lisinopriL (PRINIVIL,ZESTRIL) 40 MG tablet Take 1 tablet (40 mg total) by mouth daily. 90 tablet 3   ??? metFORMIN (GLUCOPHAGE-XR) 500 MG 24 hr tablet TAKE 2 TABLETS BY MOUTH EVERY MORNING AND 1 TABLET NIGHTLY GENERIC EQUIVALENT FOR GLUCOPHAGE- XR 270 tablet 0   ??? methotrexate, PF, (OTREXUP, PF,) 20 mg/0.4 mL AtIn Inject the contents of 1 pen (20 mg) under the skin every seven (7) days. 4.8 mL 3   ??? metoprolol succinate (TOPROL-XL) 50 MG 24 hr tablet Take 1 tablet (50 mg total) by mouth daily.     ??? nitroglycerin (NITROSTAT) 0.4 MG SL tablet PLACE 1 TABLET UNDER THE TONGUE EVERY 5 MINUTES AS NEEDED FOR CHEST PAIN UP TO 3 DOSES THEN CALL 911 IF NO RELIEF     ??? pen needle, diabetic 31 gauge x 5/16 (8 mm) Ndle Use 1-3 times daily as directed. 100 each 3 ??? predniSONE (DELTASONE) 1 MG tablet Take 1-4 pills daily as directed for taper (Patient taking differently: Take 6 mg by mouth daily. Take 1-4 pills daily as directed for taper) 360 tablet 3   ??? semaglutide 3 mg Tab Take 1 tablet (3 mg) by mouth daily for 30 days, then increase to 1 (7mg ) tablet daily as directed. 30 tablet 0   ??? semaglutide 7 mg Tab Take 1 tablet (7mg ) by mouth daily. 90 tablet 3   ??? venlafaxine (EFFEXOR-XR) 75 MG 24 hr capsule Take 1 capsule (75 mg total) by mouth daily. 90 capsule 3     No current facility-administered medications for this visit.         Changes to medications: Chanci reports no changes at this time.    Allergies   Allergen Reactions   ??? Pravastatin Muscle Pain     Other Reaction: myalgia       Changes to allergies: No    SPECIALTY MEDICATION ADHERENCE     Otrexup 20mg /0.35ml: 42 days of medicine on hand     Medication Adherence    Patient reported X missed doses in the last month: 0  Specialty Medication: Otrexup 20mg /0.27ml  Specialty medication(s) dose(s) confirmed: Regimen is correct and unchanged.     Are there any concerns with adherence? No    Adherence counseling provided? Not needed    CLINICAL MANAGEMENT AND INTERVENTION      Clinical Benefit Assessment:    Do you feel the medicine is effective or helping your condition? Yes    Clinical Benefit counseling provided? Not needed    Adverse Effects Assessment:    Are you experiencing any side effects? No    Are you experiencing difficulty administering your medicine? No    Quality of Life Assessment:    How many days over the past month did your rheumatoid arthritis keep you from your normal activities? For example, brushing your teeth or getting up in the morning. 0    Have you discussed this with your provider? Not needed    Therapy Appropriateness:    Is therapy appropriate? Yes, therapy is appropriate and should be continued    DISEASE/MEDICATION-SPECIFIC INFORMATION For patients on injectable medications: Patient currently has 6 doses left.  Next injection is scheduled for 'sometime next week'.    PATIENT SPECIFIC NEEDS     ? Does the patient have any physical, cognitive, or cultural barriers? No    ? Is the patient high risk? No     ? Does the patient require a Care Management Plan? No     ? Does the patient require physician intervention or other additional services (i.e. nutrition, smoking cessation, social work)? No      SHIPPING     Specialty Medication(s) to be Shipped: none    Other medication(s) to be shipped: Rybelsus     Changes to insurance: No    Delivery Scheduled: Yes, Expected medication delivery date: 05/28/2019.     Medication will be delivered via UPS to the confirmed prescription address in [facility].    The patient will receive a drug information handout for each medication shipped and additional FDA Medication Guides as required.  Verified that patient has previously received a Conservation officer, historic buildings.    All of the patient's questions and concerns have been addressed.    Karene Fry Divya Munshi   [facility] Specialty Pharmacist

## 2019-05-28 NOTE — Unmapped (Signed)
Madison Harris 's Rybelsus shipment will be canceled  as a result of a high copay.     I have reached out to the patient and communicated the delay. We will not reschedule the medication due to the patient stating she is no longer on this medication and have removed this/these medication(s) from the work request.  We have canceled this work request.  I called patient to confirm the copay and she said she is no longer taking this medication due to bad side effects. She asked that I cancel it and no longer call her about it.

## 2019-05-29 ENCOUNTER — Encounter: Admit: 2019-05-29 | Discharge: 2019-05-29 | Payer: PRIVATE HEALTH INSURANCE

## 2019-05-29 ENCOUNTER — Telehealth
Admit: 2019-05-29 | Discharge: 2019-05-29 | Payer: PRIVATE HEALTH INSURANCE | Attending: Internal Medicine | Primary: Internal Medicine

## 2019-05-29 DIAGNOSIS — E1165 Type 2 diabetes mellitus with hyperglycemia: Principal | ICD-10-CM

## 2019-05-29 DIAGNOSIS — Z794 Long term (current) use of insulin: Secondary | ICD-10-CM

## 2019-05-29 NOTE — Unmapped (Signed)
Scheduled Virtual appt w/ you to address today at 3:40 p

## 2019-05-29 NOTE — Unmapped (Signed)
This visit is conducted via Programmer, applications.  - converted to phone call.    Patient is currently located in the state of [place].  I have identified myself to the patient and conveyed my credentials to Ms. Hoeg  I have explained the capabilities and limitations of telemedicine and the patient and myself both agree that it is appropriate for their current circumstances/symptoms.  In case we get disconnected, patient's phone number is [phone] (home) [phone] (work)   Patient has signed informed consent on file in medical record.    Is there someone else in the room? No.     Subjective:   Madison Harris is a 63 y.o. female who presents  for a video visit amidst COVID-19 outbreak.     Had side effects to Semaglutide.  It was horrific.  She took her last dose 5 days ago.  Since then, diarrhea is much better now with some imodium which has helped.  No fevers or chills, no blood in stool.  Was on Insulin Troujeo 28u nightly which she has not been on since we started Semaglutide.    ROS   ROS negative unless otherwise noted in HPI.    Objective:   There were no vitals filed for this visit.  GEN: No acute distress.   SKIN: Color is normal. No rashes.  PSYCH: Appropriate affect, normal mood  RESP: Normal work of breathing, no retractions    Assessment and Plan:     Problem List Items Addressed This Visit        Other    T2DM, uncontrolled, on insulin - Primary (Chronic)        I did raise the possibility of trying the semaglutide every other day but she does not want to try this again.  I also brought up the possibility of trying an injectable GLP-1 but she does not want do that for the time being.  She will restart her insulin Toujeo 28 units nightly in addition to her Metformin and Invokana.  Follow-up in the next few months.    Visit conducted via Video  -- converted to phone due to technical difficulties.    Total time spent with patient: 9 mins

## 2019-06-09 DIAGNOSIS — Z794 Long term (current) use of insulin: Principal | ICD-10-CM

## 2019-06-09 DIAGNOSIS — E1165 Type 2 diabetes mellitus with hyperglycemia: Principal | ICD-10-CM

## 2019-06-10 MED ORDER — TOUJEO SOLOSTAR U-300 INSULIN 300 UNIT/ML (1.5 ML) SUBCUTANEOUS PEN
0 refills | 0 days | Status: CP
Start: 2019-06-10 — End: ?

## 2019-06-10 MED ORDER — METFORMIN ER 500 MG TABLET,EXTENDED RELEASE 24 HR
ORAL_TABLET | 0 refills | 0 days | Status: CP
Start: 2019-06-10 — End: ?

## 2019-06-24 MED ORDER — CANAGLIFLOZIN 300 MG TABLET
ORAL_TABLET | Freq: Every day | ORAL | 3 refills | 90.00000 days | Status: CP
Start: 2019-06-24 — End: ?

## 2019-07-01 NOTE — Unmapped (Signed)
[facility] Clinical Assessment & Refill Coordination Note    Madison Harris, DOB: 11/05/1955  Phone: [phone] (home) [phone] (work)    All above HIPAA information was verified with patient.     Was a Nurse, learning disability used for this call? No    Specialty Medication(s):   Inflammatory Disorders: Otrexup     Current Outpatient Medications   Medication Sig Dispense Refill   ??? aspirin (ECOTRIN) 81 MG tablet Take 81 mg by mouth.     ??? blood sugar diagnostic Strp by Other route daily. 200 each 2   ??? blood-glucose meter kit Check blood sugar daily. 1 each 0   ??? canagliflozin (INVOKANA) 300 mg Tab tablet Take 1 tablet (300 mg total) by mouth daily with evening meal. 90 tablet 3   ??? ergocalciferol, vitamin D2, (VITAMIN D2 ORAL) Take by mouth.     ??? evolocumab 140 mg/mL PnIj Inject 1 pen under the skin.     ??? folic acid (FOLVITE) 1 MG tablet Take 1 mg by mouth.     ??? isosorbide mononitrate (IMDUR) 30 MG 24 hr tablet Take 30 mg by mouth daily.     ??? lancets (FREESTYLE) 28 gauge Misc USE AS INSTRUCTED 200 each 2   ??? lisinopriL (PRINIVIL,ZESTRIL) 40 MG tablet Take 1 tablet (40 mg total) by mouth daily. 90 tablet 3   ??? metFORMIN (GLUCOPHAGE-XR) 500 MG 24 hr tablet TAKE 2 TABLETS BY MOUTH EVERY MORNING AND 1 TABLET NIGHTLY GENERIC EQUIVALENT FOR GLUCOPHAGE- XR 270 tablet 0   ??? methotrexate, PF, (OTREXUP, PF,) 20 mg/0.4 mL AtIn Inject the contents of 1 pen (20 mg) under the skin every seven (7) days. 4.8 mL 3   ??? metoprolol succinate (TOPROL-XL) 50 MG 24 hr tablet Take 1 tablet (50 mg total) by mouth daily.     ??? nitroglycerin (NITROSTAT) 0.4 MG SL tablet PLACE 1 TABLET UNDER THE TONGUE EVERY 5 MINUTES AS NEEDED FOR CHEST PAIN UP TO 3 DOSES THEN CALL 911 IF NO RELIEF     ??? pen needle, diabetic 31 gauge x 5/16 (8 mm) Ndle Use 1-3 times daily as directed. 100 each 3 ??? predniSONE (DELTASONE) 1 MG tablet Take 1-4 pills daily as directed for taper (Patient taking differently: Take 6 mg by mouth daily. Take 1-4 pills daily as directed for taper) 360 tablet 3   ??? TOUJEO SOLOSTAR U-300 INSULIN 300 unit/mL (1.5 mL) injection pen INJECT 25 UNITS UNDER THE SKIN NIGHTLY 4.5 mL 0   ??? venlafaxine (EFFEXOR-XR) 75 MG 24 hr capsule Take 1 capsule (75 mg total) by mouth daily. 90 capsule 3     No current facility-administered medications for this visit.         Changes to medications: Madison Harris reports no changes at this time.    Allergies   Allergen Reactions   ??? Pravastatin Muscle Pain     Other Reaction: myalgia   ??? Semaglutide Diarrhea       Changes to allergies: No    SPECIALTY MEDICATION ADHERENCE     Otezla 20mg /0.11ml: 0 days of medicine on hand     Medication Adherence    Patient reported X missed doses in the last month: 0  Specialty Medication: Otrexup 20mg /0.15ml          Specialty medication(s) dose(s) confirmed: Regimen is correct and unchanged.     Are there any concerns with adherence? No    Adherence counseling provided? Not needed    CLINICAL  MANAGEMENT AND INTERVENTION      Clinical Benefit Assessment:    Do you feel the medicine is effective or helping your condition? Yes    Clinical Benefit counseling provided? Not needed    Adverse Effects Assessment:    Are you experiencing any side effects? No    Are you experiencing difficulty administering your medicine? No    Quality of Life Assessment:    How many days over the past month did your rheumatoid arthritis keep you from your normal activities? For example, brushing your teeth or getting up in the morning. Patient declined to answer    Have you discussed this with your provider? Not needed    Therapy Appropriateness:    Is therapy appropriate? Yes, therapy is appropriate and should be continued    DISEASE/MEDICATION-SPECIFIC INFORMATION For patients on injectable medications: Patient currently has 0 doses left.  Next injection is scheduled for 07/08/2019.    PATIENT SPECIFIC NEEDS     ? Does the patient have any physical, cognitive, or cultural barriers? No    ? Is the patient high risk? No     ? Does the patient require a Care Management Plan? No     ? Does the patient require physician intervention or other additional services (i.e. nutrition, smoking cessation, social work)? No      SHIPPING     Specialty Medication(s) to be Shipped:   Inflammatory Disorders: Otrexup 20mg /0.27ml    Other medication(s) to be shipped: none       Changes to insurance: No    Delivery Scheduled: Yes, Expected medication delivery date: 07/03/2019.     Medication will be delivered via UPS to the confirmed prescription address in [facility].    The patient will receive a drug information handout for each medication shipped and additional FDA Medication Guides as required.  Verified that patient has previously received a Conservation officer, historic buildings.    All of the patient's questions and concerns have been addressed.    Karene Fry Tessla Spurling   [facility] Specialty Pharmacist

## 2019-07-03 DIAGNOSIS — M0609 Rheumatoid arthritis without rheumatoid factor, multiple sites: Principal | ICD-10-CM

## 2019-07-04 ENCOUNTER — Ambulatory Visit
Admit: 2019-07-04 | Discharge: 2019-07-05 | Payer: PRIVATE HEALTH INSURANCE | Attending: Cardiovascular Disease | Primary: Cardiovascular Disease

## 2019-07-04 DIAGNOSIS — I2510 Atherosclerotic heart disease of native coronary artery without angina pectoris: Principal | ICD-10-CM

## 2019-07-04 MED ORDER — CARVEDILOL 12.5 MG TABLET
ORAL_TABLET | Freq: Two times a day (BID) | ORAL | 3 refills | 90 days | Status: CP
Start: 2019-07-04 — End: 2019-10-02
  Filled 2019-07-11: qty 180, 90d supply, fill #0

## 2019-07-04 MED ORDER — LISINOPRIL 40 MG TABLET
ORAL_TABLET | Freq: Every day | ORAL | 3 refills | 90 days | Status: CP
Start: 2019-07-04 — End: ?

## 2019-07-04 MED ORDER — METOPROLOL SUCCINATE ER 50 MG TABLET,EXTENDED RELEASE 24 HR
ORAL_TABLET | Freq: Every day | ORAL | 3 refills | 90 days | Status: CP
Start: 2019-07-04 — End: 2019-07-04

## 2019-07-04 MED ORDER — EVOLOCUMAB 140 MG/ML SUBCUTANEOUS PEN INJECTOR
INJECTION | SUBCUTANEOUS | 3 refills | 0 days | Status: CP
Start: 2019-07-04 — End: ?
  Filled 2019-07-11: qty 6, 84d supply, fill #0

## 2019-07-04 MED ORDER — ISOSORBIDE MONONITRATE ER 30 MG TABLET,EXTENDED RELEASE 24 HR
ORAL_TABLET | Freq: Every day | ORAL | 3 refills | 90 days | Status: CP
Start: 2019-07-04 — End: ?

## 2019-07-04 NOTE — Unmapped (Signed)
Madison Harris 's OTREXUP shipment will be delayed as a result of a different copay.      I have reached out to the patient and left a voicemail message.  We will wait for a call back from the patient to reschedule the delivery.  We have not confirmed the new delivery date.

## 2019-07-04 NOTE — Unmapped (Signed)
Stop taking Metoprolol XL 25 mg  Start taking Carvedilol 12.5 mg twice daily  Monitor your blood pressure at home and log some measures to bring to your next visit  Follow up in 6 months

## 2019-07-04 NOTE — Unmapped (Signed)
Cardiology Follow up visit      Requesting Provider: Virgilio Frees*   Primary Provider: Doroteo Bradford, MD     Reason for Consult:   Routine follow up     Assessment & Plan:  1. 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  Angina and lipid regimen is currently optimized.    -Continue PCSK9 inhibitor  -Continue ASA daily  -Stop metoprolol 50 mg daily  -Start carvedilol 12.5 mg BID with plan to titrate to 25 mg BID as this could aid with glycemic and BP control  -Continue imdur 30    2. HTN: A bit above control range today (147/70)  -Continue lisinopril 40  - Stop metoprolol and start carvedilol as above  -Home monitoring, BP diary log    Return in 6 months for routine follow up    The patient was discussed and seen with Dr. Andrey Farmer who agreed with above assessment and plan.    Leonor Liv, MD  Neurology resident PGY-1    History of Present Illness:  Madison Harris is a 64 y.o. female with a history of coronary artery disease, who has been referred for evaluation of CAD.    She first presented to cardiology attention in 2012 at which point she had an anterior MI at [facility].  She underwent a DES and a staged stent to an LPL branch.  Since then she had done well until March of 2018 when she presented with chest pain to Dr Clifton James for chest discomfort.  She underwent PCI to her OM3 with symptom resolution.  She has comorbid DM, HTN, HLD with statin intolerance, now on evolocumab with excellent LDL response.  She had severe myalgias on multiple statins.  She also has RA and was started on MTX a couple months ago and is actively titrating down on prednisone, now 9mg  daily.  She has had to switch to [facility] for insurance purposes and is here for a routine follow up. Since her las visit she reports doing very weel. She denies any chest pain or dyspnea with exertion. She denies orthopnea, lower extremity edema, palpitations, pre-syncope or syncope.    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  She exercises one or twice weekly by walking or horseback riding without difficulty.    Echo 09/14/2016 Normal LV/RV valvular function    Social History:  Social History     Socioeconomic History   ??? Marital status: Not on file     Spouse name: Not on file   ??? Number of children: Not on file   ??? Years of education: Not on file   ??? Highest education level: Not on file   Occupational History   ??? Occupation: IT consultant   Social Needs   ??? Financial resource strain: Not on file   ??? Food insecurity     Worry: Not on file     Inability: Not on file   ??? Transportation needs     Medical: Not on file     Non-medical: Not on file   Tobacco Use   ??? Smoking status: Never Smoker   ??? Smokeless tobacco: Never Used   Substance and Sexual Activity   ??? Alcohol use: Yes   ??? Drug use: Never   ??? Sexual activity: Not on file   Lifestyle   ??? Physical activity     Days  per week: Not on file     Minutes per session: Not on file   ??? Stress: Not on file   Relationships   ??? Social Wellsite geologist on phone: Not on file     Gets together: Not on file     Attends religious service: Not on file     Active member of club or organization: Not on file     Attends meetings of clubs or organizations: Not on file     Relationship status: Not on file   Other Topics Concern   ??? Not on file   Social History Narrative   ??? Not on file       Family History:  Family History   Problem Relation Age of Onset   ??? Cancer Mother    ??? Cancer Brother    ??? Cancer Maternal Grandmother    ??? Diabetes Maternal Grandmother    ??? Heart disease Maternal Grandmother    ??? Hypertension Maternal Grandmother        Review of Systems:  A 12-system review of systems was performed and was negative except as noted in the HPI.    Physical Exam: VITAL SIGNS:   Vitals:    07/04/19 1434   BP: 140/70   Pulse: 80   Temp: 36.1 ??C   SpO2: 95%   Weight: 75.8 kg (167 lb)   Height: 154.9 cm (5' 1)     Body mass index is 31.55 kg/m??.  GENERAL: Well appearing NAD   HEENT: PERRL, EOMI, OP clear with MMM.   NECK: Supple without LAD, TM, JVD, or HJR.  RESPIRATORY: CTA bilaterally without wheezes or crackles.  Normal WOB.  CARDIOVASCULAR: RRR without m/r/g.  GASTROINTESTINAL: NABS present.  Soft, NT/ND without HSM.MUSCULOSKELETAL  / EXTREMITIES:  No LE edema.  2+ radial, PT, and DP pulses bilaterally

## 2019-07-05 DIAGNOSIS — I2510 Atherosclerotic heart disease of native coronary artery without angina pectoris: Principal | ICD-10-CM

## 2019-07-05 DIAGNOSIS — E782 Mixed hyperlipidemia: Principal | ICD-10-CM

## 2019-07-05 MED FILL — OTREXUP (PF) 20 MG/0.4 ML SUBCUTANEOUS AUTO-INJECTOR: 84 days supply | Qty: 5 | Fill #3 | Status: AC

## 2019-07-05 MED FILL — OTREXUP (PF) 20 MG/0.4 ML SUBCUTANEOUS AUTO-INJECTOR: SUBCUTANEOUS | 84 days supply | Qty: 4.8 | Fill #3

## 2019-07-05 NOTE — Unmapped (Signed)
Madison Harris 's otrexup shipment will be sent out  as a result of prior authorization now approved.     I have reached out to the patient and communicated the delivery change. We will reschedule the medication for the delivery date that the patient agreed upon.  We have confirmed the delivery date as 07/08/19, via ups.

## 2019-07-09 NOTE — Unmapped (Signed)
[facility] Specialty Medication Onboarding    Specialty Medication: REPATHA SURECLICK 140MG /ML PENS  Prior Authorization: Not Required   Financial Assistance: Yes - copay card approved as secondary   Final Copay/Day Supply: $15 / 84 DAYS    Insurance Restrictions: None     Notes to Pharmacist: PATIENT ALSO ENROLLED IN RHEUM QUEUE    The triage team has completed the benefits investigation and has determined that the patient is able to fill this medication at [facility]. Please contact the patient to complete the onboarding or follow up with the prescribing physician as needed.

## 2019-07-10 NOTE — Unmapped (Signed)
[facility]   Patient Onboarding/Medication Counseling    Madison Harris is a 64 y.o. female with hyperlipidemia who I am counseling today on initiation of therapy.  I am speaking to the patient.    Was a Nurse, learning disability used for this call? No    Verified patient's date of birth / HIPAA.    Specialty medication(s) to be sent: General Specialty: Repatha      Non-specialty medications/supplies to be sent: carvedilol      Medications not needed at this time: none         Repatha (evolocumab)    Medication & Administration     Dosage: Inject the contents of 1 pen (140mg ) under the skin every 2 weeks.    Administration: Administer under the skin of the abdomen, thigh or upper arm. Rotate sites with each injection.  ? Injection instructions - Autoinjector:  o Remove 1 Repatha autoinjector from the refrigerator and let stand at room temperature for at least 30 minutes.  o Check the autoinjector for the following:  ? Expiration date  ? Absence of any cracks or damage  ? The medicine is clear and colorless and does not contain any particles  ? The orange cap is present and securely attached  o Choose your injection site and clean with an alcohol wipe. Allow to air dry completely.  o Pull the orange cap straight off and discard  o Pinch the skin (or stretch) with your thumb and fingers creating an area 2 inches wide  o Maintaining the pinch (or stretch) press the pen to your skin at a 90 degree angle. Firmly push the autoinjector down until the skin stops moving and the yellow safety guard is no longer visible.  o Do not touch the gray start button yet  o When you are ready to inject, press the gray start button. You will hear a click that signals the start of the injection  o Continue to press the pen to your skin and lift your thumb  o The injection may take up to 15 seconds. You will know the injection is complete when the medication window turns yellow. You may also hear a second click. o Remove the pen from your skin and discard the pen in a sharps container.  o If there is blood at the injection site, press a cotton ball or gauze to the site. Do not rub the injection site.    Adherence/Missed dose instructions: Administer a missed dose within 7 days and resume your normal schedule.  If it has been more than 7 days and you inject every 2 weeks, skip the missed dose and resume your normal schedule..     Goals of Therapy     Lower cholesterol, prevention of cardiovascular events in patients with established cardiovascular disease    Side Effects & Monitoring Parameters   ? Flu-like symptoms  ? Signs of a common cold  ? Back pain  ? Injection site irritation  ? Nose or throat irritation    The following side effects should be reported to the provider:  ? Signs of an allergic reaction  ? Signs of high blood sugar (confusion, drowsiness, increase thirst/hunger/urination, fast breathing, flushing)      Contraindications, Warnings, & Precautions     ? Latex (the packaging of Repatha may contain natural rubber)    Drug/Food Interactions     ? Medication list reviewed in Epic. The patient was instructed to inform the care team before taking  any new medications or supplements. No drug interactions identified.     Storage, Handling Precautions, & Disposal   ? Repatha should be stored in the refrigerator. If necessary, Repatha may be kept at room temperature for no more than 30 days.  ? Place used devices in a sharps container for disposal.      Current Medications (including OTC/herbals), Comorbidities and Allergies     Current Outpatient Medications   Medication Sig Dispense Refill   ??? aspirin (ECOTRIN) 81 MG tablet Take 81 mg by mouth.     ??? blood sugar diagnostic Strp by Other route daily. 200 each 2   ??? blood-glucose meter kit Check blood sugar daily. 1 each 0   ??? canagliflozin (INVOKANA) 300 mg Tab tablet Take 1 tablet (300 mg total) by mouth daily with evening meal. 90 tablet 3 ??? carvediloL (COREG) 12.5 MG tablet Take 1 tablet (12.5 mg total) by mouth 2 (two) times a day with meals. 180 tablet 3   ??? ergocalciferol, vitamin D2, (VITAMIN D2 ORAL) Take by mouth.     ??? evolocumab 140 mg/mL PnIj Inject the contents of 1 pen (140 mg) under the skin every fourteen (14) days. 6 mL 3   ??? folic acid (FOLVITE) 1 MG tablet Take 1 mg by mouth.     ??? isosorbide mononitrate (IMDUR) 30 MG 24 hr tablet Take 1 tablet (30 mg total) by mouth daily. 90 tablet 3   ??? lancets (FREESTYLE) 28 gauge Misc USE AS INSTRUCTED 200 each 2   ??? lisinopriL (PRINIVIL,ZESTRIL) 40 MG tablet Take 1 tablet (40 mg total) by mouth daily. 90 tablet 3   ??? metFORMIN (GLUCOPHAGE-XR) 500 MG 24 hr tablet TAKE 2 TABLETS BY MOUTH EVERY MORNING AND 1 TABLET NIGHTLY GENERIC EQUIVALENT FOR GLUCOPHAGE- XR (Patient taking differently: 1,000 mg two (2) times a day. ) 270 tablet 0   ??? methotrexate, PF, (OTREXUP, PF,) 20 mg/0.4 mL AtIn Inject the contents of 1 pen (20 mg) under the skin every seven (7) days. 4.8 mL 3   ??? nitroglycerin (NITROSTAT) 0.4 MG SL tablet PLACE 1 TABLET UNDER THE TONGUE EVERY 5 MINUTES AS NEEDED FOR CHEST PAIN UP TO 3 DOSES THEN CALL 911 IF NO RELIEF     ??? pen needle, diabetic 31 gauge x 5/16 (8 mm) Ndle Use 1-3 times daily as directed. 100 each 3   ??? predniSONE (DELTASONE) 1 MG tablet Take 1-4 pills daily as directed for taper (Patient taking differently: Take 6 mg by mouth daily. Take 1-4 pills daily as directed for taper) 360 tablet 3   ??? TOUJEO SOLOSTAR U-300 INSULIN 300 unit/mL (1.5 mL) injection pen INJECT 25 UNITS UNDER THE SKIN NIGHTLY (Patient taking differently: 28 Units nightly. ) 4.5 mL 0   ??? venlafaxine (EFFEXOR-XR) 75 MG 24 hr capsule Take 1 capsule (75 mg total) by mouth daily. 90 capsule 3     No current facility-administered medications for this visit.        Allergies   Allergen Reactions   ??? Pravastatin Muscle Pain     Other Reaction: myalgia   ??? Semaglutide Diarrhea       Patient Active Problem List Diagnosis   ??? Personal history of noncompliance with medical treatment, presenting hazards to health   ??? T2DM, uncontrolled, on insulin   ??? CAD s/p PCI   ??? Essential hypertension   ??? Mixed hyperlipidemia   ??? PMR (polymyalgia rheumatica) (CMS-HCC)   ??? Current chronic use of systemic steroids   ???  OSA on CPAP   ??? B12 deficiency       Reviewed and up to date in Epic.    Appropriateness of Therapy     Is medication and dose appropriate based on diagnosis? Yes    Prescription has been clinically reviewed: Yes    Baseline Quality of Life Assessment      How many days over the past month did your hyperlipidemia keep you from your normal activities? 0    Financial Information     Medication Assistance provided: Prior Authorization and Copay Assistance    Anticipated copay of $15.00/84 days reviewed with patient. Verified delivery address.    Delivery Information     Scheduled delivery date: 07/12/19    Expected start date: 07/12/19    Medication will be delivered via UPS to the prescription address [facility].  This shipment will not require a signature.      Explained the services we provide at [facility] and that each month we would call to set up refills.  Stressed importance of returning phone calls so that we could ensure they receive their medications in time each month.  Informed patient that we should be setting up refills 7-10 days prior to when they will run out of medication.  A pharmacist will reach out to perform a clinical assessment periodically.  Informed patient that a welcome packet and a drug information handout will be sent.      Patient verbalized understanding of the above information as well as how to contact the pharmacy at [phone] option 4 with any questions/concerns.  The pharmacy is open Monday through Friday 8:30am-4:30pm.  A pharmacist is available 24/7 via pager to answer any clinical questions they may have.    Patient Specific Needs ? Does the patient have any physical, cognitive, or cultural barriers? No    ? Patient prefers to have medications discussed with  Patient     ? Is the patient or caregiver able to read and understand education materials at a high school level or above? Yes    ? Patient's primary language is  English     ? Is the patient high risk? No     ? Does the patient require a Care Management Plan? No     ? Does the patient require physician intervention or other additional services (i.e. nutrition, smoking cessation, social work)? No      Arnold Long  [facility] Specialty Pharmacist

## 2019-07-11 MED FILL — REPATHA SURECLICK 140 MG/ML SUBCUTANEOUS PEN INJECTOR: 84 days supply | Qty: 6 | Fill #0 | Status: AC

## 2019-07-11 MED FILL — CARVEDILOL 12.5 MG TABLET: 90 days supply | Qty: 180 | Fill #0 | Status: AC

## 2019-07-26 ENCOUNTER — Encounter: Admit: 2019-07-26 | Discharge: 2019-07-27 | Payer: PRIVATE HEALTH INSURANCE

## 2019-07-26 DIAGNOSIS — I2510 Atherosclerotic heart disease of native coronary artery without angina pectoris: Principal | ICD-10-CM

## 2019-07-26 DIAGNOSIS — E1165 Type 2 diabetes mellitus with hyperglycemia: Principal | ICD-10-CM

## 2019-07-26 DIAGNOSIS — Z794 Long term (current) use of insulin: Principal | ICD-10-CM

## 2019-07-26 DIAGNOSIS — E538 Deficiency of other specified B group vitamins: Principal | ICD-10-CM

## 2019-07-26 DIAGNOSIS — E782 Mixed hyperlipidemia: Principal | ICD-10-CM

## 2019-07-26 DIAGNOSIS — I10 Essential (primary) hypertension: Principal | ICD-10-CM

## 2019-07-26 LAB — LIPID PANEL
CHOLESTEROL/HDL RATIO SCREEN: 1.7
HDL CHOLESTEROL: 69 mg/dL — ABNORMAL HIGH (ref 40–59)
LDL CHOLESTEROL CALCULATED: 21 mg/dL
TRIGLYCERIDES: 120 mg/dL (ref 1–149)

## 2019-07-26 LAB — COMPREHENSIVE METABOLIC PANEL
ALBUMIN: 4.4 g/dL (ref 3.5–5.0)
ALKALINE PHOSPHATASE: 52 U/L (ref 38–126)
AST (SGOT): 18 U/L (ref 14–38)
BILIRUBIN TOTAL: 0.5 mg/dL (ref 0.0–1.2)
BLOOD UREA NITROGEN: 20 mg/dL (ref 7–21)
BUN / CREAT RATIO: 27
CALCIUM: 9.2 mg/dL (ref 8.5–10.2)
CHLORIDE: 105 mmol/L (ref 98–107)
CREATININE: 0.73 mg/dL (ref 0.60–1.00)
EGFR CKD-EPI AA FEMALE: >90 mL/min/{1.73_m2} (ref >=60)
EGFR CKD-EPI NON-AA FEMALE: 89 mL/min/{1.73_m2} (ref >=60)
GLUCOSE RANDOM: 144 mg/dL (ref 70–179)
POTASSIUM: 4.3 mmol/L (ref 3.5–5.0)
PROTEIN TOTAL: 6.7 g/dL (ref 6.5–8.3)
SODIUM: 142 mmol/L (ref 135–145)

## 2019-07-26 LAB — NON-HDL CHOLESTEROL: Cholesterol.non HDL:MCnc:Pt:Ser/Plas:Qn:: 45

## 2019-07-26 LAB — ALBUMIN / CREATININE URINE RATIO
ALBUMIN QUANT URINE: 1.6 mg/dL
CREATININE, URINE: 76.5 mg/dL

## 2019-07-26 LAB — HEMOGLOBIN A1C
ESTIMATED AVERAGE GLUCOSE: 157 mg/dL
Hemoglobin A1c/Hemoglobin.total:MFr:Pt:Bld:Qn:: 7.1 — ABNORMAL HIGH

## 2019-07-26 LAB — CREATININE, URINE: Lab: 76.5

## 2019-07-26 LAB — CHLORIDE: Chloride:SCnc:Pt:Ser/Plas:Qn:: 105

## 2019-07-26 LAB — VITAMIN B-12: Cobalamins:MCnc:Pt:Ser/Plas:Qn:: 236

## 2019-08-01 ENCOUNTER — Encounter
Admit: 2019-08-01 | Discharge: 2019-08-02 | Payer: PRIVATE HEALTH INSURANCE | Attending: Internal Medicine | Primary: Internal Medicine

## 2019-08-01 ENCOUNTER — Encounter: Admit: 2019-08-01 | Discharge: 2019-08-02 | Payer: PRIVATE HEALTH INSURANCE

## 2019-08-01 DIAGNOSIS — Z794 Long term (current) use of insulin: Secondary | ICD-10-CM

## 2019-08-01 DIAGNOSIS — I2510 Atherosclerotic heart disease of native coronary artery without angina pectoris: Principal | ICD-10-CM

## 2019-08-01 DIAGNOSIS — I10 Essential (primary) hypertension: Principal | ICD-10-CM

## 2019-08-01 DIAGNOSIS — E1165 Type 2 diabetes mellitus with hyperglycemia: Principal | ICD-10-CM

## 2019-08-01 DIAGNOSIS — G4733 Obstructive sleep apnea (adult) (pediatric): Principal | ICD-10-CM

## 2019-08-01 DIAGNOSIS — E782 Mixed hyperlipidemia: Principal | ICD-10-CM

## 2019-08-01 DIAGNOSIS — Z0000 Encounter for general adult medical examination without abnormal findings: Principal | ICD-10-CM

## 2019-08-01 DIAGNOSIS — E538 Deficiency of other specified B group vitamins: Principal | ICD-10-CM

## 2019-08-01 NOTE — Unmapped (Signed)
[facility]  [address] [place], [place] [address]  Ph: [phone], F: [phone]            ASSESSMENT & PLAN   Madison Harris was seen today for routine follow-up.    Diagnoses and all orders for this visit:    Annual physical exam  Lab results were reviewed in detail with the patient.  Lifestyle issues discussed (including diet/sleep hygiene/exercise/stress management/sexual health/substance use as appropriate), and regular dental and eye exams were encouraged.  Immunizations and health maintenance (cancer screening) all reviewed and ordered as indicated in plan.  Patient declined further CRC screening at this time - we did discuss alternatives to [facility] but she continues to decline.  Signs and symptoms that should prompt return sooner than scheduled were reviewed with the patient.    CAD s/p PCI  Essential hypertension  T2DM, uncontrolled, on insulin  Mixed hyperlipidemia  Her blood pressure remains close to goal.  She was recently switched to carvedilol from metoprolol by her cardiologist so I suspect she will get some additional blood pressure control from that.  Her lipids are at goal on her current regimen.  She remains symptom free from a cardiovascular standpoint.  I did reinforce the importance of CPAP compliance see below.  Blood sugar has improved exponentially since restarting insulin.  I still think we can get her off of some of her insulin as she comes down on her prednisone dose.    B12 deficiency  Her B12 level is still quite low.  I will recheck this and if it remains low we will go ahead and treat with IM B12.    OSA (obstructive sleep apnea)  Reiterated importance of compliance with CPAP for CV disease.      Note: Today's visit addressed both an Annual Preventive Exam as documented as well as the separately identifiable problem-based evaluation and management of the diagnoses noted above.      HPI     Chief Complaint   Patient presents with   ??? Routine Follow-up Madison Harris is a 64 y.o. female in today for an annual exam.  Additionally, she is following up her chronic medical problems diabetes, CAD status post PCI, hypertension, hyperlipidemia, B12 deficiency.  I last had a visit with her on 05/29/2019.  At that point she had side effects from semaglutide.  We stopped her semaglutide and restarted her Toujeo insulin 28 units in addition to Metformin and Invokana.  She remains on Praluent, Imdur, lisinopril.    She has since stopped using her CPAP because it was annoying.  She did notice an improvement to her sleep and energy level when she was using it but has not had enough of the detriment without a to continue using it.    Review of Systems   Constitutional: Negative.    HENT: Negative.    Eyes: Negative.    Respiratory: Negative.    Cardiovascular: Negative.    Gastrointestinal: Negative.    Endocrine: Negative.    Genitourinary: Negative.    Musculoskeletal: Positive for arthralgias.   Neurological: Negative.    Hematological: Negative.    Psychiatric/Behavioral: Negative.        I have reviewed and updated the past medical, social, and family history today and updated them in the EMR.    Allergies   Allergen Reactions   ??? Pravastatin Muscle Pain     Other Reaction: myalgia   ??? Semaglutide Diarrhea     Outpatient Medications  Prior to Visit   Medication Sig Dispense Refill   ??? aspirin (ECOTRIN) 81 MG tablet Take 81 mg by mouth.     ??? blood sugar diagnostic Strp by Other route daily. 200 each 2   ??? blood-glucose meter kit Check blood sugar daily. 1 each 0   ??? canagliflozin (INVOKANA) 300 mg Tab tablet Take 1 tablet (300 mg total) by mouth daily with evening meal. 90 tablet 3   ??? carvediloL (COREG) 12.5 MG tablet Take 1 tablet (12.5 mg total) by mouth 2 (two) times a day with meals. 180 tablet 3   ??? ergocalciferol, vitamin D2, (VITAMIN D2 ORAL) Take by mouth.     ??? evolocumab 140 mg/mL PnIj Inject the contents of 1 pen (140 mg) under the skin every fourteen (14) days. 6 mL 3   ??? folic acid (FOLVITE) 1 MG tablet Take 1 mg by mouth.     ??? isosorbide mononitrate (IMDUR) 30 MG 24 hr tablet Take 1 tablet (30 mg total) by mouth daily. 90 tablet 3   ??? lancets (FREESTYLE) 28 gauge Misc USE AS INSTRUCTED 200 each 2   ??? lisinopriL (PRINIVIL,ZESTRIL) 40 MG tablet Take 1 tablet (40 mg total) by mouth daily. 90 tablet 3   ??? metFORMIN (GLUCOPHAGE-XR) 500 MG 24 hr tablet TAKE 2 TABLETS BY MOUTH EVERY MORNING AND 1 TABLET NIGHTLY GENERIC EQUIVALENT FOR GLUCOPHAGE- XR (Patient taking differently: 1,000 mg two (2) times a day. ) 270 tablet 0   ??? methotrexate, PF, (OTREXUP, PF,) 20 mg/0.4 mL AtIn Inject the contents of 1 pen (20 mg) under the skin every seven (7) days. 4.8 mL 3   ??? nitroglycerin (NITROSTAT) 0.4 MG SL tablet PLACE 1 TABLET UNDER THE TONGUE EVERY 5 MINUTES AS NEEDED FOR CHEST PAIN UP TO 3 DOSES THEN CALL 911 IF NO RELIEF     ??? pen needle, diabetic 31 gauge x 5/16 (8 mm) Ndle Use 1-3 times daily as directed. 100 each 3   ??? predniSONE (DELTASONE) 1 MG tablet Take 1-4 pills daily as directed for taper (Patient taking differently: Take 6 mg by mouth daily. Take 1-4 pills daily as directed for taper) 360 tablet 3   ??? TOUJEO SOLOSTAR U-300 INSULIN 300 unit/mL (1.5 mL) injection pen INJECT 25 UNITS UNDER THE SKIN NIGHTLY (Patient taking differently: 28 Units nightly. ) 4.5 mL 0   ??? venlafaxine (EFFEXOR-XR) 75 MG 24 hr capsule Take 1 capsule (75 mg total) by mouth daily. 90 capsule 3     No facility-administered medications prior to visit.      Past Medical History:   Diagnosis Date   ??? Cataract    ??? Diabetes mellitus (CMS-HCC)     Type 2    ??? Hyperlipidemia    ??? Hypertension      Past Surgical History:   Procedure Laterality Date   ??? CARDIAC SURGERY      2 stents.    ??? HYSTERECTOMY       Family History   Problem Relation Age of Onset   ??? Cancer Mother    ??? Cancer Brother    ??? Cancer Maternal Grandmother    ??? Diabetes Maternal Grandmother    ??? Heart disease Maternal Grandmother ??? Hypertension Maternal Grandmother    ??? Ovarian cancer Neg Hx    ??? Breast cancer Neg Hx      Social History     Tobacco Use   ??? Smoking status: Never Smoker   ??? Smokeless tobacco: Never  Used   Substance Use Topics   ??? Alcohol use: Yes   ??? Drug use: Never         Social History     Social History Narrative   ??? Not on file       EXAM & DATA   T 36.6 ??C (97.8 ??F) - P 74 - RR   - SpO2 97%  BP   BP Readings from Last 3 Encounters:   08/01/19 132/68   07/04/19 140/70   04/25/19 112/70     Wt   Wt Readings from Last 3 Encounters:   07/04/19 75.8 kg (167 lb)   01/23/19 74.2 kg (163 lb 8 oz)   01/15/19 74.8 kg (165 lb)     PHQ-2 Score:  PHQ-2 Total Score : 0    Physical Exam   Constitutional: She is oriented to person, place, and time. She appears well-developed. No distress.   HENT:   Head: Normocephalic and atraumatic.   Right Ear: External ear normal.   Left Ear: External ear normal.   Eyes: Pupils are equal, round, and reactive to light. Conjunctivae and EOM are normal. No scleral icterus.   Neck: Normal range of motion. Neck supple. No thyromegaly present.   Cardiovascular: Normal rate, regular rhythm, normal heart sounds and intact distal pulses. Exam reveals no friction rub.   No murmur heard.  Pulmonary/Chest: Effort normal and breath sounds normal. No respiratory distress. She has no wheezes. She has no rales.   Abdominal: Soft. Bowel sounds are normal. She exhibits no distension. There is no abdominal tenderness. There is no rebound and no guarding.   Musculoskeletal: Normal range of motion.         General: No edema.   Lymphadenopathy:     She has no cervical adenopathy.   Neurological: She is alert and oriented to person, place, and time. Coordination normal.   Skin: Skin is warm and dry. No rash noted.   Psychiatric: She has a normal mood and affect. Her behavior is normal. Judgment and thought content normal.   Vitals reviewed.      Data Reviewed:  Lab Results   Component Value Date    NA 142 07/26/2019    K 4.3 07/26/2019    BUN 20 07/26/2019    CREATININE 0.73 07/26/2019    A1C 7.1 (H) 07/26/2019    CHOL 114 07/26/2019    LDL 21 07/26/2019    HDL 69 (H) 07/26/2019    TRIG 120 07/26/2019    ALT 17 07/26/2019       Lab Results   Component Value Date    A1C 7.1 (H) 07/26/2019    A1C 7.8 (H) 04/25/2019    A1C 9.0 (H) 01/23/2019    A1C 7.9 (H) 11/14/2018    A1C 8.4 (H) 07/25/2018     Lab Results   Component Value Date    TRIG 120 07/26/2019    TRIG 131 07/25/2018     Lab Results   Component Value Date    LDL 21 07/26/2019    LDL 38.8 07/25/2018     Lab Results   Component Value Date    ALT 17 07/26/2019    ALT 18 04/25/2019    ALT 21 01/23/2019    ALT 17 11/14/2018    ALT 27.0 07/25/2018     No results found for: APOB  No results found for: HSCRP      ----------------------------------------------------------------------------------------------------------------------  This note was dictated with Dragon Medical and  may contain typos missed during proofreading.

## 2019-08-01 NOTE — Unmapped (Signed)
WELLNESS PLAN    COVID 19 Vaccine:  Scheduling will be available via [email or web address], phone and My [facility] Chart.     Health Maintenance Screenings and Vaccines:     Health Maintenance   Topic Date Due   ??? Foot Exam  10/09/1967   ??? DTaP/Tdap/Td Vaccines (1 - Tdap) 10/08/1976   ??? Pneumococcal Vaccine (1 of 1 - PPSV23) 10/08/1976   ??? Colonoscopy  10/09/2007   ??? Zoster Vaccines (1 of 2) 10/09/2007   ??? Mammogram Start Age 64  06/21/2019   ??? Hemoglobin A1c  01/23/2020   ??? Retinal Eye Exam  04/15/2020   ??? Urine Albumin/Creatinine Ratio  07/25/2020   ??? Serum Creatinine Monitoring  07/25/2020   ??? Potassium Monitoring  07/25/2020   ??? Lipid Screening  07/25/2024   ??? Hepatitis C Screen  Completed   ??? Influenza Vaccine  Completed        Vaccines:      Immunization History   Administered Date(s) Administered   ??? Influenza Vaccine Quad (IIV4 PF) 39mo+ injectable 04/26/2013, 04/25/2014, 04/22/2015, 07/25/2018, 04/25/2019     ?? Please get a flu shot every year in August or September to avoid serious illness.  ?? We also recommend the shingles vaccine (Shingrix) for most people after age 18. It is a series of two shots given 2 months apart. Side effects are relatively common and may be worse with the second shot. We reviewed potential side effects. This vaccine is available in most pharmacies if you call and ask.    Cancer screenings:   ?? Colorectal cancer screening: Some type of screening is recommended for everyone up to age 35, depending on past history of polyps or cancer.   ?? Breast cancer screening: Recommended for most women every 1-2 years from ages 33-74.   ?? Cervical cancer screening: Recommended for most women through age 76, frequency depending on previous results.      Other screenings:  ?? Osteoporosis: A bone density test is recommended for women aged 24 and older, or younger with risk factors for thin bones.   ?? Hepatitis C: Once for everyone 18+.   ?? Abdominal aortic aneurysm (AAA) screening: One-time ultrasound test for men aged 41-75 who have ever smoked.   ?? Lung Cancer:  Low-dose chest CT: At age 10-79 if you have smoked for at least 30 years, and are either still smoking or who quit in the past 15 years.        Wellness tips and recommendations for improvement:   ?? Wear your seatbelt and avoid distracted driving (I.e. texting)  ?? Get adequate sleep.   ?? Increase enjoyable activities and try stress management techniques, like walking and relaxed breathing.    Exercise: try to get at least 30 minutes daily (or 150 minutes weekly) -- a combination of cardio, strength training, and stretching/balance is best.      Diet: Eat a mostly plant-based diet, low in animal fats and refined carbohydrates.  1. Do not drink any calories.   o This includes any drinks with calories (sugar).  o Examples of drinks to avoid: soft drinks/soda, sweet tea, gatorade, energy drinks, and lemonade, and fruit juices.  o Sugar Free is better if you want to switch to that.  o If you aren't sure, look at the nutrition label. If it has any calories OR sugar, it is harmful to your health.  2. Limit your desserts to one regular-sized serving per week.  o This includes anything that  is sweet.   o Examples to avoid: cakes, cookies, candy, pie, pudding, muffins, cupcakes, ice cream, etc.  3. Limit your carbohydrates servings to one per day.  Use whole grain, whole wheat as opposed to white  o This includes non-sweet carbohydrates.  o Examples to always avoid: grains (even whole grains or whole wheat), rice, cereal, flour, corn, bread, pasta, muffins, bagels, crackers, pinto/lima beans, carrots, potatoes, french fries, potato chips, croissants, pastries, tortillas  4. Vegetables, fruits, legumes, and non-caloric beverages (water, coffee, unsweetened tea) should comprise the majority of dietary intake.  Eat fish, especially fatty fish (e.g. salmon) twice a week. Use oils high in polyunsaturated fat (e.g., safflower, corn oil) and avoid foods with trans fats from partially hydrogenated oils (read the label). Choose whole kernel grains (e.g., dark rye bread, high fiber cereal), and watch out for ???whole grain??? products with added sugars.    Other:  ? Dentist: I recommend twice yearly visits to the dentist for preventive cleanings  ? Dermatologist: every few years if you have never had any skin issues and more frequently if you have  ? Ophthalmologist: at least every 2-3 years, or more frequent depending on your history

## 2019-09-02 MED ORDER — METFORMIN ER 500 MG TABLET,EXTENDED RELEASE 24 HR
ORAL_TABLET | 0 refills | 0 days | Status: CP
Start: 2019-09-02 — End: ?

## 2019-09-05 ENCOUNTER — Telehealth: Admit: 2019-09-05 | Discharge: 2019-09-06 | Payer: PRIVATE HEALTH INSURANCE

## 2019-09-05 NOTE — Unmapped (Signed)
Patient Name: Madison Harris  PCP: ??Doroteo Bradford, MD  Source of History: patient and records  Date of Visit: 09/05/19 2:18 PM    Identification: Pt self identified using name and date of birth  Patient location: [place], [phone]  The limitations of this telemedicine encounter were discussed with patient. Both the patient and myself agreed to this encounter despite these limitations. Benefits of this telemedicine encounter included allowing for continued care of patient and minimizing risk of exposure to COVID-19. Patient also aware that this is a billable encounter with possible copay.     REASON FOR VISIT:  Follow-up for RA (+CCP) vs PMR    PRIOR RHEUMATOLOGIC HISTORY:?? per records, initially thought to be PMR but could not taper off prednisone. RF neg but CCP 40's. RA management with methotrexate. [facility].    Patient initially presented to NP Wallace Cullens at [facility] a year ago. Symptoms started 3-4 months ago before she saw NP Wallace Cullens. She had trouble walking and moving her neck as well as hips. She reports the hips were so stiff so she could not walk and had gelling phenomena. She had initially complained of symptoms to her endocrinologist for DM who felt her blood sugars were not well controlled.  Due to insurance issues, she needs to switch providers to [facility]. She has been tapering prednisone and will drop to 5 mg daily today. She has been on methotrexate 15 mg [place] qweek since November 2019.    HPI: Madison Harris is a 64 y.o. female who I initially met in April 2020 virtually to establish rheumatologic care for probable RA vs PMR. Mtx increased to 20 mg at that visit. Repeat RF and CCP were negative.  She completed a televisit in July 2020 with Carlus Pavlov [facility] for follow-up to continue prednisone taper to 3 mg daily.  Patient presents today for follow-up by video visit.    Today, patient reports that she did taper down to 3 mg daily for the prednisone but was symptomatic so was told to return to prednisone 4 mg daily. She is having lots of breakthrough with pain more so than stiffness. She complains of thumbs, hips. She has trouble with her right hip. She does feel like methotrexate is helpful because if it she forgets she has more pain and stiffness. Hip pain is worse anteriorly, right more than left.    ROS??: Attests to the above, otherwise, review of all other systems is negative.  ????  Past Medical and Surgical History:  ??  Patient Active Problem List    Diagnosis Date Noted   ??? B12 deficiency 04/25/2019   ??? PMR (polymyalgia rheumatica) (CMS-HCC) 01/23/2019   ??? Current chronic use of systemic steroids 01/23/2019   ??? OSA (obstructive sleep apnea) 01/23/2019   ??? T2DM, uncontrolled, on insulin 07/25/2018   ??? CAD s/p PCI 07/25/2018   ??? Essential hypertension 07/25/2018   ??? Mixed hyperlipidemia 07/25/2018   ??? Personal history of noncompliance with medical treatment, presenting hazards to health 04/04/2016     Past Surgical History:   Procedure Laterality Date   ??? CARDIAC SURGERY      2 stents.    ??? HYSTERECTOMY       Allergies:   ??  Allergies   Allergen Reactions   ??? Pravastatin Muscle Pain     Other Reaction: myalgia   ??? Semaglutide Diarrhea     Current Outpatient Medications:  ??  Current Outpatient Medications on File Prior to Visit  Medication Sig Dispense Refill   ??? aspirin (ECOTRIN) 81 MG tablet Take 81 mg by mouth.     ??? blood sugar diagnostic Strp by Other route daily. 200 each 2   ??? blood-glucose meter kit Check blood sugar daily. 1 each 0   ??? canagliflozin (INVOKANA) 300 mg Tab tablet Take 1 tablet (300 mg total) by mouth daily with evening meal. 90 tablet 3   ??? carvediloL (COREG) 12.5 MG tablet Take 1 tablet (12.5 mg total) by mouth 2 (two) times a day with meals. 180 tablet 3   ??? ergocalciferol, vitamin D2, (VITAMIN D2 ORAL) Take by mouth.     ??? evolocumab 140 mg/mL PnIj Inject the contents of 1 pen (140 mg) under the skin every fourteen (14) days. 6 mL 3 ??? folic acid (FOLVITE) 1 MG tablet Take 1 mg by mouth.     ??? isosorbide mononitrate (IMDUR) 30 MG 24 hr tablet Take 1 tablet (30 mg total) by mouth daily. 90 tablet 3   ??? lancets (FREESTYLE) 28 gauge Misc USE AS INSTRUCTED 200 each 2   ??? lisinopriL (PRINIVIL,ZESTRIL) 40 MG tablet Take 1 tablet (40 mg total) by mouth daily. 90 tablet 3   ??? metFORMIN (GLUCOPHAGE-XR) 500 MG 24 hr tablet TAKE 2 TABLETS BY MOUTH EVERY MORNING AND 1 TABLET NIGHTLY GENERIC EQUIVALENT FOR GLUCOPHAGE- XR 270 tablet 0   ??? methotrexate, PF, (OTREXUP, PF,) 20 mg/0.4 mL AtIn Inject the contents of 1 pen (20 mg) under the skin every seven (7) days. 4.8 mL 3   ??? nitroglycerin (NITROSTAT) 0.4 MG SL tablet PLACE 1 TABLET UNDER THE TONGUE EVERY 5 MINUTES AS NEEDED FOR CHEST PAIN UP TO 3 DOSES THEN CALL 911 IF NO RELIEF     ??? pen needle, diabetic 31 gauge x 5/16 (8 mm) Ndle Use 1-3 times daily as directed. 100 each 3   ??? predniSONE (DELTASONE) 1 MG tablet Take 1-4 pills daily as directed for taper (Patient taking differently: Take 6 mg by mouth daily. Take 1-4 pills daily as directed for taper) 360 tablet 3   ??? TOUJEO SOLOSTAR U-300 INSULIN 300 unit/mL (1.5 mL) injection pen INJECT 25 UNITS UNDER THE SKIN NIGHTLY (Patient taking differently: 28 Units nightly. ) 4.5 mL 0   ??? venlafaxine (EFFEXOR-XR) 75 MG 24 hr capsule Take 1 capsule (75 mg total) by mouth daily. 90 capsule 3     No current facility-administered medications on file prior to visit.    ?  ??  Immunization History   Administered Date(s) Administered   ??? Influenza Vaccine Quad (IIV4 PF) 41mo+ injectable 04/26/2013, 04/25/2014, 04/22/2015, 07/25/2018, 04/25/2019     ????  PHYSICAL EXAM?? - limited by video  Vital signs: There were no vitals taken for this visit.There is no height or weight on file to calculate BMI.  Gen: Pleasant and cooperative. AOx4.?  HEENT: PERRLA, EOMI, facial symmetry   Neuro: Good comprehension/cognition. CN 2-12 intact.    Comprehensive Musculoskeletal Examination:???? ?? Jaw, neck without limited ROM.???? Reports some pain with posterior extension of the neck.  ?? Shoulders, elbows, wrists, hands, fingers: Reports patient with ROM of shoulder. Could not visualize well.    ????GENERAL SUMMARY AND IMPRESSION: ????  ????  In summary, the patient is a 64 y.o. female who initially presented with PMR like symptoms but eventually diagnosed with RA (+CCP, -RF) withexternal rheumatologist transferring care to [facility] due to insurance issues. Work-up here was seronegative so leaned towards PMR vs seronegative RA. Patient still with  significant joint and pain stiffness. Difficult to assess over the video. Will arrange for patient to be seen in person at next available so I can fully assess and determine if ongoing pain and stiffness related to PMR vs RA vs OA. Depending on in person visit will then plan for labs and studies. She is due to methotrexate monitoring but for ease will plan to get at her visit with me. Scheduled for in person evaluation in two months but will continue to see if I can see her sooner if there is availability.  ????   Diagnosis ICD-10-CM Associated Orders   1. PMR (polymyalgia rheumatica) (CMS-HCC)  M35.3    2. Inflammatory arthritis  M19.90    3. High risk medication use  Z79.899      I spent 17 minutes on the real-time audio and video visit with the patient on the date of service. I spent an additional 15 minutes on pre- and post-visit activities on the date of service.     The patient was not located and I was not located within 250 yards of a hospital based location during the real-time audio and video visit. The patient was physically located in [place] or a state in which I am permitted to provide care. The patient and/or parent/guardian understood that s/he may incur co-pays and cost sharing, and agreed to the telemedicine visit. The visit was reasonable and appropriate under the circumstances given the patient's presentation at the time.    The patient and/or parent/guardian has been advised of the potential risks and limitations of this mode of treatment (including, but not limited to, the absence of in-person examination) and has agreed to be treated using telemedicine. The patient's/patient's family's questions regarding telemedicine have been answered.    If the visit was completed in an ambulatory setting, the patient and/or parent/guardian has also been advised to contact their provider???s office for worsening conditions, and seek emergency medical treatment and/or call 911 if the patient deems either necessary.    Geanine Vandekamp C. Scarlette Calico, MD, PhD  Assistant Professor of Medicine  Department of Medicine/Division of Rheumatology  [facility]  2:39 PM

## 2019-09-09 NOTE — Unmapped (Signed)
LOV 08/01/19--PE     Pt called to confirm her metformin and invokana were scripts authorized for refill. Confirmed provider refilled both and electronically sent them appropriately to her [facility] mail order.

## 2019-09-25 DIAGNOSIS — M0609 Rheumatoid arthritis without rheumatoid factor, multiple sites: Principal | ICD-10-CM

## 2019-09-25 MED ORDER — OTREXUP (PF) 20 MG/0.4 ML SUBCUTANEOUS AUTO-INJECTOR
SUBCUTANEOUS | 3 refills | 84 days | Status: CP
Start: 2019-09-25 — End: ?
  Filled 2019-09-27: qty 4.8, 84d supply, fill #0

## 2019-09-25 NOTE — Unmapped (Signed)
[facility] Refill Coordination Note    Specialty Medication(s) to be Shipped:   Inflammatory Disorders: Otrexup and General Specialty: Repatha    Other medication(s) to be shipped: n/a     Madison Harris, DOB: 09-07-1955  Phone: [phone] (home) [phone] (work)      All above HIPAA information was verified with patient.     Was a Nurse, learning disability used for this call? No    Completed refill call assessment today to schedule patient's medication shipment from the [facility] [phone]).       Specialty medication(s) and dose(s) confirmed: Regimen is correct and unchanged.   Changes to medications: Madison Harris reports no changes at this time.  Changes to insurance: No  Questions for the pharmacist: No    Confirmed patient received Welcome Packet with first shipment. The patient will receive a drug information handout for each medication shipped and additional FDA Medication Guides as required.       DISEASE/MEDICATION-SPECIFIC INFORMATION        For patients on injectable medications: Patient currently has 0 doses left.  Next injection is scheduled for 4/12.    SPECIALTY MEDICATION ADHERENCE     Medication Adherence    Patient reported X missed doses in the last month: 0  Specialty Medication: Otrexup  Patient is on additional specialty medications: Yes  Additional Specialty Medications: Repatha  Patient Reported Additional Medication X Missed Doses in the Last Month: 0  Patient is on more than two specialty medications: No  Any gaps in refill history greater than 2 weeks in the last 3 months: no  Demonstrates understanding of importance of adherence: yes  Informant: patient                Otrexup 20mg /0.58ml: Patient has 0 days of medication on hand    Repatha 140mg /ml: Patient has 0 days of medication on hand       SHIPPING     Shipping address confirmed in Epic.     Delivery Scheduled: Yes, Expected medication delivery date: 4/9.  However, Rx request for refills was sent to the provider as there are none remaining.     Medication will be delivered via UPS to the prescription address in Epic WAM.    Madison Harris   [facility] Specialty Technician

## 2019-09-25 NOTE — Unmapped (Signed)
Methotrexate refill  Last ov: 09/05/2019    Next ov: 11/06/2019     Labs:   AST   Date Value Ref Range Status   07/26/2019 18 14 - 38 U/L Final   07/25/2018 13.0 7.0 - 31.0 IU/L Final     ALT   Date Value Ref Range Status   07/26/2019 17 <35 U/L Final   07/25/2018 27.0 12.0 - 78.0 IU/L Final     Creatinine   Date Value Ref Range Status   07/26/2019 0.73 0.60 - 1.00 mg/dL Final   16/03/9603 1.2 0.5 - 1.5 mg/dL Final     WBC   Date Value Ref Range Status   01/23/2019 5.8 4.5 - 11.0 10*9/L Final     HGB   Date Value Ref Range Status   01/23/2019 14.7 12.0 - 16.0 g/dL Final     HCT   Date Value Ref Range Status   01/23/2019 46.7 (H) 36.0 - 46.0 % Final     MCV   Date Value Ref Range Status   01/23/2019 105.3 (H) 80.0 - 100.0 fL Final     RDW   Date Value Ref Range Status   01/23/2019 14.4 12.0 - 15.0 % Final     Platelet   Date Value Ref Range Status   01/23/2019 277 150 - 440 10*9/L Final     Neutrophils %   Date Value Ref Range Status   01/23/2019 73.4 % Final     Lymphocytes %   Date Value Ref Range Status   01/23/2019 20.8 % Final     Monocytes %   Date Value Ref Range Status   01/23/2019 3.3 % Final     Eosinophils %   Date Value Ref Range Status   01/23/2019 0.7 % Final     Basophils %   Date Value Ref Range Status   01/23/2019 0.4 % Final

## 2019-09-26 MED FILL — REPATHA SURECLICK 140 MG/ML SUBCUTANEOUS PEN INJECTOR: 84 days supply | Qty: 6 | Fill #1 | Status: AC

## 2019-09-26 MED FILL — REPATHA SURECLICK 140 MG/ML SUBCUTANEOUS PEN INJECTOR: SUBCUTANEOUS | 84 days supply | Qty: 6 | Fill #1

## 2019-09-26 NOTE — Unmapped (Signed)
Oxtrexup okay per pt to be delivered 4/12/21via UPS

## 2019-09-26 NOTE — Unmapped (Signed)
Madison Harris 's Otrexup shipment will be delayed due to Insufficient inventory (RX received after order was placed) We have contacted the patient and left a message We will wait for a call back from the patient/caregiver to reschedule the delivery.  We have NOT confirmed the delivery date.

## 2019-09-27 MED FILL — OTREXUP (PF) 20 MG/0.4 ML SUBCUTANEOUS AUTO-INJECTOR: 84 days supply | Qty: 5 | Fill #0 | Status: AC

## 2019-10-03 ENCOUNTER — Encounter
Admit: 2019-10-03 | Discharge: 2019-10-04 | Payer: PRIVATE HEALTH INSURANCE | Attending: Internal Medicine | Primary: Internal Medicine

## 2019-10-03 DIAGNOSIS — E538 Deficiency of other specified B group vitamins: Principal | ICD-10-CM

## 2019-10-03 DIAGNOSIS — R079 Chest pain, unspecified: Principal | ICD-10-CM

## 2019-10-03 DIAGNOSIS — R197 Diarrhea, unspecified: Principal | ICD-10-CM

## 2019-10-03 DIAGNOSIS — D7589 Other specified diseases of blood and blood-forming organs: Principal | ICD-10-CM

## 2019-10-03 DIAGNOSIS — F419 Anxiety disorder, unspecified: Principal | ICD-10-CM

## 2019-10-03 LAB — CBC
HEMATOCRIT: 41.9 % (ref 35.0–45.0)
HEMOGLOBIN: 14 g/dL (ref 11.3–15.5)
MEAN CORPUSCULAR HEMOGLOBIN CONC: 33.4 g/dL (ref 31.0–36.0)
MEAN CORPUSCULAR HEMOGLOBIN: 33.7 pg — ABNORMAL HIGH (ref 26.0–32.0)
MEAN PLATELET VOLUME: 8.4 fL (ref 6.5–8.9)
PLATELET COUNT: 318 10*9/L (ref 140–440)
RED BLOOD CELL COUNT: 4.15 10*12/L (ref 3.80–5.10)
WBC ADJUSTED: 7.1 10*9/L (ref 3.8–10.8)

## 2019-10-03 LAB — VITAMIN B-12: Cobalamins:MCnc:Pt:Ser/Plas:Qn:: 234

## 2019-10-03 LAB — RED CELL DISTRIBUTION WIDTH: Lab: 14.3

## 2019-10-03 LAB — THYROID STIMULATING HORMONE: Thyrotropin:ACnc:Pt:Ser/Plas:Qn:: 0.43

## 2019-10-03 MED ORDER — CYANOCOBALAMIN (VIT B-12) 1,000 MCG/ML INJECTION SOLUTION
2 refills | 0 days | Status: CP
Start: 2019-10-03 — End: ?

## 2019-10-03 MED ORDER — ESCITALOPRAM 20 MG TABLET
ORAL_TABLET | ORAL | 2 refills | 35 days | Status: CP
Start: 2019-10-03 — End: 2020-10-12

## 2019-10-03 MED ORDER — LORAZEPAM 0.5 MG TABLET
ORAL_TABLET | Freq: Two times a day (BID) | ORAL | 0 refills | 10.00000 days | Status: CP | PRN
Start: 2019-10-03 — End: 2020-10-02

## 2019-10-03 NOTE — Unmapped (Addendum)
-   Please let your cardiologist know about your recent chest pain   - Rechecking blood work today for blood count, B12, and thyroid. Will release on MyChart   - Stop taking venlafaxine. Put venlafaxine that you currently have (75mg ) in cabinet and sticky note to not use   - For the lexapro, you will have 20mg  tables, break in half and take half for the next 10 days. After 10 days, take full pill of lexapro   - In addition, going to send a short-term prescription that will be used to acutely lessen your anxiety (lorazepam). Will give 20 pills. Please note, the longer you use it, the more you need it. So only use for very acute situations and please use with caution. Will make you sleepy -- no driving, etc.

## 2019-10-03 NOTE — Unmapped (Signed)
[facility]  [address] [place], [place] [address]  Ph: [phone], F: [phone]            ASSESSMENT & PLAN   Madison Harris. Ergle is 64 y.o. patient with a PMH of CAD s/p PCI, hypertension, hyperlipidemia, T2DM, and suspected B12 deficiency in some distress 2/2 anxiousness seen today for concerns of anxiety.    Diagnoses and all orders for this visit:    Chest pain, unspecified type  Asked patient to contact her cardiologist, Dr. Andrey Farmer, to follow-up on chest pain and anginal symptoms given setting of Hx of ischemia heart disease. Last week had an episode where she took nitroglycerin. Patient declined to do so. Encouraged patient again in patient instructions.       Anxiety  Patient presents with worsening anxiety for the past 2-3 weeks following several life events. Has somatic symptoms that include chest pain, diarrhea, and increased headaches. Patient has anxiety in social situations and at baseline but this is worse and new.   Number one of my differential is a generalized anxiety disorder given the patient's endorsement of anxiety and characteristic of stress with somatic symptoms. There is potentially an adjustment disorder aspect comingling with this anxiety. Lower on my different are hyperthyroidism, given the lack of tachycardia and hypertension. Similarly, the patient is likely B12 deficient, but I do no think this is the sole reason for her recent increased anxiety.    -     TSH; Future  - Prescribed 20mg  of Lexapro and have asked patient to taper with 10mg  over 10 days and then increase to the 20mg  dose. Have provided lorazepam as a short term bridge for any acute anxiety episodes.  Have told patient to discontinue the venlafaxine.       Previous lab work suggested B12 deficiency and macrocytosis. To confirm B12 deficiency and rule out secondary causes of anxiety and memory decline we have ordered a CBC, B12, and TSH.   Diarrhea, unspecified type  -     TSH; Future    B12 deficiency  -     Vitamin B12; Future  -     CBC; Future    Macrocytosis  -     CBC; Future      Teaching Attestation: I attest that I have reviewed note and that the components of the HPI, ROS, physical exam, and the A&P documented were performed by me or were performed in my presence by the student and verified by me. -Berta Minor, MD   10/03/2019        HPI     Chief Complaint   Patient presents with   ??? Anxiety     want medication to help deal      Madison Harris is a 64 y.o. female in today for feelings of anxiousness. Patient reports that she has always had anxiety (some new, some old) but there has been bad anxiety for the past 2-3 weeks. 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 Anxiety is worsening/progresive. Patient reports memory issues at work. Patients reports that she feels a little depressed but denied plans to harm herself or others, guilt, or lack of motivation to get out of bed in the morning. Patient endorses increased frequency of headaches without any precipitating/inciting events. Patient reports diarrhea right after most meals that has been going on for 2-3 weeks. In addition, patient has had chest pain that is in the center of chest (  substernal chest pain) and radiating to the upper left back. Patient endorses tingling in L arm. Patient took nitroglycerin one time last week when her chest was hurting. Denies chest pain today.       Review of Systems    I have reviewed and updated the past medical, social, and family history today and updated them in the EMR.    Outpatient Medications Prior to Visit   Medication Sig Dispense Refill   ??? aspirin (ECOTRIN) 81 MG tablet Take 81 mg by mouth.     ??? blood sugar diagnostic Strp by Other route daily. 200 each 2   ??? canagliflozin (INVOKANA) 300 mg Tab tablet Take 1 tablet (300 mg total) by mouth daily with evening meal. 90 tablet 3   ??? carvediloL (COREG) 12.5 MG tablet Take 1 tablet (12.5 mg total) by mouth 2 (two) times a day with meals. 180 tablet 3   ??? ergocalciferol, vitamin D2, (VITAMIN D2 ORAL) Take by mouth.     ??? evolocumab 140 mg/mL PnIj Inject the contents of 1 pen (140 mg) under the skin every fourteen (14) days. 6 mL 3   ??? folic acid (FOLVITE) 1 MG tablet Take 1 mg by mouth.     ??? isosorbide mononitrate (IMDUR) 30 MG 24 hr tablet Take 1 tablet (30 mg total) by mouth daily. 90 tablet 3   ??? lancets (FREESTYLE) 28 gauge Misc USE AS INSTRUCTED 200 each 2   ??? lisinopriL (PRINIVIL,ZESTRIL) 40 MG tablet Take 1 tablet (40 mg total) by mouth daily. 90 tablet 3   ??? metFORMIN (GLUCOPHAGE-XR) 500 MG 24 hr tablet TAKE 2 TABLETS BY MOUTH EVERY MORNING AND 1 TABLET NIGHTLY GENERIC EQUIVALENT FOR GLUCOPHAGE- XR 270 tablet 0   ??? methotrexate, PF, (OTREXUP, PF,) 20 mg/0.4 mL AtIn Inject the contents of 1 pen (20 mg) under the skin every seven (7) days. 4.8 mL 3   ??? nitroglycerin (NITROSTAT) 0.4 MG SL tablet PLACE 1 TABLET UNDER THE TONGUE EVERY 5 MINUTES AS NEEDED FOR CHEST PAIN UP TO 3 DOSES THEN CALL 911 IF NO RELIEF     ??? pen needle, diabetic 31 gauge x 5/16 (8 mm) Ndle Use 1-3 times daily as directed. 100 each 3   ??? predniSONE (DELTASONE) 1 MG tablet Take 1-4 pills daily as directed for taper (Patient taking differently: Take 6 mg by mouth daily. Take 1-4 pills daily as directed for taper) 360 tablet 3   ??? TOUJEO SOLOSTAR U-300 INSULIN 300 unit/mL (1.5 mL) injection pen INJECT 25 UNITS UNDER THE SKIN NIGHTLY (Patient taking differently: 28 Units nightly. ) 4.5 mL 0   ??? venlafaxine (EFFEXOR-XR) 75 MG 24 hr capsule Take 1 capsule (75 mg total) by mouth daily. 90 capsule 3   ??? blood-glucose meter kit Check blood sugar daily. 1 each 0     No facility-administered medications prior to visit.     Allergies   Allergen Reactions   ??? Pravastatin Muscle Pain     Other Reaction: myalgia   ??? Semaglutide Diarrhea       EXAM & DATA   T 36.7 ??C (98 ??F) - P 56 - RR   - SpO2 97%  BP   BP Readings from Last 3 Encounters: 10/03/19 124/80   08/01/19 132/68   07/04/19 140/70     Wt   Wt Readings from Last 3 Encounters:   07/04/19 75.8 kg (167 lb)   01/23/19 74.2 kg (163 lb 8 oz)   01/15/19 74.8  kg (165 lb)    -  Physical Exam  Gen: Well-appearing, NAD,   CV: RRR, normal S1/S2, no m/r/g  Pulm: CTAB, no rales/ronchi/wheezes  MSK: normal posture sitting    Data Reviewed:    ----------------------------------------------------------------------------------------------------------------------  This note was dictated with Dragon Medical and may contain typos missed during proofreading.

## 2019-10-04 MED ORDER — SYRINGE 3 ML 25 GAUGE X 1"
2 refills | 0 days | Status: CP
Start: 2019-10-04 — End: ?
  Filled 2019-10-07: qty 6, 84d supply, fill #0

## 2019-10-04 MED ORDER — SAFETY NEEDLES 25 X 5/8"
3 refills | 0 days | Status: CP
Start: 2019-10-04 — End: ?

## 2019-10-07 MED FILL — CYANOCOBALAMIN (VIT B-12) 1,000 MCG/ML INJECTION SOLUTION: 84 days supply | Qty: 6 | Fill #0 | Status: AC

## 2019-10-07 MED FILL — BD LUER-LOK SYRINGE 3 ML 25 GAUGE X 1": 84 days supply | Qty: 6 | Fill #0 | Status: AC

## 2019-10-07 MED FILL — CYANOCOBALAMIN (VIT B-12) 1,000 MCG/ML INJECTION SOLUTION: 84 days supply | Qty: 6 | Fill #0

## 2019-10-22 NOTE — Unmapped (Signed)
Patient has enough medication on hand, rescheduling refill call for 6/22

## 2019-11-06 ENCOUNTER — Ambulatory Visit: Admit: 2019-11-06 | Discharge: 2019-11-06 | Payer: PRIVATE HEALTH INSURANCE

## 2019-11-06 ENCOUNTER — Encounter: Admit: 2019-11-06 | Discharge: 2019-11-06 | Payer: PRIVATE HEALTH INSURANCE

## 2019-11-06 DIAGNOSIS — M353 Polymyalgia rheumatica: Principal | ICD-10-CM

## 2019-11-06 DIAGNOSIS — Z79899 Other long term (current) drug therapy: Principal | ICD-10-CM

## 2019-11-06 DIAGNOSIS — M0609 Rheumatoid arthritis without rheumatoid factor, multiple sites: Principal | ICD-10-CM

## 2019-11-06 LAB — CBC W/ AUTO DIFF
BASOPHILS ABSOLUTE COUNT: 0 10*9/L (ref 0.0–0.1)
BASOPHILS RELATIVE PERCENT: 0.8 %
EOSINOPHILS ABSOLUTE COUNT: 0.1 10*9/L (ref 0.0–0.7)
EOSINOPHILS RELATIVE PERCENT: 1.7 %
HEMATOCRIT: 40.9 % (ref 35.0–44.0)
HEMOGLOBIN: 13.7 g/dL (ref 12.0–15.5)
LYMPHOCYTES ABSOLUTE COUNT: 1.6 10*9/L (ref 0.7–4.0)
LYMPHOCYTES RELATIVE PERCENT: 29.5 %
MEAN CORPUSCULAR HEMOGLOBIN: 33.7 pg (ref 26.0–34.0)
MEAN CORPUSCULAR VOLUME: 100.7 fL — ABNORMAL HIGH (ref 82.0–98.0)
MEAN PLATELET VOLUME: 8.5 fL (ref 7.0–10.0)
MONOCYTES ABSOLUTE COUNT: 0.4 10*9/L (ref 0.1–1.0)
MONOCYTES RELATIVE PERCENT: 6.7 %
NEUTROPHILS ABSOLUTE COUNT: 3.2 10*9/L (ref 1.7–7.7)
NEUTROPHILS RELATIVE PERCENT: 61.3 %
NUCLEATED RED BLOOD CELLS: 0 /100{WBCs} (ref <=4)
RED BLOOD CELL COUNT: 4.06 10*12/L (ref 3.90–5.03)
WBC ADJUSTED: 5.3 10*9/L (ref 3.5–10.5)

## 2019-11-06 LAB — C-REACTIVE PROTEIN
C reactive protein:MCnc:Pt:Ser/Plas:Qn:: 6
C-REACTIVE PROTEIN: 6 mg/L (ref <=10.0)

## 2019-11-06 LAB — ALT (SGPT): Alanine aminotransferase:CCnc:Pt:Ser/Plas:Qn:: 14

## 2019-11-06 LAB — ERYTHROCYTE SEDIMENTATION RATE: Lab: 8

## 2019-11-06 LAB — EGFR CKD-EPI NON-AA FEMALE
Glomerular filtration rate/1.73 sq M.predicted.non black:ArVRat:Pt:Ser/Plas/Bld:Qn:Creatinine-based formula (CKD-EPI): >90

## 2019-11-06 LAB — MEAN CORPUSCULAR VOLUME: Erythrocyte mean corpuscular volume:EntVol:Pt:RBC:Qn:Automated count: 100.7 — ABNORMAL HIGH

## 2019-11-06 LAB — AST (SGOT): Aspartate aminotransferase:CCnc:Pt:Ser/Plas:Qn:: 11

## 2019-11-06 MED ORDER — PREDNISONE 5 MG TABLET
ORAL_TABLET | Freq: Every day | ORAL | 6 refills | 30.00000 days | Status: CP
Start: 2019-11-06 — End: ?
  Filled 2019-11-08: qty 45, 30d supply, fill #0

## 2019-11-06 NOTE — Unmapped (Signed)
Get labs and x-ray today.   Will increase prednisone to 7.5 mg (One and half of a 5 mg tablet)  Continue Otrexup (methotrexate) 20 mg once a week and folic acid 1 mg daily

## 2019-11-06 NOTE — Unmapped (Signed)
Patient Name: Sherald Hess  PCP: ??Doroteo Bradford, MD  Source of History: patient and records  Date of Visit: 11/06/19 4:28 PM    REASON FOR VISIT:  Follow-up for RA (+CCP) vs PMR    PRIOR RHEUMATOLOGIC HISTORY:?? per records, initially thought to be PMR but could not taper off prednisone. RF neg but CCP 40's. RA management with methotrexate. [facility].    Patient initially presented to NP Wallace Cullens at [facility] a year ago. Symptoms started 3-4 months ago before she saw NP Wallace Cullens. She had trouble walking and moving her neck as well as hips. She reports the hips were so stiff so she could not walk and had gelling phenomena. She had initially complained of symptoms to her endocrinologist for DM who felt her blood sugars were not well controlled.  Due to insurance issues, she needs to switch providers to [facility]. She has been on methotrexate 15 mg [place] qweek since November 2019. At last visit in 08/2019 she had tapered to 3 mg prednisone (1 mg every month) but had worsening of symptoms (hip mostly) so was bumped back to 4 mg.    HPI: Kariana Wiles is a 64 y.o. female who I initially met in April 2020 virtually to establish rheumatologic care for probable RA vs PMR. Mtx increased to 20 mg at that visit. Repeat RF and CCP were negative.  She completed a televisit in July 2020 with [facility] for follow-up to continue prednisone taper to 3 mg daily.  Patient presents today for follow-up by video visit.    Since increasing prednisone to 4 mg things are not better.  If anything hip discomfort is a little worse predominantly on the right.  She has significant stiffness making ambulation difficult after she is sitting for a period of time.  Stiffness improves in about 5 minutes.  Additionally, she has developed shoulder tightness/stiffness.  Not overtly painful.  MCPs, PIPs and wrists are mildly painful as well.  None of these joints become red swollen or erythematous.  She endorses chronic tension type headaches which have not changed in character.  No visual changes or diplopia.  No jaw claudication or easy jaw fatigue per se but does have bilateral jaw pain which comes and goes with no apparent pattern for the last month.  No temple tenderness.  No fevers, chills, sweats.  No rash.  No lymphadenopathy.  Weight is stable.    She is not sure what dose of prednisone she felt normal on.  She continues to feel that methotrexate is helpful.    She and her husband plan for an extended vacation traveling across the [place] pending getting in mid June and will be gone for several months.    ROS??: Attests to the above, otherwise, review of all other systems is negative.  ????  Past Medical and Surgical History:  ??  Patient Active Problem List    Diagnosis Date Noted   ??? B12 deficiency 04/25/2019   ??? PMR (polymyalgia rheumatica) (CMS-HCC) 01/23/2019   ??? Current chronic use of systemic steroids 01/23/2019   ??? OSA (obstructive sleep apnea) 01/23/2019   ??? T2DM, uncontrolled, on insulin 07/25/2018   ??? CAD s/p PCI 07/25/2018   ??? Essential hypertension 07/25/2018   ??? Mixed hyperlipidemia 07/25/2018   ??? Personal history of noncompliance with medical treatment, presenting hazards to health 04/04/2016     Past Surgical History:   Procedure Laterality Date   ??? CARDIAC SURGERY  2 stents.    ??? HYSTERECTOMY       Allergies:   ??  Allergies   Allergen Reactions   ??? Pravastatin Muscle Pain     Other Reaction: myalgia   ??? Semaglutide Diarrhea     Current Outpatient Medications:  ??  Current Outpatient Medications on File Prior to Visit   Medication Sig Dispense Refill   ??? aspirin (ECOTRIN) 81 MG tablet Take 81 mg by mouth.     ??? blood sugar diagnostic Strp by Other route daily. 200 each 2   ??? canagliflozin (INVOKANA) 300 mg Tab tablet Take 1 tablet (300 mg total) by mouth daily with evening meal. 90 tablet 3   ??? cyanocobalamin, vitamin B-12, 1,000 mcg/mL injection Inject 1mL once weekly for 4 weeks, THEN inject 1mL once MONTHLY thereafter. 30 mL 2   ??? ergocalciferol, vitamin D2, (VITAMIN D2 ORAL) Take by mouth.     ??? escitalopram oxalate (LEXAPRO) 20 MG tablet Take 0.5 tablets (10 mg total) by mouth daily for 10 days, THEN 1 tablet (20 mg total) daily. 30 tablet 2   ??? evolocumab 140 mg/mL PnIj Inject the contents of 1 pen (140 mg) under the skin every fourteen (14) days. 6 mL 3   ??? folic acid (FOLVITE) 1 MG tablet Take 1 mg by mouth.     ??? isosorbide mononitrate (IMDUR) 30 MG 24 hr tablet Take 1 tablet (30 mg total) by mouth daily. 90 tablet 3   ??? lancets (FREESTYLE) 28 gauge Misc USE AS INSTRUCTED 200 each 2   ??? lisinopriL (PRINIVIL,ZESTRIL) 40 MG tablet Take 1 tablet (40 mg total) by mouth daily. 90 tablet 3   ??? LORazepam (ATIVAN) 0.5 MG tablet Take 1 tablet (0.5 mg total) by mouth two (2) times a day as needed for anxiety (or panic attack). 20 tablet 0   ??? metFORMIN (GLUCOPHAGE-XR) 500 MG 24 hr tablet TAKE 2 TABLETS BY MOUTH EVERY MORNING AND 1 TABLET NIGHTLY GENERIC EQUIVALENT FOR GLUCOPHAGE- XR 270 tablet 0   ??? methotrexate, PF, (OTREXUP, PF,) 20 mg/0.4 mL AtIn Inject the contents of 1 pen (20 mg) under the skin every seven (7) days. 4.8 mL 3   ??? nitroglycerin (NITROSTAT) 0.4 MG SL tablet PLACE 1 TABLET UNDER THE TONGUE EVERY 5 MINUTES AS NEEDED FOR CHEST PAIN UP TO 3 DOSES THEN CALL 911 IF NO RELIEF     ??? pen needle, diabetic 31 gauge x 5/16 (8 mm) Ndle Use 1-3 times daily as directed. 100 each 3   ??? predniSONE (DELTASONE) 1 MG tablet Take 1-4 pills daily as directed for taper (Patient taking differently: Take 6 mg by mouth daily. Take 1-4 pills daily as directed for taper) 360 tablet 3   ??? safety needles 25 x 5/8  Ndle Use once a week for 4 weeks for b12 injections then once a month after 4 weeks 100 each 3   ??? syringe with needle (BD LUER-LOK SYRINGE) 3 mL 25 gauge x 1 Syrg Use once a week for 4 weeks for b12 injections then once a month after 4 weeks 100 each 2   ??? TOUJEO SOLOSTAR U-300 INSULIN 300 unit/mL (1.5 mL) injection pen INJECT 25 UNITS UNDER THE SKIN NIGHTLY (Patient taking differently: 28 Units nightly. ) 4.5 mL 0   ??? blood-glucose meter kit Check blood sugar daily. 1 each 0   ??? carvediloL (COREG) 12.5 MG tablet Take 1 tablet (12.5 mg total) by mouth 2 (two) times a day with meals.  180 tablet 3     No current facility-administered medications on file prior to visit.   ?  ??  Immunization History   Administered Date(s) Administered   ??? Influenza Vaccine Quad (IIV4 PF) 64mo+ injectable 04/26/2013, 04/25/2014, 04/22/2015, 07/25/2018, 04/25/2019     ??  PHYSICAL EXAM??  Vital signs: BP 124/61 (BP Site: L Arm, BP Position: Sitting, BP Cuff Size: Small)  - Pulse 70  - Temp 36.2 ??C (Temporal)  - Ht 154.9 cm (5' 1)  - Wt 77 kg (169 lb 12.8 oz)  - BMI 32.08 kg/m?? Body mass index is 32.08 kg/m??.  Gen: Elderly female, NAD, well-appearing  Eyes: Non-icteric, PERRL, EOMI  ENT: MMM, adequate saliva, neck supple, no masses, OP clear  CV: RRR, no m/r/g  Pulm: CTAB, no wheezes, rhonchi or stridor  Ext: Warm, well perfused, no edema  MSK: Full passive ROM in all joints, mildly reduced active ROM of the neck and shoulder girdle, no joint swelling/erythema or tenderness to palpation throughout.  There is full ROM at the hip.  There is mild right inguinal hip pain with flexion and external rotation.  No pain in the left hip with manipulation.  Some crepitus of the bilateral knees with ROM.  Neuro: Alert, oriented, fluent speech, high-frequency resting tremor of the right upper extremity, no increased tone in the bilateral upper extremities or lower extremity clonus  ?? Psych: appropriate mood and affect    ????GENERAL SUMMARY AND IMPRESSION: ????  ????  In summary, the patient is a 64 y.o. female who initially presented with PMR like symptoms but eventually diagnosed with RA (+CCP, -RF) with external rheumatologist transferring care to [facility] due to insurance issues. Work-up here was seronegative.  At this point, symptoms suggest PMR rather than seronegative RA given predominant stiffness rather and physical exam w/o evidence of overt synovitis. Unfortunately, symptoms have recurred and worsened toward end of steroid taper with no improvement after small increase in dose.  Since she is uncertain what dose of prednisone she felt well, will empirically increase up to 7.5 mg daily and coordinate close follow-up (although her extended vacation plans make timing less than ideal).  Given prolonged steroid use will evaluate BMD with vitamin D and DEXA scan.  Repeat basic labs for methotrexate monitoring.  Additionally, given her right inguinal hip pain will evaluate for osteoarthritis or AVN given prolonged steroids with hip x-rays.  ????   Diagnosis ICD-10-CM Associated Orders   1. PMR (polymyalgia rheumatica) (CMS-HCC)  M35.3 Vitamin D 25 Hydroxy (25OH D2 + D3)     ALT     AST     CBC w/ Differential     Creatinine     CRP  C-Reactive Protein     ESR Sed rate     Dexa Bone Density Skeletal     predniSONE (DELTASONE) 5 MG tablet     XR Hips 2 Views Bilateral With Pelvis 5 or more views   2. Rheumatoid arthritis of multiple sites with negative rheumatoid factor (CMS-HCC)  M06.09 ALT     AST     CBC w/ Differential     Creatinine     CRP  C-Reactive Protein     ESR Sed rate     predniSONE (DELTASONE) 5 MG tablet   3. High risk medication use  Z79.899 Vitamin D 25 Hydroxy (25OH D2 + D3)     ALT     AST     CBC w/ Differential  Creatinine     Dexa Bone Density Skeletal     If the visit was completed in an ambulatory setting, the patient and/or parent/guardian has also been advised to contact their provider???s office for worsening conditions, and seek emergency medical treatment and/or call 911 if the patient deems either necessary.    Kylo Gavin L. Hilda Blades, MD  [facility] Internal Medicine PGY3    This patient was seen and evaluated with Dr. Scarlette Calico

## 2019-11-07 DIAGNOSIS — M353 Polymyalgia rheumatica: Principal | ICD-10-CM

## 2019-11-07 MED ORDER — CONTOUR NEXT TEST STRIPS
ORAL_STRIP | 3 refills | 0 days | Status: CP
Start: 2019-11-07 — End: ?

## 2019-11-07 MED ORDER — PREDNISONE 1 MG TABLET
ORAL_TABLET | 0 refills | 0 days
Start: 2019-11-07 — End: ?

## 2019-11-08 MED ORDER — PREDNISONE 1 MG TABLET
ORAL_TABLET | 0 refills | 0 days
Start: 2019-11-08 — End: ?

## 2019-11-08 MED FILL — PREDNISONE 5 MG TABLET: 30 days supply | Qty: 45 | Fill #0 | Status: AC

## 2019-11-11 DIAGNOSIS — M533 Sacrococcygeal disorders, not elsewhere classified: Principal | ICD-10-CM

## 2019-11-11 LAB — VITAMIN D, TOTAL (25OH): Lab: 25

## 2019-11-11 LAB — VITAMIN D 25 HYDROXY: VITAMIN D, TOTAL (25OH): 25 ng/mL (ref 20.0–80.0)

## 2019-11-11 NOTE — Unmapped (Signed)
Called patient.  Discussed x-ray result with sacral density. She does not have a uterus.   [facility] radiologist recommend MRI with and without contrast to further assess the density.  Patient agreed to MRI.  Patient reported increasing neck and shoulder pain. Transient headache. Pain with chewing.   Suggested increasing prednisone further from 7.5 to 20. She wanted to wait a few more days.  She appreciated phone call.

## 2019-11-14 ENCOUNTER — Ambulatory Visit
Admit: 2019-11-14 | Discharge: 2019-11-15 | Payer: PRIVATE HEALTH INSURANCE | Attending: Internal Medicine | Primary: Internal Medicine

## 2019-11-14 DIAGNOSIS — I10 Essential (primary) hypertension: Principal | ICD-10-CM

## 2019-11-14 DIAGNOSIS — Z7952 Long term (current) use of systemic steroids: Principal | ICD-10-CM

## 2019-11-14 DIAGNOSIS — E1165 Type 2 diabetes mellitus with hyperglycemia: Principal | ICD-10-CM

## 2019-11-14 DIAGNOSIS — F419 Anxiety disorder, unspecified: Principal | ICD-10-CM

## 2019-11-14 DIAGNOSIS — Z794 Long term (current) use of insulin: Secondary | ICD-10-CM

## 2019-11-14 MED ORDER — ESCITALOPRAM 20 MG TABLET
ORAL_TABLET | Freq: Every day | ORAL | 4 refills | 90 days | Status: CP
Start: 2019-11-14 — End: ?

## 2019-11-14 MED ORDER — METFORMIN ER 500 MG TABLET,EXTENDED RELEASE 24 HR
ORAL_TABLET | ORAL | 3 refills | 90 days | Status: CP
Start: 2019-11-14 — End: ?

## 2019-11-14 NOTE — Unmapped (Signed)
[facility]  [address] [place], [place] [address]  Ph: [phone], F: [phone]            ASSESSMENT & PLAN   Madison Harris is 64 y.o. with a PMH of PMR, T2DM, HTN, and anxiety seen today for follow-up of switching from venlafaxine to lexapro.    Diagnoses and all orders for this visit:    Anxiety  Patient presents for follow-up after switching from venlafaxine to lexapro for anxiety approximately 5 weeks ago. Patient reports doing much better and is no longer as on edge. Still endorses some memory fog and low energy but feels much less anxious. Denies any side effects from the medication and is tolerating current dose well. Has not had to use the lorazepam for any panic attacks yet but feels reassured to have it available.   Will continue with lexapro 20 mg every day as prescribed.       Essential hypertension  Current chronic use of systemic steroids  T2DM, uncontrolled, on insulin  Continue with current regimen. Patient knows that she can refill medications at a local pharmacy [place].     Abnormal Sacral X-Ray Finding  Counseled patient to follow-up with MRI per rheumatology's request.     Teaching Attestation: I attest that I have reviewed note and that the components of the HPI, ROS, physical exam, and the A&P documented were performed by me or were performed in my presence by the student and verified by me. -Berta Minor, MD   11/14/2019    HPI     Chief Complaint   Patient presents with   ??? Follow-up     Madison Harris is a 64 y.o. female in today for follow up of anxiety after switching from venlaxafine to lexapro approximately 5 weeks ago. Patient reports feeling much better and not being as on edge. Affect is markedly improved. Patient denies any side effects from the medications, including headaches, sleep disturbances, and stomach disturbances with patterns related to the medication. Still endorses issues with memory and low energy. In addition, endorses frequent diarrhea and issues with having severe urges after eating and overall stomach tenderness that has been chronic in nature.     Patient has recently sold house, which was a prior stressor. Patient is headed on a 3-4 month cross-country road trip/vacation out [place] for horseback riding (traveling in [employer] trailer with husband). I suspect that these activities will help to continue to improve mood.     I have reviewed and updated the past medical, social, and family history today and updated them in the EMR.      EXAM & DATA   T 36.1 ??C (97 ??F) - P 60 - RR   - SpO2 97%  BP   BP Readings from Last 3 Encounters:   11/14/19 104/60   11/06/19 124/61   10/03/19 124/80     Wt   Wt Readings from Last 3 Encounters:   11/06/19 77 kg (169 lb 12.8 oz)   07/04/19 75.8 kg (167 lb)   01/23/19 74.2 kg (163 lb 8 oz)      Physical Exam  Gen: Well-appearing, NAD  CV: RRR, normal S1/S2, no m/r/g  Pulm: CTAB, no rales/ronchi/wheezes  Psych: normal affect     ----------------------------------------------------------------------------------------------------------------------  This note was dictated with Dragon Medical and may contain typos missed during proofreading.

## 2019-11-14 NOTE — Unmapped (Addendum)
-   Can send refills while you are on your trip. Have a nice time!   - Need to do colorectal cancer screening as soon as your return from your trip

## 2019-11-21 ENCOUNTER — Ambulatory Visit: Admit: 2019-11-21 | Discharge: 2019-11-22 | Payer: PRIVATE HEALTH INSURANCE

## 2019-11-21 DIAGNOSIS — M353 Polymyalgia rheumatica: Principal | ICD-10-CM

## 2019-11-21 DIAGNOSIS — R109 Unspecified abdominal pain: Principal | ICD-10-CM

## 2019-11-21 DIAGNOSIS — Z79899 Other long term (current) drug therapy: Principal | ICD-10-CM

## 2019-11-21 DIAGNOSIS — R197 Diarrhea, unspecified: Principal | ICD-10-CM

## 2019-11-21 MED ORDER — PREDNISONE 5 MG TABLET
ORAL_TABLET | Freq: Every day | ORAL | 1 refills | 90.00000 days | Status: CP
Start: 2019-11-21 — End: 2019-11-21

## 2019-11-21 MED ORDER — PREDNISONE 10 MG TABLET
ORAL_TABLET | Freq: Every day | ORAL | 1 refills | 90.00000 days | Status: CP
Start: 2019-11-21 — End: ?

## 2019-11-21 MED ORDER — FOLIC ACID 1 MG TABLET
ORAL_TABLET | Freq: Every day | ORAL | 3 refills | 90.00000 days | Status: CP
Start: 2019-11-21 — End: ?

## 2019-11-21 NOTE — Unmapped (Addendum)
Please follow up with Dr Tye Maryland about the stomach issues. Once these have been determined to not be infection, will plan to start actemra shots once weekly.    Continue otrexup and folic acid.  Increase prednisone to 10 mg daily until we can start actemra.    I recommend taking calcium and vitamin D supplements for bone health. You should take in 1,200 mg of calcium daily total from dietary and over the counter supplement sources. You should take in 2,000 units of vitamin D daily from over the counter supplements.      Tocilizumab (actemra) (Information from the [employer] of Rheumatology)    Tocilizumab (Actemra ??) is a biologic medication currently approved to treat adults with moderately to severely active rheumatoid arthritis (RA), adults with giant cell arteritis (GCA), and people ages two and above with Polyarticular Juvenile Idiopathic Arthritis (PJIA) or Systemic Juvenile Idiopathic Arthritis (SJIA). Biologic medications are proteins designed by humans that affect the immune system. Tocilizumab blocks the inflammatory protein IL-6. This improves joint pain and swelling from arthritis and other symptoms caused by inflammation.    How to Take It    For adults with RA or GCA, tocilizumab can be given as either a monthly intravenous infusion or as a subcutaneous injection every one ??? two weeks. For people with PJIA or SJIA, tocilizumab is given as a monthly intravenous infusion. When used as a subcutaneous injection, the medicine can be injected into the thigh or abdomen. The site of injection should be rotated so the same site is not used multiple times. Some patients will start to see improvement within a few weeks, but it may take several months to take full effect. Tocilizumab may be taken alone or with methotrexate or other non-biologic drugs. Tocilizumab should not be given in combination with another biologic drug. Blood tests will be used to monitor for increases in cholesterol or liver enzymes and for reductions in blood cell counts while taking tocilizumab.    Side Effects    Tocilizumab can lower the ability of your immune system to fight infections. If you develop symptoms of an infection while using this medication, you should stop it and contact your doctor. All patients should be tested for tuberculosis before starting on tocilizumab, although these types of infections have not been frequently seen. Allergic reactions to intravenous tocilizumab infusions can occur, which can include symptoms such as fever and chills, but these are rare. Tocilizumab has been associated with increased cholesterol levels in some patients, and should be periodically monitored. If your cholesterol level becomes too high, it is possible you may need to start taking a medication to lower it. A rare complication seen with tocilizumab use in clinical trials was bowel perforation, or a hole in the bowel wall. If you have a history or diverticulitis or develop abdominal pain or bloody bowel movements while taking tocilizumab, you should notify your doctor immediately.    Tell Your Doctor    You should notify your doctor if you develop symptoms of an infection, such as a fever or cough, or if you think you are having any side effects, especially abdominal pain, bloody bowel movements, or allergic reactions. Notify your doctor if you become pregnant, are planning pregnancy, or if you are breastfeeding. Be sure to talk with your doctor if you are planning to have surgery or if you plan to get any live vaccinations; these include the shingles vaccine, the nasal spray flu vaccine, and others such as the measles, mumps,  rubella, and yellow fever vaccines.    Updated July 2017 by Cherylann Parr, MD and reviewed by the [facility] of Rheumatology Committee on Communications and Marketing.    This information is provided for general education only. Individuals should consult a qualified health care provider for professional medical advice, diagnosis and treatment of a medical or health condition.    ?? 2017 [employer] of Rheumatology

## 2019-11-21 NOTE — Unmapped (Signed)
REASON FOR VISIT: f/u PMR    HISTORY: [name] is a 64 y.o. 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  Initially diagnosed with PMR by outside rheumatologist, severe stiffness in hips and neck. 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 Started SQ mtx (otrexup) in 04/2018. Transferred care to our clinic in 09/2018 due to insurance. Mtx increased to 20 mg at that visit. Repeat RF and CCP were negative. Suspect that underlying diagnosis is PMR.  Treatment history:  - Prednisone taper  - Difficulty tapering prednisone, so mtx (otrexup) added 04/2018  - Transferred care to [facility] rheumatology in 09/2018  - Worsening pain again after tapering to 3 mg, increased back to 7.5 mg in 10/2019  - She then reported increasing pain and transient HA, pain with chewing. Recommended further increase in prednisone, but pt wished to remain on 7.5 mg.   - Current medication regimen: mtx sq (otrexup) 20 mg qwk, FA 1 mg qd, prednisone 7.5 mg qd.     Interim history:   Presents today for f/u.     She notes that she is not doing great. She can tolerate her current symptoms, but she is not doing as well as she would like to be. She notes pain in the wrists b/l radiating up to the forearms. Also pain under the shoulder blades b/l. Also pain in the low back. She cannot stay seated for too long due to feeling stiff. AM stiffness too, not sure for how long. Pain in the anterior hips b/l worse on R. Hip pain worse after sitting a while.   She has been having temporal HAs intermittently for months. Same on both sides. None currently. No vision changes. She endorses jaw pain with eating, but not claudication.   No fevers, chills, wt loss.     She ran out of folic acid at least a month ago and has not refilled just due to not getting around to it.     She notes stomach issues x 2 mo. She notes tenderness at the top of the stomach during the day. Also explosive watery yellow stools after eating. This is somewhat distressing for her. She has mentioned this to PCP, but she has not yet had any w/u for this.   She notes a hx of colonoscopy over 10 years ago which was normal, but has not updated since then. Her PCP has recommended updating this.     She will be taking a trip with her husband next week that will last 3-3.5 mo. [place] 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 She is looking forward to this trip.     CURRENT MEDICATIONS:  Current Outpatient Medications   Medication Sig Dispense Refill   ??? aspirin (ECOTRIN) 81 MG tablet Take 81 mg by mouth.     ??? blood-glucose meter kit Check blood sugar daily. 1 each 0   ??? canagliflozin (INVOKANA) 300 mg Tab tablet Take 1 tablet (300 mg total) by mouth daily with evening meal. 90 tablet 3   ??? carvediloL (COREG) 12.5 MG tablet Take 1 tablet (12.5 mg total) by mouth 2 (two) times a day with meals. 180 tablet 3   ??? CONTOUR NEXT TEST STRIPS Strp TEST DAILY AS DIRECTED 100 strip 3   ???  cyanocobalamin, vitamin B-12, 1,000 mcg/mL injection Inject 1mL once weekly for 4 weeks, THEN inject 1mL once MONTHLY thereafter. 30 mL 2   ??? ergocalciferol, vitamin D2, (VITAMIN D2 ORAL) Take by mouth.     ??? escitalopram oxalate (LEXAPRO) 20 MG tablet Take 1 tablet (20 mg total) by mouth daily. 90 tablet 4   ??? evolocumab 140 mg/mL PnIj Inject the contents of 1 pen (140 mg) under the skin every fourteen (14) days. 6 mL 3   ??? isosorbide mononitrate (IMDUR) 30 MG 24 hr tablet Take 1 tablet (30 mg total) by mouth daily. 90 tablet 3   ??? lancets (FREESTYLE) 28 gauge Misc USE AS INSTRUCTED 200 each 2   ??? lisinopriL (PRINIVIL,ZESTRIL) 40 MG tablet Take 1 tablet (40 mg total) by mouth daily. 90 tablet 3   ??? LORazepam (ATIVAN) 0.5 MG tablet Take 1 tablet (0.5 mg total) by mouth two (2) times a day as needed for anxiety (or panic attack). 20 tablet 0   ??? metFORMIN (GLUCOPHAGE-XR) 500 MG 24 hr tablet Take 2 tablets (1,000 mg total) by mouth every morning before breakfast AND 1 tablet (500 mg total) at bedtime. 270 tablet 3   ??? methotrexate, PF, (OTREXUP, PF,) 20 mg/0.4 mL AtIn Inject the contents of 1 pen (20 mg) under the skin every seven (7) days. 4.8 mL 3   ??? nitroglycerin (NITROSTAT) 0.4 MG SL tablet PLACE 1 TABLET UNDER THE TONGUE EVERY 5 MINUTES AS NEEDED FOR CHEST PAIN UP TO 3 DOSES THEN CALL 911 IF NO RELIEF     ??? pen needle, diabetic 31 gauge x 5/16 (8 mm) Ndle Use 1-3 times daily as directed. 100 each 3   ??? predniSONE (DELTASONE) 1 MG tablet Take 1-4 pills daily as directed for taper 360 tablet 3   ??? predniSONE (DELTASONE) 5 MG tablet Take 1 and 1/2 tablets (7.5 mg total) by mouth daily. 45 tablet 6   ??? safety needles 25 x 5/8  Ndle Use once a week for 4 weeks for b12 injections then once a month after 4 weeks 100 each 3   ??? syringe with needle (BD LUER-LOK SYRINGE) 3 mL 25 gauge x 1 Syrg Use once a week for 4 weeks for b12 injections then once a month after 4 weeks 100 each 2   ??? TOUJEO SOLOSTAR U-300 INSULIN 300 unit/mL (1.5 mL) injection pen INJECT 25 UNITS UNDER THE SKIN NIGHTLY (Patient taking differently: 28 Units nightly. ) 4.5 mL 0   ??? folic acid (FOLVITE) 1 MG tablet Take 1 mg by mouth. (Patient not taking: Reported on 11/21/2019)       No current facility-administered medications for this visit.       Past Medical History:   Diagnosis Date   ??? Cataract    ??? Diabetes mellitus (CMS-HCC)     Type 2    ??? Hyperlipidemia    ??? Hypertension         Record Review: Available records were reviewed, including pertinent office visits, labs, and imaging.      REVIEW OF SYSTEMS: Ten system were reviewed and negative except as noted above.    PHYSICAL EXAM:  VITAL SIGNS:   Vitals:    11/21/19 1413   BP: 142/80   BP Site: L Arm   BP Position: Sitting   Pulse: 66   Temp: 36.1 ??C (97 ??F)   TempSrc: Temporal   Weight: 77.3 kg (170 lb 6.4 oz) Height: 154.9 cm (5' 1)  General:   Pleasant 63 y.o.female in no acute distress, WDWN   Eyes:   PERRL, conjunctiva and sclera not inflamed. Tears appear adequate. No swelling or tenderness of temporal arteries.    Cardiovascular:  Regular rate and rhythm. No murmur, rub, or gallop. No lower extremity edema.    Lungs:  Clear to auscultation.Normal respiratory effort.    Musculoskeletal:   General: Ambulates w/o assistance. TMJ w/o crepitus   Hands: No overt swelling. Able to make a tight fist. Tenderness of scattered MCPs.   Wrists:Reduced ROM and slight swelling of L.   Elbows: FROM w/o swelling or tenderness   Shoulders: Reduced ROM b/l with pain. No tenderness over musculature of shoulders.   Hips: Full flexion   Knees: FROM w/o effusions   Ankles: No swelling or tenderness    Neurological:  CN 2-12 grossly intact. 5/5 strength on extremities.   Psych:  Appropriate affect and mood   Skin:  No rashes. Wearing a wig.          ASSESSMENT/PLAN:  1. PMR (polymyalgia rheumatica) and ?GCA  Overall does not appear to be able to taper prednisone further at this visit. Some sx concerning for temporal arteritis, though not clear that this is happening.    I recommend beginning therapy with Actemra. I discussed the risks of use to include, injection site/infusion reaction, serious infection, hyperlipidemia, bowel perforation, malignancy. Pt is to see urgent medical care for any signs/symptoms of infection to ensure prompt treatment. Pt verbalizes understanding of these risks, and wishes to proceed with therapy. She would like to use self injection.   However, given new GI sx below, I would recommend this is worked up prior to starting actemra. This is further complicated by pt taking extended vacation for the next 3-3.5 months (coming back late Sept).   For now will increase prednisone to 10 mg qd. Continue otrexup 20 mg qwk. Resume FA 1 mg qd.     - folic acid (FOLVITE) 1 MG tablet; Take 1 tablet (1 mg total) by mouth daily.  Dispense: 90 tablet; Refill: 3  - predniSONE (DELTASONE) 10 MG tablet; Take 1 tablet (10 mg total) by mouth daily.  Dispense: 90 tablet; Refill: 1  - C-reactive protein; Future  - Sedimentation Rate; Future    2. Methotrexate, long term, current use  She is UTD on monitoring labs done last month. Reviewed in office, stable. She will complete labs again as soon as she returns from her trip.   - Albumin; Future  - AST; Future  - ALT; Future  - CBC w/ Differential; Future  - Creatinine; Future    3. Abdominal pain and diarrhea  Unclear etiology. Recommend that she f/u with PCP ASAP for further eval, but she plans to wait until she gets back from her trip.     HCM:   - PCV13 Status: declines   - PPSV 23 Status:declines   - COVID-19 vaccine status: completed   - Annual Influenza vaccine. Status: 04/25/19  - Bone health: Recommend starting calcium and vitamin D supplements. Recommend 1200 mg calcium from both diet and supplements combined and 2000 units vitamin D in supplements along daily.       Return appointment in about 4 mo after her return. She does not have a return date set from her trip, so she will call to schedule in person f/u with me after she returns to [facility].     Greater than 30 minutes spent in visit with patient, including  pre and postvisit activities.

## 2019-11-22 MED ORDER — LISINOPRIL 40 MG TABLET
ORAL_TABLET | 0 refills | 0 days | Status: CP
Start: 2019-11-22 — End: ?

## 2019-11-22 MED ORDER — METFORMIN ER 500 MG TABLET,EXTENDED RELEASE 24 HR
ORAL_TABLET | 0 refills | 0 days | Status: CP
Start: 2019-11-22 — End: ?

## 2019-11-26 ENCOUNTER — Encounter: Admit: 2019-11-26 | Discharge: 2019-11-27 | Payer: PRIVATE HEALTH INSURANCE

## 2019-11-26 MED ORDER — ESCITALOPRAM 20 MG TABLET
ORAL_TABLET | Freq: Every day | ORAL | 0 refills | 90.00000 days | Status: CP
Start: 2019-11-26 — End: ?

## 2019-11-26 MED ADMIN — gadobenate dimeglumine (MULTIHANCE) 529 mg/mL (0.1mmol/0.2mL) solution 8 mL: 8 mL | INTRAVENOUS | @ 19:00:00 | Stop: 2019-11-26

## 2019-11-26 NOTE — Unmapped (Signed)
Needs refill for vacation- needs 90 day supply      Rx #: [id number]   escitalopram oxalate (LEXAPRO) 20 MG tablet [[id number]

## 2019-11-26 NOTE — Unmapped (Signed)
Sent it to [facility] shared pharmacy, did not clarify pharmacy

## 2019-11-28 MED ORDER — ESCITALOPRAM 20 MG TABLET
ORAL_TABLET | Freq: Every day | ORAL | 0 refills | 90.00000 days | Status: CP
Start: 2019-11-28 — End: ?

## 2019-11-28 NOTE — Unmapped (Signed)
Pt wanted her Rx    escitalopram oxalate (LEXAPRO) 20 MG   It was sent to the wrong pharmacy..      Please send to      [facility] [place]. - [place], [place] - [address] Phone:  [phone]   Fax:  [phone]        Pt did not want this to be a mail out Rx.  Pt is going on vacation in the morning.  Rx was called into [facility].    Please change to [facility].

## 2019-12-03 NOTE — Unmapped (Signed)
Message received from patient requesting results of the recent MRI.    Please advise.    tif

## 2019-12-04 NOTE — Unmapped (Signed)
Returned patient phone call. Discussed MRI without any concerning findings. She appreciated phone call.

## 2019-12-05 NOTE — Unmapped (Signed)
Provider discussed results on 12/04/2019

## 2019-12-10 NOTE — Unmapped (Signed)
Madison Harris    Madison Harris, [place]: 02-Jun-1956  Phone: [phone] (home) [phone] (work)    All above HIPAA information was verified with patient.     Was a Nurse, learning disability used for this call? No    Specialty Medication(s):   Inflammatory Disorders: Otrexup and General Specialty: Repatha     Current Outpatient Medications   Medication Sig Dispense Refill   ??? aspirin (ECOTRIN) 81 MG tablet Take 81 mg by mouth.     ??? blood-glucose meter kit Check blood sugar daily. 1 each 0   ??? canagliflozin (INVOKANA) 300 mg Tab tablet Take 1 tablet (300 mg total) by mouth daily with evening meal. 90 tablet 3   ??? carvediloL (COREG) 12.5 MG tablet Take 1 tablet (12.5 mg total) by mouth 2 (two) times a day with meals. 180 tablet 3   ??? CONTOUR NEXT TEST STRIPS Strp TEST DAILY AS DIRECTED 100 strip 3   ??? cyanocobalamin, vitamin B-12, 1,000 mcg/mL injection Inject 1mL once weekly for 4 weeks, THEN inject 1mL once MONTHLY thereafter. 30 mL 2   ??? ergocalciferol, vitamin D2, (VITAMIN D2 ORAL) Take by mouth.     ??? escitalopram oxalate (LEXAPRO) 20 MG tablet Take 1 tablet (20 mg total) by mouth daily. 90 tablet 0   ??? evolocumab 140 mg/mL PnIj Inject the contents of 1 pen (140 mg) under the skin every fourteen (14) days. 6 mL 3   ??? folic acid (FOLVITE) 1 MG tablet Take 1 tablet (1 mg total) by mouth daily. 90 tablet 3   ??? isosorbide mononitrate (IMDUR) 30 MG 24 hr tablet Take 1 tablet (30 mg total) by mouth daily. 90 tablet 3   ??? lancets (FREESTYLE) 28 gauge Misc USE AS INSTRUCTED 200 each 2   ??? lisinopriL (PRINIVIL,ZESTRIL) 40 MG tablet TAKE 1 TABLET BY MOUTH DAILY 90 tablet 0   ??? LORazepam (ATIVAN) 0.5 MG tablet Take 1 tablet (0.5 mg total) by mouth two (2) times a day as needed for anxiety (or panic attack). 20 tablet 0   ??? metFORMIN (GLUCOPHAGE-XR) 500 MG 24 hr tablet TAKE 2 TABLETS BY MOUTH EVERY MORNING AND 1 TABLET NIGHTLY GENERIC EQUIVALENT FOR GLUCOPHAGE- XR 270 tablet 0   ??? methotrexate, PF, (OTREXUP, PF,) 20 mg/0.4 mL AtIn Inject the contents of 1 pen (20 mg) under the skin every seven (7) days. 4.8 mL 3   ??? nitroglycerin (NITROSTAT) 0.4 MG SL tablet PLACE 1 TABLET UNDER THE TONGUE EVERY 5 MINUTES AS NEEDED FOR CHEST PAIN UP TO 3 DOSES THEN CALL 911 IF NO RELIEF     ??? pen needle, diabetic 31 gauge x 5/16 (8 mm) Ndle Use 1-3 times daily as directed. 100 each 3   ??? predniSONE (DELTASONE) 10 MG tablet Take 1 tablet (10 mg total) by mouth daily. 90 tablet 1   ??? safety needles 25 x 5/8  Ndle Use once a week for 4 weeks for b12 injections then once a month after 4 weeks 100 each 3   ??? syringe with needle (BD LUER-LOK SYRINGE) 3 mL 25 gauge x 1 Syrg Use once a week for 4 weeks for b12 injections then once a month after 4 weeks 100 each 2   ??? TOUJEO SOLOSTAR U-300 INSULIN 300 unit/mL (1.5 mL) injection pen INJECT 25 UNITS UNDER THE SKIN NIGHTLY (Patient taking differently: 28 Units nightly. ) 4.5 mL 0     No current facility-administered medications for  this visit.        Changes to medications: Madison Harris reports no changes at this time.    Allergies   Allergen Reactions   ??? Pravastatin Muscle Pain     Other Reaction: myalgia   ??? Semaglutide Diarrhea       Changes to allergies: No    SPECIALTY MEDICATION ADHERENCE     Otrexup 20mg /0.48ml: 56 days of medicine on hand   Repatha 140mg /ml: 56 days of medicine on hand     Medication Adherence    Patient reported X missed doses in the last month: 0  Specialty Medication: Otrexup 20mg /0.35ml  Patient is on additional specialty medications: Yes  Additional Specialty Medications: Repatha 140mg /ml  Patient Reported Additional Medication X Missed Doses in the Last Month: 0          Specialty medication(s) dose(s) confirmed: Regimen is correct and unchanged.     Are there any concerns with adherence? No    Adherence counseling provided? Not needed    CLINICAL MANAGEMENT AND INTERVENTION      Clinical Benefit Assessment: Do you feel the medicine is effective or helping your condition? Yes    Clinical Benefit counseling provided? Not needed    Adverse Effects Assessment:    Are you experiencing any side effects? No    Are you experiencing difficulty administering your medicine? No    Quality of Life Assessment:    Rheumatology:   Quality of Life    On a scale of 1 ??? 10 with 1 representing not at all and 10 representing completely ??? how has your rheumatologic condition affected your:  Daily pain level?: decline to answer  Ability to complete your regular daily tasks (prepare meals, get dressed, etc.)?: decline to answer  Ability to participate in social or family activities?: decline to answer         Have you discussed this with your provider? Not needed    Therapy Appropriateness:    Is therapy appropriate? Yes, therapy is appropriate and should be continued    DISEASE/MEDICATION-SPECIFIC INFORMATION      For patients on injectable medications: Patient currently has 7 Otrexup doses and 4 Repatha doses left.  Next injection is scheduled for both medications later this week.    PATIENT SPECIFIC NEEDS     - Does the patient have any physical, cognitive, or cultural barriers? No    - Is the patient high risk? No     - Does the patient require a Care Management Plan? No     - Does the patient require physician intervention or other additional services (i.e. nutrition, smoking cessation, social work)? No      SHIPPING     Specialty Medication(s) to be Shipped: none    Other medication(s) to be shipped: none       Changes to insurance: No    Delivery Scheduled: Patient declined refill at this time due to having a supply of medication on hand.       The patient will receive a drug information handout for each medication shipped and additional FDA Medication Guides as required.  Verified that patient has previously received a Conservation officer, historic buildings.    All of the patient's questions and concerns have been addressed.    Karene Fry Storie Heffern   [facility] Specialty Pharmacist

## 2020-01-01 MED ORDER — CANAGLIFLOZIN 300 MG TABLET
ORAL_TABLET | Freq: Every day | ORAL | 3 refills | 90 days | Status: CP
Start: 2020-01-01 — End: ?

## 2020-01-01 NOTE — Unmapped (Signed)
Patient called to request a refill for a 90 day supply    canagliflozin (INVOKANA) 300 mg Tab tablet [[id number]]    [facility] Phone:  [phone]   Fax:  [phone]        In care of : [facility]  [address]  [place], [place] [address]

## 2020-01-01 NOTE — Unmapped (Signed)
done

## 2020-01-17 DIAGNOSIS — E1165 Type 2 diabetes mellitus with hyperglycemia: Principal | ICD-10-CM

## 2020-01-17 DIAGNOSIS — M353 Polymyalgia rheumatica: Principal | ICD-10-CM

## 2020-01-17 DIAGNOSIS — Z794 Long term (current) use of insulin: Principal | ICD-10-CM

## 2020-01-17 MED ORDER — ESCITALOPRAM 20 MG TABLET
ORAL_TABLET | Freq: Every day | ORAL | 0 refills | 90.00000 days | Status: CP
Start: 2020-01-17 — End: ?
  Filled 2020-03-27: qty 90, 90d supply, fill #0

## 2020-01-17 MED ORDER — METFORMIN ER 500 MG TABLET,EXTENDED RELEASE 24 HR
ORAL_TABLET | ORAL | 0 refills | 90 days | Status: CP
Start: 2020-01-17 — End: ?

## 2020-01-17 MED ORDER — FOLIC ACID 1 MG TABLET
ORAL_TABLET | Freq: Every day | ORAL | 3 refills | 90 days | Status: CP
Start: 2020-01-17 — End: ?
  Filled 2020-03-27: qty 90, 90d supply, fill #0

## 2020-01-17 MED ORDER — LANCETS
2 refills | 0 days | Status: CP
Start: 2020-01-17 — End: ?

## 2020-01-17 MED ORDER — TOUJEO SOLOSTAR U-300 INSULIN 300 UNIT/ML (1.5 ML) SUBCUTANEOUS PEN
Freq: Every evening | SUBCUTANEOUS | 0 refills | 54.00000 days | Status: CP
Start: 2020-01-17 — End: ?
  Filled 2020-01-20: qty 4.5, 54d supply, fill #0

## 2020-01-17 MED ORDER — LISINOPRIL 40 MG TABLET
ORAL_TABLET | Freq: Every day | ORAL | 0 refills | 90.00000 days | Status: CP
Start: 2020-01-17 — End: ?

## 2020-01-17 MED ORDER — CONTOUR NEXT TEST STRIPS
ORAL_STRIP | 3 refills | 0 days | Status: CP
Start: 2020-01-17 — End: ?

## 2020-01-17 MED FILL — ISOSORBIDE MONONITRATE ER 30 MG TABLET,EXTENDED RELEASE 24 HR: 90 days supply | Qty: 90 | Fill #0 | Status: AC

## 2020-01-17 MED FILL — ISOSORBIDE MONONITRATE ER 30 MG TABLET,EXTENDED RELEASE 24 HR: ORAL | 90 days supply | Qty: 90 | Fill #0

## 2020-01-17 MED FILL — BD LUER-LOK SYRINGE 3 ML 25 GAUGE X 1": 84 days supply | Qty: 3 | Fill #1 | Status: AC

## 2020-01-17 MED FILL — CYANOCOBALAMIN (VIT B-12) 1,000 MCG/ML INJECTION SOLUTION: 84 days supply | Qty: 3 | Fill #1 | Status: AC

## 2020-01-17 MED FILL — BD LUER-LOK SYRINGE 3 ML 25 GAUGE X 1": 84 days supply | Qty: 3 | Fill #1

## 2020-01-17 MED FILL — CYANOCOBALAMIN (VIT B-12) 1,000 MCG/ML INJECTION SOLUTION: 84 days supply | Qty: 3 | Fill #1

## 2020-01-20 MED FILL — TOUJEO SOLOSTAR U-300 INSULIN 300 UNIT/ML (1.5 ML) SUBCUTANEOUS PEN: 54 days supply | Qty: 4 | Fill #0 | Status: AC

## 2020-01-20 NOTE — Unmapped (Signed)
[facility] Clinical Assessment & Refill Coordination Note    Madison Harris, [place]: [birth year]  Phone: [phone] (home) [phone] (work)    All above HIPAA information was verified with patient.     Was a Nurse, learning disability used for this call? No    Specialty Medication(s):   Inflammatory Disorders: Otrexup and General Specialty: Repatha     Current Outpatient Medications   Medication Sig Dispense Refill   ??? aspirin (ECOTRIN) 81 MG tablet Take 81 mg by mouth.     ??? blood sugar diagnostic (CONTOUR NEXT TEST STRIPS) Strp TEST DAILY AS DIRECTED 100 strip 3   ??? blood-glucose meter kit Check blood sugar daily. 1 each 0   ??? canagliflozin (INVOKANA) 300 mg Tab tablet Take 1 tablet (300 mg total) by mouth daily with evening meal. 90 tablet 3   ??? carvediloL (COREG) 12.5 MG tablet Take 1 tablet (12.5 mg total) by mouth 2 (two) times a day with meals. 180 tablet 3   ??? cyanocobalamin, vitamin B-12, 1,000 mcg/mL injection Inject 1mL once weekly for 4 weeks, THEN inject 1mL once MONTHLY thereafter. 30 mL 2   ??? ergocalciferol, vitamin D2, (VITAMIN D2 ORAL) Take by mouth.     ??? escitalopram oxalate (LEXAPRO) 20 MG tablet Take 1 tablet (20 mg total) by mouth daily. 90 tablet 0   ??? evolocumab 140 mg/mL PnIj Inject the contents of 1 pen (140 mg) under the skin every fourteen (14) days. 6 mL 3   ??? folic acid (FOLVITE) 1 MG tablet Take 1 tablet (1 mg total) by mouth daily. 90 tablet 3   ??? insulin glargine U-300 conc (TOUJEO SOLOSTAR U-300 INSULIN) 300 unit/mL (1.5 mL) injection pen Inject 25 Units under the skin nightly. 4.5 mL 0   ??? isosorbide mononitrate (IMDUR) 30 MG 24 hr tablet Take 1 tablet (30 mg total) by mouth daily. 90 tablet 3   ??? lancets Misc Use as directed 200 each 2   ??? lisinopriL (PRINIVIL,ZESTRIL) 40 MG tablet TAKE 1 TABLET BY MOUTH DAILY 90 tablet 0   ??? LORazepam (ATIVAN) 0.5 MG tablet Take 1 tablet (0.5 mg total) by mouth two (2) times a day as needed for anxiety (or panic attack). 20 tablet 0   ??? metFORMIN (GLUCOPHAGE-XR) 500 MG 24 hr tablet Take 2 tablets (1,000 mg total) by mouth every morning AND 1 tablet (500 mg total) nightly. 270 tablet 0   ??? methotrexate, PF, (OTREXUP, PF,) 20 mg/0.4 mL AtIn Inject the contents of 1 pen (20 mg) under the skin every seven (7) days. 4.8 mL 3   ??? nitroglycerin (NITROSTAT) 0.4 MG SL tablet PLACE 1 TABLET UNDER THE TONGUE EVERY 5 MINUTES AS NEEDED FOR CHEST PAIN UP TO 3 DOSES THEN CALL 911 IF NO RELIEF     ??? pen needle, diabetic 31 gauge x 5/16 (8 mm) Ndle Use 1-3 times daily as directed. 100 each 3   ??? predniSONE (DELTASONE) 10 MG tablet Take 1 tablet (10 mg total) by mouth daily. 90 tablet 1   ??? safety needles 25 x 5/8  Ndle Use once a week for 4 weeks for b12 injections then once a month after 4 weeks 100 each 3   ??? syringe with needle (BD LUER-LOK SYRINGE) 3 mL 25 gauge x 1 Syrg Use once a week for 4 weeks for b12 injections then once a month after 4 weeks 100 each 2     No current facility-administered medications for this visit.  Changes to medications: Joselle reports no changes at this time.    Allergies   Allergen Reactions   ??? Pravastatin Muscle Pain     Other Reaction: myalgia   ??? Semaglutide Diarrhea       Changes to allergies: No    SPECIALTY MEDICATION ADHERENCE     Otrexup 20mg /0.82ml: 14 days of medicine on hand   Repatha 140 mg/ml: 14 days of medicine on hand     Medication Adherence    Patient reported X missed doses in the last month: 0  Specialty Medication: Otrexup 20mg /0.34ml  Patient is on additional specialty medications: Yes  Additional Specialty Medications: Repatha 140mg /ml  Patient Reported Additional Medication X Missed Doses in the Last Month: 0          Specialty medication(s) dose(s) confirmed: Regimen is correct and unchanged.     Are there any concerns with adherence? No    Adherence counseling provided? Not needed    CLINICAL MANAGEMENT AND INTERVENTION      Clinical Benefit Assessment:    Do you feel the medicine is effective or helping your condition? Yes    Clinical Benefit counseling provided? Not needed    Adverse Effects Assessment:    Are you experiencing any side effects? No    Are you experiencing difficulty administering your medicine? No    Quality of Life Assessment:    Rheumatology:   Quality of Life    On a scale of 1 ??? 10 with 1 representing not at all and 10 representing completely ??? how has your rheumatologic condition affected your:  Daily pain level?: decline to answer  Ability to complete your regular daily tasks (prepare meals, get dressed, etc.)?: decline to answer  Ability to participate in social or family activities?: decline to answer         Have you discussed this with your provider? Not needed    Therapy Appropriateness:    Is therapy appropriate? Yes, therapy is appropriate and should be continued    DISEASE/MEDICATION-SPECIFIC INFORMATION      For patients on injectable medications: Patient currently has 2 Otrexup and 1 Repatha doses left.  Next injection is scheduled for 01/20/2020 for both.    PATIENT SPECIFIC NEEDS     - Does the patient have any physical, cognitive, or cultural barriers? No    - Is the patient high risk? No    - Does the patient require a Care Management Plan? No     - Does the patient require physician intervention or other additional services (i.e. nutrition, smoking cessation, social work)? No      SHIPPING     Specialty Medication(s) to be Shipped:   Inflammatory Disorders: Otrexup 20mg /0.38ml and General Specialty: Repatha 140mg /ml    Other medication(s) to be shipped: No additional medications requested for fill at this time     Changes to insurance: No    Delivery Scheduled: Yes, Expected medication delivery date: 01/24/2020.     Medication will be delivered via UPS to the confirmed temporary address in [facility].    The patient will receive a drug information handout for each medication shipped and additional FDA Medication Guides as required.  Verified that patient has previously received a Conservation officer, historic buildings.    All of the patient's questions and concerns have been addressed.    Karene Fry Kristy Catoe   [facility] Specialty Pharmacist

## 2020-01-23 MED FILL — REPATHA SURECLICK 140 MG/ML SUBCUTANEOUS PEN INJECTOR: SUBCUTANEOUS | 84 days supply | Qty: 6 | Fill #2

## 2020-01-23 MED FILL — OTREXUP (PF) 20 MG/0.4 ML SUBCUTANEOUS AUTO-INJECTOR: 84 days supply | Qty: 5 | Fill #1 | Status: AC

## 2020-01-23 MED FILL — REPATHA SURECLICK 140 MG/ML SUBCUTANEOUS PEN INJECTOR: 84 days supply | Qty: 6 | Fill #2 | Status: AC

## 2020-01-23 MED FILL — OTREXUP (PF) 20 MG/0.4 ML SUBCUTANEOUS AUTO-INJECTOR: SUBCUTANEOUS | 84 days supply | Qty: 4.8 | Fill #1

## 2020-03-27 DIAGNOSIS — M353 Polymyalgia rheumatica: Principal | ICD-10-CM

## 2020-03-27 MED ORDER — PREDNISONE 10 MG TABLET
ORAL_TABLET | Freq: Every day | ORAL | 1 refills | 90 days | Status: CP
Start: 2020-03-27 — End: ?
  Filled 2020-03-27: qty 90, 90d supply, fill #0

## 2020-03-27 MED FILL — PREDNISONE 10 MG TABLET: 90 days supply | Qty: 90 | Fill #0 | Status: AC

## 2020-03-27 MED FILL — FOLIC ACID 1 MG TABLET: 90 days supply | Qty: 90 | Fill #0 | Status: AC

## 2020-03-27 MED FILL — CARVEDILOL 12.5 MG TABLET: 90 days supply | Qty: 180 | Fill #1 | Status: AC

## 2020-03-27 MED FILL — ESCITALOPRAM 20 MG TABLET: 90 days supply | Qty: 90 | Fill #0 | Status: AC

## 2020-03-27 MED FILL — CARVEDILOL 12.5 MG TABLET: ORAL | 90 days supply | Qty: 180 | Fill #1

## 2020-03-27 NOTE — Unmapped (Signed)
prednisone refill  Last ov: 11/21/2019   Next ov: 05/28/2020

## 2020-03-27 NOTE — Unmapped (Signed)
Reason for call: pt wants to switch her pharmacy (Prednisone ) to [facility] .  Thanks      Last ov: 11/21/2019  Next ov: 05/28/2020

## 2020-04-06 ENCOUNTER — Ambulatory Visit
Admit: 2020-04-06 | Discharge: 2020-04-07 | Payer: PRIVATE HEALTH INSURANCE | Attending: Internal Medicine | Primary: Internal Medicine

## 2020-04-06 DIAGNOSIS — M353 Polymyalgia rheumatica: Principal | ICD-10-CM

## 2020-04-06 DIAGNOSIS — E1165 Type 2 diabetes mellitus with hyperglycemia: Principal | ICD-10-CM

## 2020-04-06 DIAGNOSIS — Z79899 Other long term (current) drug therapy: Principal | ICD-10-CM

## 2020-04-06 DIAGNOSIS — E538 Deficiency of other specified B group vitamins: Principal | ICD-10-CM

## 2020-04-06 DIAGNOSIS — Z1211 Encounter for screening for malignant neoplasm of colon: Principal | ICD-10-CM

## 2020-04-06 DIAGNOSIS — E782 Mixed hyperlipidemia: Principal | ICD-10-CM

## 2020-04-06 DIAGNOSIS — Z23 Encounter for immunization: Principal | ICD-10-CM

## 2020-04-06 DIAGNOSIS — I2510 Atherosclerotic heart disease of native coronary artery without angina pectoris: Principal | ICD-10-CM

## 2020-04-06 DIAGNOSIS — I10 Essential (primary) hypertension: Principal | ICD-10-CM

## 2020-04-06 DIAGNOSIS — Z794 Long term (current) use of insulin: Principal | ICD-10-CM

## 2020-04-06 DIAGNOSIS — Z1212 Encounter for screening for malignant neoplasm of rectum: Secondary | ICD-10-CM

## 2020-04-06 LAB — CBC W/ AUTO DIFF
HEMATOCRIT: 42.8 % (ref 35.0–45.0)
HEMOGLOBIN: 14.6 g/dL (ref 11.3–15.5)
LYMPHOCYTES ABSOLUTE COUNT: 1.8 10*9/L (ref 1.0–4.8)
LYMPHOCYTES RELATIVE PERCENT: 36 %
MEAN CORPUSCULAR HEMOGLOBIN CONC: 34.2 g/dL (ref 31.0–36.0)
MEAN CORPUSCULAR HEMOGLOBIN: 35.4 pg — ABNORMAL HIGH (ref 26.0–32.0)
MEAN CORPUSCULAR VOLUME: 104 fL — ABNORMAL HIGH (ref 89.0–97.0)
MEAN PLATELET VOLUME: 7.5 fL (ref 6.5–8.9)
MONOCYTES ABSOLUTE COUNT: 0.2 10*9/L (ref 0.0–0.8)
NEUTROPHILS RELATIVE PERCENT: 59.1 %
PLATELET COUNT: 185 10*9/L (ref 140–440)
RED BLOOD CELL COUNT: 4.13 10*12/L (ref 3.80–5.10)
RED CELL DISTRIBUTION WIDTH: 14.6 % (ref 11.0–15.0)
WBC ADJUSTED: 5.2 10*9/L (ref 3.8–10.8)

## 2020-04-06 LAB — COMPREHENSIVE METABOLIC PANEL
ALBUMIN/GLOBULIN RATIO: 1.3 (ref 1.1–2.2)
ALBUMIN: 3.8 g/dL (ref 3.5–5.0)
ALKALINE PHOSPHATASE: 58 U/L (ref 50–136)
ALT (SGPT): 20 U/L (ref 12–78)
ANION GAP: 9 mmol/L
BILIRUBIN TOTAL: 0.6 mg/dL (ref 0.0–1.5)
BLOOD UREA NITROGEN: 15 mg/dL (ref 5–26)
BUN / CREAT RATIO: 15 (ref 12–25)
CALCIUM: 9.2 mg/dL (ref 8.5–10.5)
CHLORIDE: 109 mmol/L (ref 95–110)
CREATININE: 0.97 mg/dL (ref 0.50–1.50)
EGFR CKD-EPI AA FEMALE: 72 mL/min/{1.73_m2} (ref >=60)
EGFR CKD-EPI NON-AA FEMALE: 63 mL/min/{1.73_m2} (ref >=60)
GLOBULIN, TOTAL: 2.9 g/dL (ref 1.5–4.3)
GLUCOSE RANDOM: 169 mg/dL — ABNORMAL HIGH (ref 60–100)
POTASSIUM: 4.6 mmol/L (ref 3.5–5.1)
PROTEIN TOTAL: 6.7 g/dL (ref 6.4–8.3)
SODIUM: 146 mmol/L — ABNORMAL HIGH (ref 136–145)

## 2020-04-06 LAB — VITAMIN B-12: Cobalamins:MCnc:Pt:Ser/Plas:Qn:: 735

## 2020-04-06 LAB — LIPID PANEL
CHOLESTEROL/HDL RATIO SCREEN: 1.8 (ref 0.0–4.5)
HDL CHOLESTEROL: 70 mg/dL — ABNORMAL HIGH (ref 40–60)
LDL CHOLESTEROL CALCULATED: 27 mg/dL — ABNORMAL LOW (ref 30–130)
NON-HDL CHOLESTEROL: 56 mg/dL
TRIGLYCERIDES: 144 mg/dL (ref <=150)
VLDL CHOLESTEROL CAL: 28.8 mg/dL (ref 25–40)

## 2020-04-06 LAB — C-REACTIVE PROTEIN: C reactive protein:MCnc:Pt:Ser/Plas:Qn:: 5

## 2020-04-06 LAB — RED CELL DISTRIBUTION WIDTH: Lab: 14.6

## 2020-04-06 LAB — ALBUMIN / CREATININE URINE RATIO
ALBUMIN QUANT URINE: 1 mg/dL
CREATININE, URINE: 70 mg/dL

## 2020-04-06 LAB — ERYTHROCYTE SEDIMENTATION RATE: Lab: 5

## 2020-04-06 LAB — FASTING

## 2020-04-06 LAB — CHLORIDE: Chloride:SCnc:Pt:Ser/Plas:Qn:: 109

## 2020-04-06 LAB — ALBUMIN/CREATININE RATIO: Albumin/Creatinine:MRto:Pt:Urine:Qn:: 14.3

## 2020-04-06 NOTE — Unmapped (Signed)
Please stop your Metformin for a few weeks.  Let me know via MyChart if your diarrhea improves.  Also keep an eye on your blood sugars.    I have referred you for a colonoscopy.

## 2020-04-06 NOTE — Unmapped (Signed)
[facility] INTERNAL MEDICINE  [address] [place], [place] [address]  Ph: [phone], F: [phone]            ASSESSMENT & PLAN   [place]. Booze was seen today for follow-up.    Diagnoses and all orders for this visit:    T2DM, uncontrolled, on insulin  Diarrhea, unspecified  Screening for colorectal cancer  PMR (polymyalgia rheumatica) (CMS-HCC)  Essential hypertension  CAD s/p PCI  B12 deficiency  Overall, she is doing moderately well.  BP is slightly higher than I'd like today. She does have a cuff at home but not checking.  Recommended checking several times weekly.  Recommended taking 2-4 weeks off Metformin to see if it is causing her diarrhea.  Asked her to mychart me how she is doing and how her BG is doing.  Might need increased dose of long acting insulin.  She will also message me with formulary alternatives to Invokana.  Foot exam unremarkable for DM changes, she does have swollen and bruised 4th toe on right where pain is.  Suspect she had trauma but doesn't remember.  Due for labs today and will include B12.    Need for immunization against influenza  -     INFLUENZA INJ MDCK PF, QUAD (FLUCELVAX)(2Y AND UP EGG FREE)        Return in about 4 years (around 04/06/2024) for Annual Physical, Follow-up.    Future Appointments   Date Time Provider Department Center   05/28/2020  3:00 PM Rumey Sherie Don, MD [facility]       HPI     Chief Complaint   Patient presents with   ??? Follow-up     [place] is a 64 y.o. female in today for mostly good.  Stomach issues.  Was having diarrhea, still ongoing.  1-5 times per day.  Starting having about a year ago.  Did have cholecystectomy many years ago and had diarrhea after that but it resolved.  No abdominal pain.  No blood in stool.  Still on steroids and methotrexate for PMR.  Reports got letter from insurance that Invokana no longer preferred and there are two alternatives, but she doesn't know what they are.    Having some right foot pain on pad of foot. No trauma.    I have reviewed and updated the past medical, social, and family history today and updated them in the EMR.    Outpatient Medications Prior to Visit   Medication Sig Dispense Refill   ??? aspirin (ECOTRIN) 81 MG tablet Take 81 mg by mouth.     ??? blood sugar diagnostic (CONTOUR NEXT TEST STRIPS) Strp TEST DAILY AS DIRECTED 100 strip 3   ??? canagliflozin (INVOKANA) 300 mg Tab tablet Take 1 tablet (300 mg total) by mouth daily with evening meal. 90 tablet 3   ??? carvediloL (COREG) 12.5 MG tablet Take 1 tablet (12.5 mg total) by mouth 2 (two) times a day with meals. 180 tablet 3   ??? cyanocobalamin, vitamin B-12, 1,000 mcg/mL injection Inject 1mL once weekly for 4 weeks, THEN inject 1mL once MONTHLY thereafter. 30 mL 2   ??? ergocalciferol, vitamin D2, (VITAMIN D2 ORAL) Take by mouth.     ??? escitalopram oxalate (LEXAPRO) 20 MG tablet Take 1 tablet (20 mg total) by mouth daily. 90 tablet 0   ??? evolocumab 140 mg/mL PnIj Inject the contents of 1 pen (140mg ) under the skin every 14 days 6 mL 3   ???  folic acid (FOLVITE) 1 MG tablet Take 1 tablet (1 mg total) by mouth daily. 90 tablet 3   ??? insulin glargine U-300 conc (TOUJEO SOLOSTAR U-300 INSULIN) 300 unit/mL (1.5 mL) injection pen Inject 25 Units under the skin nightly. 4.5 mL 0   ??? isosorbide mononitrate (IMDUR) 30 MG 24 hr tablet Take 1 tablet (30 mg total) by mouth daily. 90 tablet 3   ??? lancets Misc Use as directed 200 each 2   ??? lisinopriL (PRINIVIL,ZESTRIL) 40 MG tablet TAKE 1 TABLET BY MOUTH DAILY 90 tablet 0   ??? LORazepam (ATIVAN) 0.5 MG tablet Take 1 tablet (0.5 mg total) by mouth two (2) times a day as needed for anxiety (or panic attack). 20 tablet 0   ??? metFORMIN (GLUCOPHAGE-XR) 500 MG 24 hr tablet Take 2 tablets (1,000 mg total) by mouth every morning AND 1 tablet (500 mg total) nightly. 270 tablet 0   ??? methotrexate, PF, (OTREXUP, PF,) 20 mg/0.4 mL AtIn Inject the contents of 1 pen (20mg ) under the skin every 7 days 4.8 mL 3   ??? pen needle, diabetic 31 gauge x 5/16 (8 mm) Ndle Use 1-3 times daily as directed. 100 each 3   ??? predniSONE (DELTASONE) 10 MG tablet Take 1 tablet (10 mg total) by mouth daily. 90 tablet 1   ??? safety needles 25 x 5/8  Ndle Use once a week for 4 weeks for b12 injections then once a month after 4 weeks 100 each 3   ??? syringe with needle (BD LUER-LOK SYRINGE) 3 mL 25 gauge x 1 Syrg Use once a week for 4 weeks for b12 injections then once a month after 4 weeks 100 each 2   ??? blood-glucose meter kit Check blood sugar daily. 1 each 0   ??? nitroglycerin (NITROSTAT) 0.4 MG SL tablet PLACE 1 TABLET UNDER THE TONGUE EVERY 5 MINUTES AS NEEDED FOR CHEST PAIN UP TO 3 DOSES THEN CALL 911 IF NO RELIEF (Patient not taking: Reported on 04/06/2020)       No facility-administered medications prior to visit.     Allergies   Allergen Reactions   ??? Pravastatin Muscle Pain     Other Reaction: myalgia   ??? Semaglutide Diarrhea       EXAM & DATA   T 36.7 ??C (98 ??F) - P 50 - RR   - SpO2 99%  BP   BP Readings from Last 3 Encounters:   04/06/20 138/75   11/21/19 142/80   11/14/19 104/60     Wt   Wt Readings from Last 3 Encounters:   11/21/19 77.3 kg (170 lb 6.4 oz)   11/06/19 77 kg (169 lb 12.8 oz)   07/04/19 75.8 kg (167 lb)    -  Physical Exam  Vitals reviewed.   Constitutional:       General: She is not in acute distress.     Appearance: She is well-developed.   HENT:      Head: Normocephalic and atraumatic.   Eyes:      General: No scleral icterus.     Conjunctiva/sclera: Conjunctivae normal.      Pupils: Pupils are equal, round, and reactive to light.   Pulmonary:      Effort: Pulmonary effort is normal. No respiratory distress.   Musculoskeletal:      Cervical back: Normal range of motion and neck supple.   Skin:     Comments: Bilateral feet with no calluses or ulcers.  Normal sensation.  Right 4th toe bruised and tender, moderately swollen.   Neurological:      Mental Status: She is alert and oriented to person, place, and time.      Coordination: Coordination normal.   Psychiatric:         Behavior: Behavior normal.         Thought Content: Thought content normal.         Judgment: Judgment normal.         Data Reviewed:  Lab Results   Component Value Date    NA 142 07/26/2019    K 4.3 07/26/2019    BUN 20 07/26/2019    CREATININE 0.66 11/06/2019    A1C 7.1 (H) 07/26/2019    CHOL 114 07/26/2019    LDL 21 07/26/2019    HDL 69 (H) 07/26/2019    TRIG 120 07/26/2019    ALT 14 11/06/2019       Lab Results   Component Value Date    A1C 7.1 (H) 07/26/2019    A1C 7.8 (H) 04/25/2019    A1C 9.0 (H) 01/23/2019    A1C 7.9 (H) 11/14/2018    A1C 8.4 (H) 07/25/2018     Lab Results   Component Value Date    TRIG 120 07/26/2019    TRIG 131 07/25/2018     Lab Results   Component Value Date    LDL 21 07/26/2019    LDL 38.8 07/25/2018     Lab Results   Component Value Date    ALT 14 11/06/2019    ALT 17 07/26/2019    ALT 18 04/25/2019    ALT 21 01/23/2019    ALT 17 11/14/2018    ALT 27.0 07/25/2018     No results found for: APOB  No results found for: HSCRP  Lab Results   Component Value Date    ALBCRERAT 20.9 07/26/2019    ALBCRERAT 15.6 07/25/2018     No results found for: FERRITIN, VITD      ----------------------------------------------------------------------------------------------------------------------  This note was dictated with Dragon Medical and may contain typos missed during proofreading.

## 2020-04-07 LAB — HEMOGLOBIN A1C: Hemoglobin A1c/Hemoglobin.total:MFr:Pt:Bld:Qn:: 8 — ABNORMAL HIGH

## 2020-04-17 NOTE — Unmapped (Signed)
[facility] Refill Coordination Note    Specialty Medication(s) to be Shipped:   Inflammatory Disorders: Otrexup and General Specialty: Repatha    Other medication(s) to be shipped: No additional medications requested for fill at this time     [place]ison [place], DOB: 1955/10/19  Phone: [phone] (home) [phone] (work)      All above HIPAA information was verified with patient.     Was a Nurse, learning disability used for this call? No    Completed refill call assessment today to schedule patient's medication shipment from the [facility] [phone]).       Specialty medication(s) and dose(s) confirmed: Regimen is correct and unchanged.   Changes to medications: Anjelita reports no changes at this time.  Changes to insurance: No  Questions for the pharmacist: No    Confirmed patient received Welcome Packet with first shipment. The patient will receive a drug information handout for each medication shipped and additional FDA Medication Guides as required.       DISEASE/MEDICATION-SPECIFIC INFORMATION        For patients on injectable medications: Patient currently has 1 otrexup; 1 repatha doses left.  Next injection is scheduled for 11/1.    SPECIALTY MEDICATION ADHERENCE     Medication Adherence    Patient reported X missed doses in the last month: 0  Specialty Medication: Otrexup  Patient is on additional specialty medications: Yes  Additional Specialty Medications: Repatha  Patient Reported Additional Medication X Missed Doses in the Last Month: 0  Patient is on more than two specialty medications: No  Any gaps in refill history greater than 2 weeks in the last 3 months: no  Demonstrates understanding of importance of adherence: yes  Informant: patient                Otrexup 20mg /0.34ml: Patient has 7 days of medication on hand    Repatha 140mg /ml: Patient has 14 days of medication on hand      SHIPPING     Shipping address confirmed in Epic.     Delivery Scheduled: Yes, Expected medication delivery date: 11/3.     Medication will be delivered via UPS to the temporary address in Epic WAM.    Olga Millers   [facility] Specialty Technician

## 2020-04-21 MED FILL — REPATHA SURECLICK 140 MG/ML SUBCUTANEOUS PEN INJECTOR: SUBCUTANEOUS | 84 days supply | Qty: 6 | Fill #3

## 2020-04-21 MED FILL — OTREXUP (PF) 20 MG/0.4 ML SUBCUTANEOUS AUTO-INJECTOR: 84 days supply | Qty: 5 | Fill #2 | Status: AC

## 2020-04-21 MED FILL — OTREXUP (PF) 20 MG/0.4 ML SUBCUTANEOUS AUTO-INJECTOR: SUBCUTANEOUS | 84 days supply | Qty: 4.8 | Fill #2

## 2020-04-21 MED FILL — REPATHA SURECLICK 140 MG/ML SUBCUTANEOUS PEN INJECTOR: 84 days supply | Qty: 6 | Fill #3 | Status: AC

## 2020-05-08 NOTE — Unmapped (Signed)
[place] Assessment of Medications Program (CAMP)                        RECRUITMENT SUMMARY NOTE       Patient was identified for CAMP Services.  A letter has been sent explaining program services.      Jerolyn Center  Clinical Operations Specialist/CPhT  [employer] [facility] (CAMP)  (P) [phone]

## 2020-05-11 MED ORDER — METFORMIN ER 500 MG TABLET,EXTENDED RELEASE 24 HR
ORAL_TABLET | 1 refills | 0.00000 days | Status: CP
Start: 2020-05-11 — End: 2020-05-28

## 2020-05-11 NOTE — Unmapped (Signed)
Patient is requesting the following refill  Requested Prescriptions     Pending Prescriptions Disp Refills   ??? metFORMIN (GLUCOPHAGE-XR) 500 MG 24 hr tablet [Pharmacy Med Name: METFORMIN ER 500MG  24HR TABS] 270 tablet 0     Sig: TAKE 2 TABLETS BY MOUTH EVERY MORNING AND 1 TABLET NIGHTLY GENERIC EQUIVALENT FOR GLUCOPHAGE- XR       Last OV: 04/06/2020    Next OV: 08/10/2020.     A1c:   Hemoglobin A1C (%)   Date Value   04/06/2020 8.0 (H)   01/23/2019 9.0 (H)     Cr: 0.07 mg/dL

## 2020-05-20 NOTE — Unmapped (Signed)
[place] Assessment of Medications Program (CAMP)                   RECRUITMENT SUMMARY NOTE     Patient was outreached for CAMP Services. Unable to contact- left message.    Benna Dunks [place]  Clinical Operations Specialist/CPhT  [employer] [facility] (CAMP)  (P) [phone]

## 2020-05-21 DIAGNOSIS — E1165 Type 2 diabetes mellitus with hyperglycemia: Principal | ICD-10-CM

## 2020-05-21 DIAGNOSIS — Z794 Long term (current) use of insulin: Principal | ICD-10-CM

## 2020-05-21 NOTE — Unmapped (Signed)
[place] Assessment of Medications Program (CAMP)                   RECRUITMENT SUMMARY NOTE     Patient was outreached for CAMP Services. Unable to contact- left message. Unable to contact letter sent.    Benna Dunks [place]  Clinical Operations Specialist/CPhT  [employer] [facility] (CAMP)  (P) [phone]

## 2020-05-22 MED ORDER — DAPAGLIFLOZIN 10 MG TABLET
ORAL_TABLET | Freq: Every morning | ORAL | 1 refills | 90 days | Status: CP
Start: 2020-05-22 — End: ?
  Filled 2020-05-25: qty 90, 90d supply, fill #0

## 2020-05-22 MED FILL — ISOSORBIDE MONONITRATE ER 30 MG TABLET,EXTENDED RELEASE 24 HR: ORAL | 90 days supply | Qty: 90 | Fill #1

## 2020-05-22 MED FILL — CYANOCOBALAMIN (VIT B-12) 1,000 MCG/ML INJECTION SOLUTION: 84 days supply | Qty: 3 | Fill #2

## 2020-05-22 MED FILL — CYANOCOBALAMIN (VIT B-12) 1,000 MCG/ML INJECTION SOLUTION: 84 days supply | Qty: 3 | Fill #2 | Status: AC

## 2020-05-22 MED FILL — ISOSORBIDE MONONITRATE ER 30 MG TABLET,EXTENDED RELEASE 24 HR: 90 days supply | Qty: 90 | Fill #1 | Status: AC

## 2020-05-22 NOTE — Unmapped (Signed)
Addended byWallace Going ROSE on: 05/22/2020 08:13 AM     Modules accepted: Orders

## 2020-05-25 MED FILL — FARXIGA 10 MG TABLET: 90 days supply | Qty: 90 | Fill #0 | Status: AC

## 2020-05-28 ENCOUNTER — Telehealth: Admit: 2020-05-28 | Discharge: 2020-05-29 | Payer: PRIVATE HEALTH INSURANCE

## 2020-05-28 DIAGNOSIS — M353 Polymyalgia rheumatica: Principal | ICD-10-CM

## 2020-05-28 DIAGNOSIS — Z79899 Other long term (current) drug therapy: Principal | ICD-10-CM

## 2020-05-28 MED ORDER — PREDNISONE 5 MG TABLET
ORAL_TABLET | Freq: Every day | ORAL | 3 refills | 90.00000 days | Status: CP
Start: 2020-05-28 — End: 2020-10-15
  Filled 2020-06-03: qty 252, 84d supply, fill #0

## 2020-05-28 MED ORDER — PREDNISONE 1 MG TABLET
ORAL_TABLET | ORAL | 9 refills | 0.00000 days | Status: CP
Start: 2020-05-28 — End: 2021-03-04

## 2020-05-28 NOTE — Unmapped (Signed)
Taper: 9 mg daily x 4 wks, 8 mg daily x 4 weeks, 7 mg daily x 4 weeks, 6 mg daily x 4 weeks, 5 mg daily x 4 weeks, 4 mg daily x 4 weeks, 3 mg daily x 4 weeks, 2 mg daily x 4 weeks, 1 mg daily x 4 weeks, 1 mg every other day x 4 weeks.

## 2020-05-28 NOTE — Unmapped (Signed)
Patient Name: Madison Harris  PCP: ??Doroteo Bradford, MD  Source of History: patient and records  Date of Visit: 05/28/20 2:32 PM    Identification: Pt self identified using name and date of birth  Patient location: [phone], [place]  The limitations of this telemedicine encounter were discussed with patient. Both the patient and myself agreed to this encounter despite these limitations. Benefits of this telemedicine encounter included allowing for continued care of patient and minimizing risk of exposure to COVID-19. Patient also aware that this is a billable encounter with possible copay.     Chief Compliant: Follow-up for PMR vs RA    PRIOR RHEUMATOLOGIC HISTORY:??hx of ?seronegative RA vs PMR. ??Initially diagnosed with PMR by outside rheumatologist, severe stiffness in hips and neck. 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 Started SQ mtx (otrexup) in 04/2018. Transferred care to our clinic in 09/2018 due to insurance. Mtx increased to 20 mg at that visit. Repeat RF and CCP were negative. Suspect that underlying diagnosis is PMR.  Treatment history:  - Prednisone taper  - Difficulty tapering prednisone, so mtx (otrexup) added 04/2018  - Transferred care to [facility] rheumatology in 09/2018  - Worsening pain again after tapering to 3 mg, increased back to 7.5 mg in 10/2019  - She then reported increasing pain and transient HA, pain with chewing. Recommended further increase in prednisone, but pt wished to remain on 7.5 mg. Did increase to 10 mg in June 2021.   - Current medication regimen: mtx sq (otrexup) 20 mg qwk, FA 1 mg qd, prednisone 10 mg qd.     HPI: Madison Harris is a 64 y.o. female who is being followed at [facility] for PMR vs RA. Last evaluated by me in May 2021 and by Carlus Pavlov [facility] in June 2021. She was scheduled for in person today but switched to virtual visit due to feeling ill. Offered to reschedule for another day but she wanted to have a video visit via [facility].    Today, patient reports having a headaches and feeling achy. No fevers. She reports pain in her back and legs. She has had runny nose and stuffiness as well. Completed COVID-19 rapid test at home that was negative. She started feeling unwell last night and did not feel like herself this morning. She reports that she has been on prednisone 10 mg daily since June 2021. She is also taking Otrexup 20 mg once a week and folic acid 1 mg daily.  She reports that on this regimen she was fine with no significant joint pain, stiffness or swelling. She had no headaches until last night. She was away for four [place] in [place] with [place] issues. She is willing to try to taper down on prednisone. Her hemoglobin A1c did increase to 8.0 on recent lab check.    ROS??: Attests to the above, otherwise, review of all other systems is negative.     ?  Past Medical and Surgical History:  ??  Patient Active Problem List    Diagnosis Date Noted   ??? B12 deficiency 04/25/2019   ??? PMR (polymyalgia rheumatica) (CMS-HCC) 01/23/2019   ??? Current chronic use of systemic steroids 01/23/2019   ??? OSA (obstructive sleep apnea) 01/23/2019   ??? T2DM, uncontrolled, on insulin 07/25/2018   ??? CAD s/p PCI 07/25/2018   ??? Essential hypertension 07/25/2018   ??? Mixed hyperlipidemia 07/25/2018   ??? Personal  history of noncompliance with medical treatment, presenting hazards to health 04/04/2016     Past Surgical History:   Procedure Laterality Date   ??? CARDIAC SURGERY      2 stents.    ??? HYSTERECTOMY       Allergies:   ??  Allergies   Allergen Reactions   ??? Pravastatin Muscle Pain     Other Reaction: myalgia   ??? Semaglutide Diarrhea     Current Outpatient Medications:  ??  Current Outpatient Medications on File Prior to Visit   Medication Sig Dispense Refill   ??? aspirin (ECOTRIN) 81 MG tablet Take 81 mg by mouth.     ??? blood sugar diagnostic (CONTOUR NEXT TEST STRIPS) Strp TEST DAILY AS DIRECTED 100 strip 3   ??? blood-glucose meter kit Check blood sugar daily. 1 each 0   ??? carvediloL (COREG) 12.5 MG tablet Take 1 tablet (12.5 mg total) by mouth 2 (two) times a day with meals. 180 tablet 3   ??? cyanocobalamin, vitamin B-12, 1,000 mcg/mL injection Inject 1mL once weekly for 4 weeks, THEN inject 1mL once MONTHLY thereafter. 30 mL 2   ??? dapagliflozin (FARXIGA) 10 mg Tab tablet Take 1 tablet (10 mg total) by mouth every morning. 90 tablet 1   ??? ergocalciferol, vitamin D2, (VITAMIN D2 ORAL) Take by mouth.     ??? escitalopram oxalate (LEXAPRO) 20 MG tablet Take 1 tablet (20 mg total) by mouth daily. 90 tablet 0   ??? evolocumab 140 mg/mL PnIj Inject the contents of 1 pen (140mg ) under the skin every 14 days 6 mL 3   ??? folic acid (FOLVITE) 1 MG tablet Take 1 tablet (1 mg total) by mouth daily. 90 tablet 3   ??? insulin glargine U-300 conc (TOUJEO SOLOSTAR U-300 INSULIN) 300 unit/mL (1.5 mL) injection pen Inject 25 Units under the skin nightly. 4.5 mL 0   ??? isosorbide mononitrate (IMDUR) 30 MG 24 hr tablet Take 1 tablet (30 mg total) by mouth daily. 90 tablet 3   ??? lancets Misc Use as directed 200 each 2   ??? lisinopriL (PRINIVIL,ZESTRIL) 40 MG tablet TAKE 1 TABLET BY MOUTH DAILY 90 tablet 0   ??? LORazepam (ATIVAN) 0.5 MG tablet Take 1 tablet (0.5 mg total) by mouth two (2) times a day as needed for anxiety (or panic attack). 20 tablet 0   ??? metFORMIN (GLUCOPHAGE-XR) 500 MG 24 hr tablet TAKE 2 TABLETS BY MOUTH EVERY MORNING AND 1 TABLET NIGHTLY GENERIC EQUIVALENT FOR GLUCOPHAGE- XR 270 tablet 1   ??? methotrexate, PF, (OTREXUP, PF,) 20 mg/0.4 mL AtIn Inject the contents of 1 pen (20mg ) under the skin every 7 days 4.8 mL 3   ??? nitroglycerin (NITROSTAT) 0.4 MG SL tablet PLACE 1 TABLET UNDER THE TONGUE EVERY 5 MINUTES AS NEEDED FOR CHEST PAIN UP TO 3 DOSES THEN CALL 911 IF NO RELIEF (Patient not taking: Reported on 04/06/2020)     ??? pen needle, diabetic 31 gauge x 5/16 (8 mm) Ndle Use 1-3 times daily as directed. 100 each 3   ??? predniSONE (DELTASONE) 10 MG tablet Take 1 tablet (10 mg total) by mouth daily. 90 tablet 1   ??? safety needles 25 x 5/8  Ndle Use once a week for 4 weeks for b12 injections then once a month after 4 weeks 100 each 3   ??? syringe with needle (BD LUER-LOK SYRINGE) 3 mL 25 gauge x 1 Syrg Use once a week for 4 weeks for b12 injections  then once a month after 4 weeks 100 each 2     No current facility-administered medications on file prior to visit.     ??  Immunization History   Administered Date(s) Administered   ??? COVID-19 VACCINE,MRNA(MODERNA)(PF)(IM) 08/16/2019, 09/13/2019, 05/19/2020   ??? INFLUENZA INJ MDCK PF, QUAD,(FLUCELVAX)(25MO AND UP EGG FREE) 04/06/2020   ??? INFLUENZA TIV (TRI) PF (IM) 04/26/2013   ??? Influenza Vaccine Quad (IIV4 PF) 68mo+ injectable 04/26/2013, 04/25/2014, 04/22/2015, 07/25/2018, 04/25/2019   ??? Influenza Virus Vaccine, unspecified formulation 04/25/2014, 04/22/2015     ????  PHYSICAL EXAM?? - no VSS as virtual. Limited exam over video camera.  Vital signs: There were no vitals taken for this visit.There is no height or weight on file to calculate BMI.  Gen: Well-developed, well-nourished adult in no apparent distress. AOx4.?  HEENT: Face symmetric????  Lungs: Talking in full sentences  ????  Neuro: Good comprehension/cognition. CN 2-12 intact.    Limited Musculoskeletal Examination:????  ?? Jaw, neck without limited ROM.????  ?? Shoulders, elbows, wrists, hands, fingers:  No deformity, erythema, swelling, or limited ROM.??  Skin: No rashes    LABORATORY - reviewed prior  ????IMAGING???? - reviewed prior    ????GENERAL SUMMARY AND IMPRESSION: ????  ????  In summary, the patient is a 64 y.o. female with PMR vs RA on prednisone 10 mg daily and methotrexate 20 mg once a week. She has underlying DM and hemoglobin A1c has increased recently. She denies any significant joint pain, stiffness or swelling. Has a presumed URI today. Recommend tapering prednisone by 1 mg every 4 weeks. Follow-up in 3 and 6 months with Carlus Pavlov [facility] and 9 months with myself.     RECOMMENDATIONS: ????  ????   Diagnosis ICD-10-CM Associated Orders   1. PMR (polymyalgia rheumatica) (CMS-HCC)  M35.3 predniSONE (DELTASONE) 5 MG tablet     predniSONE (DELTASONE) 1 MG tablet   2. Methotrexate, long term, current use  Z79.899 predniSONE (DELTASONE) 5 MG tablet     predniSONE (DELTASONE) 1 MG tablet     Patient Instructions   Taper: 9 mg daily x 4 wks, 8 mg daily x 4 weeks, 7 mg daily x 4 weeks, 6 mg daily x 4 weeks, 5 mg daily x 4 weeks, 4 mg daily x 4 weeks, 3 mg daily x 4 weeks, 2 mg daily x 4 weeks, 1 mg daily x 4 weeks, 1 mg every other day x 4 weeks.    The patient indicates understanding of these issues and agrees to the plan as outlined above.  Contact information provided for any concerns or questions in the interim.  ??        I spent 20 minutes on the real-time audio and video visit with the patient on the date of service. I spent an additional 10 minutes on pre- and post-visit activities on the date of service.     The patient was not located and I was located within 250 yards of a hospital based location during the real-time audio and video visit. The patient was physically located in [place] or a state in which I am permitted to provide care. The patient and/or parent/guardian understood that s/he may incur co-pays and cost sharing, and agreed to the telemedicine visit. The visit was reasonable and appropriate under the circumstances given the patient's presentation at the time.    The patient and/or parent/guardian has been advised of the potential risks and limitations of this mode of treatment (including, but not limited  to, the absence of in-person examination) and has agreed to be treated using telemedicine. The patient's/patient's family's questions regarding telemedicine have been answered.    If the visit was completed in an ambulatory setting, the patient and/or parent/guardian has also been advised to contact their provider???s office for worsening conditions, and seek emergency medical treatment and/or call 911 if the patient deems either necessary.      Callin Ashe C. Scarlette Calico, MD, PhD  Assistant Professor of Medicine  Department of Medicine/Division of Rheumatology  [facility]  8:08 PM

## 2020-06-03 MED FILL — PREDNISONE 1 MG TABLET: 84 days supply | Qty: 252 | Fill #0 | Status: AC

## 2020-06-03 MED FILL — PREDNISONE 5 MG TABLET: ORAL | 84 days supply | Qty: 84 | Fill #0

## 2020-06-03 MED FILL — PREDNISONE 5 MG TABLET: 84 days supply | Qty: 84 | Fill #0 | Status: AC

## 2020-06-26 NOTE — Unmapped (Signed)
[place] Assessment of Medications Program (CAMP)                    RECRUITMENT SUMMARY NOTE     Patient was unable to be contacted for CAMP recruitment despite multiple attempts. If patient/provider/case management requests pharmacist intervention in the future, please send a referral to CAMP using the AMB Referral to [place] Assessment of Medication Program.    Fayne Mediate  Clinical Operication Specialist/CPHT  [place] Assessment of Medication Program (CAMP)  [phone]

## 2020-07-08 DIAGNOSIS — I2510 Atherosclerotic heart disease of native coronary artery without angina pectoris: Principal | ICD-10-CM

## 2020-07-08 MED ORDER — REPATHA SURECLICK 140 MG/ML SUBCUTANEOUS PEN INJECTOR
SUBCUTANEOUS | 3 refills | 84.00000 days | Status: CN
Start: 2020-07-08 — End: ?

## 2020-07-08 NOTE — Unmapped (Signed)
[facility] Clinical Assessment & Refill Coordination Note    Madison Harris, [place]: [birth year]  Phone: [phone] (home) [phone] (work)    All above HIPAA information was verified with patient.     Was a Nurse, learning disability used for this call? No    Specialty Medication(s):   Inflammatory Disorders: Otrexup and General Specialty: Repatha     Current Outpatient Medications   Medication Sig Dispense Refill   ??? aspirin (ECOTRIN) 81 MG tablet Take 81 mg by mouth.     ??? blood sugar diagnostic (CONTOUR NEXT TEST STRIPS) Strp TEST DAILY AS DIRECTED 100 strip 3   ??? blood-glucose meter kit Check blood sugar daily. 1 each 0   ??? carvediloL (COREG) 12.5 MG tablet Take 1 tablet (12.5 mg total) by mouth 2 (two) times a day with meals. 180 tablet 3   ??? cyanocobalamin, vitamin B-12, 1,000 mcg/mL injection Inject 1mL once weekly for 4 weeks, THEN inject 1mL once MONTHLY thereafter. 30 mL 2   ??? dapagliflozin (FARXIGA) 10 mg Tab tablet Take 1 tablet (10 mg total) by mouth every morning. 90 tablet 1   ??? ergocalciferol, vitamin D2, (VITAMIN D2 ORAL) Take by mouth.     ??? escitalopram oxalate (LEXAPRO) 20 MG tablet Take 1 tablet (20 mg total) by mouth daily. 90 tablet 0   ??? evolocumab 140 mg/mL PnIj Inject the contents of 1 pen (140mg ) under the skin every 14 days 6 mL 3   ??? folic acid (FOLVITE) 1 MG tablet Take 1 tablet (1 mg total) by mouth daily. 90 tablet 3   ??? insulin glargine U-300 conc (TOUJEO SOLOSTAR U-300 INSULIN) 300 unit/mL (1.5 mL) injection pen Inject 25 Units under the skin nightly. 4.5 mL 0   ??? isosorbide mononitrate (IMDUR) 30 MG 24 hr tablet Take 1 tablet (30 mg total) by mouth daily. 90 tablet 3   ??? lancets Misc Use as directed 200 each 2   ??? lisinopriL (PRINIVIL,ZESTRIL) 40 MG tablet TAKE 1 TABLET BY MOUTH DAILY 90 tablet 0   ??? LORazepam (ATIVAN) 0.5 MG tablet Take 1 tablet (0.5 mg total) by mouth two (2) times a day as needed for anxiety (or panic attack). 20 tablet 0   ??? methotrexate, PF, (OTREXUP, PF,) 20 mg/0.4 mL AtIn Inject the contents of 1 pen (20mg ) under the skin every 7 days 4.8 mL 3   ??? nitroglycerin (NITROSTAT) 0.4 MG SL tablet PLACE 1 TABLET UNDER THE TONGUE EVERY 5 MINUTES AS NEEDED FOR CHEST PAIN UP TO 3 DOSES THEN CALL 911 IF NO RELIEF (Patient not taking: Reported on 04/06/2020)     ??? pen needle, diabetic 31 gauge x 5/16 (8 mm) Ndle Use 1-3 times daily as directed. 100 each 3   ??? predniSONE (DELTASONE) 1 MG tablet Take 4 tablets (4 mg) by mouth daily with 1 (5 mg) tablet for 28 days, THEN 3 tablets (3 mg) daily with 1 (5 mg) tablet for 28 days, THEN 2 tablets (2 mg) daily with 1 (5 mg) tablet for 28 days, THEN 1 tablet (1 mg) daily with 1 (5 mg) tablet for 28 days, THEN No dosage for 28 days while continuing with 1 (5 mg) tablet, THEN 4 tablets (4 mg total) daily for 28 days, THEN 3 tablets (3 mg total) daily for 28 days, THEN 2 tablets (2 mg total) daily for 28 days, THEN 1 tablet (1 mg total) daily for 28 days, THEN 1 tablet (1 mg total) every other  day for 28 days. 720 tablet 9   ??? predniSONE (DELTASONE) 5 MG tablet Take 1 tablet (5 mg total) by mouth daily. Take with 1mg  tablets as directed for taper. 90 tablet 3   ??? safety needles 25 x 5/8  Ndle Use once a week for 4 weeks for b12 injections then once a month after 4 weeks 100 each 3   ??? syringe with needle (BD LUER-LOK SYRINGE) 3 mL 25 gauge x 1 Syrg Use once a week for 4 weeks for b12 injections then once a month after 4 weeks 100 each 2     No current facility-administered medications for this visit.        Changes to medications: tapering off Prednisone    Allergies   Allergen Reactions   ??? Pravastatin Muscle Pain     Other Reaction: myalgia   ??? Semaglutide Diarrhea       Changes to allergies: No    SPECIALTY MEDICATION ADHERENCE     Otrexup 20/0.4 mg/ml: 20 days of medicine on hand   Repatha 140 mg: 12 days of medicine on hand       Medication Adherence    Patient reported X missed doses in the last month: 1  Specialty Medication: Otrexup 20 mg/0.4 ml  Patient is on additional specialty medications: Yes  Additional Specialty Medications: Repatha 140 mg/ml  Patient Reported Additional Medication X Missed Doses in the Last Month: 0  Informant: patient  Confirmed plan for next specialty medication refill: delivery by pharmacy  Refills needed for supportive medications: yes, ordered or provider notified          Specialty medication(s) dose(s) confirmed: Regimen is correct and unchanged.     Are there any concerns with adherence? No    Adherence counseling provided? Not needed    CLINICAL MANAGEMENT AND INTERVENTION      Clinical Benefit Assessment:    Do you feel the medicine is effective or helping your condition? Yes    Clinical Benefit counseling provided? Not needed    Adverse Effects Assessment:    Are you experiencing any side effects? No    Are you experiencing difficulty administering your medicine? No    Quality of Life Assessment:    Rheumatology:   Quality of Life    On a scale of 1 ??? 10 with 1 representing not at all and 10 representing completely ??? how has your rheumatologic condition affected your:  Daily pain level?: decline to answer  Ability to complete your regular daily tasks (prepare meals, get dressed, etc.)?: decline to answer  Ability to participate in social or family activities?: decline to answer         Have you discussed this with your provider? Not needed    Therapy Appropriateness:    Is therapy appropriate? Yes, therapy is appropriate and should be continued    DISEASE/MEDICATION-SPECIFIC INFORMATION      For patients on injectable medications: Patient currently has 3 of Otrexup and 0 Repatha doses left.  Next injection is scheduled for Otrexup on 01/24 and Repatha on 07/20/20.    PATIENT SPECIFIC NEEDS     - Does the patient have any physical, cognitive, or cultural barriers? No    - Is the patient high risk? No    - Does the patient require a Care Management Plan? No     - Does the patient require physician intervention or other additional services (i.e. nutrition, smoking cessation, social work)? No  SHIPPING     Specialty Medication(s) to be Shipped:   Inflammatory Disorders: Otrexup and General Specialty: Repatha    Other medication(s) to be shipped: Folic Acid 1mg , BD Luer-Lok Syr     Changes to insurance: No    Delivery Scheduled: Yes, Expected medication delivery date: 07/15/20.  However, Rx request for refills was sent to the provider as there are none remaining.     Medication will be delivered via UPS to the confirmed temporary address in [facility].    The patient will receive a drug information handout for each medication shipped and additional FDA Medication Guides as required.  Verified that patient has previously received a Conservation officer, historic buildings.    All of the patient's questions and concerns have been addressed.    Alezander Dimaano Vangie Bicker   [facility] Specialty Pharmacist

## 2020-07-20 DIAGNOSIS — I2510 Atherosclerotic heart disease of native coronary artery without angina pectoris: Principal | ICD-10-CM

## 2020-07-20 MED ORDER — ESCITALOPRAM 20 MG TABLET
ORAL_TABLET | Freq: Every day | ORAL | 0 refills | 90 days | Status: CP
Start: 2020-07-20 — End: ?
  Filled 2020-07-27: qty 90, 90d supply, fill #0

## 2020-07-20 NOTE — Unmapped (Signed)
Madison Harris 's entire shipment will be delayed as a result of no refills remain on the prescription.  Repatha     I have reached out to the patient and communicated the delay. We will call the patient back to reschedule the delivery upon resolution. We have not confirmed the new delivery date.      Patient is going to reach out to Dr. Oliver Hum office about Repatha also - patient would like all medications shipped together

## 2020-07-20 NOTE — Unmapped (Signed)
Patient is requesting the following refill  Requested Prescriptions     Pending Prescriptions Disp Refills   ??? escitalopram oxalate (LEXAPRO) 20 MG tablet 90 tablet 0     Sig: Take 1 tablet (20 mg total) by mouth daily.       Last OV: 04/06/2020     Last Virtual Visit: Visit date not found     Next OV: 08/10/2020.     Labs: PHQ9: No flowsheet data found.

## 2020-07-23 MED ORDER — REPATHA SURECLICK 140 MG/ML SUBCUTANEOUS PEN INJECTOR
SUBCUTANEOUS | 3 refills | 84 days
Start: 2020-07-23 — End: ?

## 2020-07-24 DIAGNOSIS — I2510 Atherosclerotic heart disease of native coronary artery without angina pectoris: Principal | ICD-10-CM

## 2020-07-24 MED ORDER — EVOLOCUMAB 140 MG/ML SUBCUTANEOUS PEN INJECTOR
SUBCUTANEOUS | 0 refills | 84 days | Status: CP
Start: 2020-07-24 — End: ?
  Filled 2020-07-27: qty 6, 84d supply, fill #0

## 2020-07-27 MED FILL — OTREXUP (PF) 20 MG/0.4 ML SUBCUTANEOUS AUTO-INJECTOR: SUBCUTANEOUS | 84 days supply | Qty: 4.8 | Fill #3

## 2020-07-27 MED FILL — FOLIC ACID 1 MG TABLET: ORAL | 90 days supply | Qty: 90 | Fill #1

## 2020-07-27 MED FILL — BD LUER-LOK SYRINGE 3 ML 25 GAUGE X 1": 84 days supply | Qty: 3 | Fill #2

## 2020-07-27 NOTE — Unmapped (Signed)
Madison Harris 's entire shipment will be sent out  as a result of a new prescription for the medication has been received.  - Repatha    I have reached out to the patient and communicated the delivery change. We will reschedule the medication for the delivery date that the patient agreed upon.  We have confirmed the delivery date as 07/28/2020, via ups.

## 2020-08-10 ENCOUNTER — Encounter
Admit: 2020-08-10 | Discharge: 2020-08-11 | Payer: PRIVATE HEALTH INSURANCE | Attending: Internal Medicine | Primary: Internal Medicine

## 2020-08-10 DIAGNOSIS — Z1211 Encounter for screening for malignant neoplasm of colon: Principal | ICD-10-CM

## 2020-08-10 DIAGNOSIS — E782 Mixed hyperlipidemia: Principal | ICD-10-CM

## 2020-08-10 DIAGNOSIS — I10 Essential (primary) hypertension: Principal | ICD-10-CM

## 2020-08-10 DIAGNOSIS — E1165 Type 2 diabetes mellitus with hyperglycemia: Principal | ICD-10-CM

## 2020-08-10 DIAGNOSIS — I2510 Atherosclerotic heart disease of native coronary artery without angina pectoris: Principal | ICD-10-CM

## 2020-08-10 DIAGNOSIS — Z794 Long term (current) use of insulin: Principal | ICD-10-CM

## 2020-08-10 DIAGNOSIS — M353 Polymyalgia rheumatica: Principal | ICD-10-CM

## 2020-08-10 DIAGNOSIS — Z0000 Encounter for general adult medical examination without abnormal findings: Principal | ICD-10-CM

## 2020-08-10 DIAGNOSIS — Z1212 Encounter for screening for malignant neoplasm of rectum: Principal | ICD-10-CM

## 2020-08-10 LAB — LIPID PANEL
CHOLESTEROL/HDL RATIO SCREEN: 1.9 (ref 0.0–4.5)
CHOLESTEROL: 136 mg/dL (ref 0–200)
HDL CHOLESTEROL: 73 mg/dL — ABNORMAL HIGH (ref 40–60)
LDL CHOLESTEROL CALCULATED: 46 mg/dL (ref 30–130)
NON-HDL CHOLESTEROL: 63 mg/dL
TRIGLYCERIDES: 87 mg/dL (ref <=150)
VLDL CHOLESTEROL CAL: 17.4 mg/dL — ABNORMAL LOW (ref 25–40)

## 2020-08-10 LAB — COMPREHENSIVE METABOLIC PANEL
ALBUMIN: 3.9 g/dL (ref 3.4–5.0)
ALKALINE PHOSPHATASE: 65 U/L (ref 46–116)
ALT (SGPT): 23 U/L (ref 10–49)
ANION GAP: 6 mmol/L (ref 5–14)
AST (SGOT): 19 U/L (ref <=34)
BILIRUBIN TOTAL: 0.6 mg/dL (ref 0.3–1.2)
BLOOD UREA NITROGEN: 16 mg/dL (ref 9–23)
BUN / CREAT RATIO: 22
CALCIUM: 9.8 mg/dL (ref 8.7–10.4)
CHLORIDE: 105 mmol/L (ref 98–107)
CO2: 29 mmol/L (ref 20.0–31.0)
CREATININE: 0.73 mg/dL
EGFR CKD-EPI AA FEMALE: >90 mL/min/{1.73_m2} (ref >=60)
EGFR CKD-EPI NON-AA FEMALE: 89 mL/min/{1.73_m2} (ref >=60)
GLUCOSE RANDOM: 180 mg/dL — ABNORMAL HIGH (ref 70–99)
POTASSIUM: 4.9 mmol/L — ABNORMAL HIGH (ref 3.4–4.5)
PROTEIN TOTAL: 6.6 g/dL (ref 5.7–8.2)
SODIUM: 140 mmol/L (ref 135–145)

## 2020-08-10 LAB — ALBUMIN / CREATININE URINE RATIO
ALBUMIN QUANT URINE: <0.3 mg/dL
CREATININE, URINE: 53 mg/dL

## 2020-08-10 MED ORDER — SEMAGLUTIDE 1 MG/DOSE (2 MG/1.5 ML) SUBCUTANEOUS PEN INJECTOR
SUBCUTANEOUS | 4 refills | 84 days | Status: CP
Start: 2020-08-10 — End: ?
  Filled 2020-08-17: qty 3, 56d supply, fill #0

## 2020-08-10 MED ORDER — SEMAGLUTIDE 0.25 MG OR 0.5 MG (2 MG/1.5 ML) SUBCUTANEOUS PEN INJECTOR
SUBCUTANEOUS | 0 refills | 56 days | Status: CP
Start: 2020-08-10 — End: 2020-10-05

## 2020-08-10 NOTE — Unmapped (Signed)
[facility]  [address] [place], [place] [address]  Ph: [phone], F: [phone]            ASSESSMENT & PLAN   Madison Harris was seen today for annual exam.    Diagnoses and all orders for this visit:    Annual physical exam  Labs ordered as appropriate. Lifestyle issues discussed (including diet/sleep hygiene/exercise/stress management/sexual health/substance use as appropriate), and regular dental and eye exams were encouraged.  Immunizations and health maintenance (cancer screening) all reviewed and ordered as indicated in plan.  Signs and symptoms that should prompt return sooner than scheduled were reviewed with the patient.    Essential hypertension  CAD s/p PCI  T2DM, uncontrolled, on insulin  Mixed hyperlipidemia  Blood pressure is slightly higher than I would like but still at goal.  Likely exacerbated by prednisone.  Remains chest pain-free and has follow-up with cardiology.  Remains on Repatha.  Unfortunately, she did not tolerate Metformin due to GI upset.  She is on a SGLT2.  I like to try GLP-1 again.  Discussed how to dose this with patient.   -     Albumin/creatinine urine ratio; Future  -     Comprehensive Metabolic Panel; Future  -     Lipid Panel; Future  -     Hemoglobin A1c; Future  -     semaglutide (OZEMPIC) 0.25 mg or 0.5 mg(2 mg/1.5 mL) PnIj injection; Inject 0.25 mg under the skin every seven (7) days for 28 days, THEN 0.5 mg every seven (7) days for 28 days.  -     semaglutide 1 mg/dose (2 mg/1.5 mL) PnIj; Inject 1 mg under the skin every seven (7) days.  -     Ambulatory referral to Ophthalmology; Future    PMR (polymyalgia rheumatica) (CMS-HCC)  Continue f/u with Rheumatology.  Feeling very good.  Prednisone management per Rheum.    Screening for colorectal cancer  -     Colonoscopy; Future      Note: Today's visit addressed both an Annual Preventive Exam as documented as well as the separately identifiable problem-based evaluation and management of the diagnoses noted above.    HPI     Chief Complaint   Patient presents with   ??? Annual Exam     Madison Harris is a 65 y.o. female in today for an annual exam.  In addition following up chronic issues.      Remains on prednisone 8 mg daily for PMR.  This is managed by rheumatology.  She is feeling very good on this.  Regarding her diabetes, she remains on Toujeo 28u daily + Farxiga 10mg  daily.  Stopped Metformin due to diarrhea.  Possibly had diarrhea due to Rybelsus in past but was also on metformin at that time.    Has not gotten colonoscopy or eye exam.    I have reviewed and updated the past medical, social, and family history today and updated them in the EMR.    Allergies   Allergen Reactions   ??? Pravastatin Muscle Pain     Other Reaction: myalgia   ??? Semaglutide Diarrhea     Outpatient Medications Prior to Visit   Medication Sig Dispense Refill   ??? aspirin (ECOTRIN) 81 MG tablet Take 81 mg by mouth.     ??? blood sugar diagnostic (CONTOUR NEXT TEST STRIPS) Strp TEST DAILY AS DIRECTED 100 strip 3   ??? carvediloL (COREG) 12.5 MG tablet Take 1 tablet (  12.5 mg total) by mouth 2 (two) times a day with meals. 180 tablet 3   ??? cyanocobalamin, vitamin B-12, 1,000 mcg/mL injection Inject 1mL once weekly for 4 weeks, THEN inject 1mL once MONTHLY thereafter. 30 mL 2   ??? dapagliflozin (FARXIGA) 10 mg Tab tablet Take 1 tablet (10 mg total) by mouth every morning. 90 tablet 1   ??? ergocalciferol, vitamin D2, (VITAMIN D2 ORAL) Take by mouth.     ??? escitalopram oxalate (LEXAPRO) 20 MG tablet Take 1 tablet (20 mg total) by mouth daily. 90 tablet 0   ??? evolocumab 140 mg/mL PnIj Inject the contents of 1 pen (140mg ) under the skin every 14 days 6 mL 0   ??? folic acid (FOLVITE) 1 MG tablet Take 1 tablet (1 mg total) by mouth daily. 90 tablet 3   ??? insulin glargine U-300 conc (TOUJEO SOLOSTAR U-300 INSULIN) 300 unit/mL (1.5 mL) injection pen Inject 25 Units under the skin nightly. 4.5 mL 0   ??? isosorbide mononitrate (IMDUR) 30 MG 24 hr tablet Take 1 tablet (30 mg total) by mouth daily. 90 tablet 3   ??? lancets Misc Use as directed 200 each 2   ??? lisinopriL (PRINIVIL,ZESTRIL) 40 MG tablet TAKE 1 TABLET BY MOUTH DAILY 90 tablet 0   ??? LORazepam (ATIVAN) 0.5 MG tablet Take 1 tablet (0.5 mg total) by mouth two (2) times a day as needed for anxiety (or panic attack). 20 tablet 0   ??? methotrexate, PF, (OTREXUP, PF,) 20 mg/0.4 mL AtIn Inject the contents of 1 pen (20mg ) under the skin every 7 days 4.8 mL 3   ??? nitroglycerin (NITROSTAT) 0.4 MG SL tablet PLACE 1 TABLET UNDER THE TONGUE EVERY 5 MINUTES AS NEEDED FOR CHEST PAIN UP TO 3 DOSES THEN CALL 911 IF NO RELIEF     ??? pen needle, diabetic 31 gauge x 5/16 (8 mm) Ndle Use 1-3 times daily as directed. 100 each 3   ??? predniSONE (DELTASONE) 1 MG tablet Take 4 tablets (4 mg) by mouth daily with 1 (5 mg) tablet for 28 days, THEN 3 tablets (3 mg) daily with 1 (5 mg) tablet for 28 days, THEN 2 tablets (2 mg) daily with 1 (5 mg) tablet for 28 days, THEN 1 tablet (1 mg) daily with 1 (5 mg) tablet for 28 days, THEN No dosage for 28 days while continuing with 1 (5 mg) tablet, THEN 4 tablets (4 mg total) daily for 28 days, THEN 3 tablets (3 mg total) daily for 28 days, THEN 2 tablets (2 mg total) daily for 28 days, THEN 1 tablet (1 mg total) daily for 28 days, THEN 1 tablet (1 mg total) every other day for 28 days. 720 tablet 9   ??? predniSONE (DELTASONE) 5 MG tablet Take 1 tablet (5 mg total) by mouth daily. Take with 1mg  tablets as directed for taper. 90 tablet 3   ??? safety needles 25 x 5/8  Ndle Use once a week for 4 weeks for b12 injections then once a month after 4 weeks 100 each 3   ??? syringe with needle (BD LUER-LOK SYRINGE) 3 mL 25 gauge x 1 Syrg Use once a week for 4 weeks for b12 injections then once a month after 4 weeks 100 each 2   ??? blood-glucose meter kit Check blood sugar daily. 1 each 0     No facility-administered medications prior to visit.     Past Medical History:   Diagnosis Date   ??? Cataract    ???  Diabetes mellitus (CMS-HCC)     Type 2    ??? Hyperlipidemia    ??? Hypertension      Past Surgical History:   Procedure Laterality Date   ??? CARDIAC SURGERY      2 stents.    ??? HYSTERECTOMY       Family History   Problem Relation Age of Onset   ??? Cancer Mother    ??? Cancer Brother    ??? Cancer Maternal Grandmother    ??? Diabetes Maternal Grandmother    ??? Heart disease Maternal Grandmother    ??? Hypertension Maternal Grandmother    ??? Ovarian cancer Neg Hx    ??? Breast cancer Neg Hx      Social History     Tobacco Use   ??? Smoking status: Never Smoker   ??? Smokeless tobacco: Never Used   Substance Use Topics   ??? Alcohol use: Yes   ??? Drug use: Never         Social History     Social History Narrative   ??? Not on file       EXAM & DATA   T   - P 68 - RR   - SpO2 98%  BP   BP Readings from Last 3 Encounters:   08/10/20 135/75   04/06/20 138/75   11/21/19 142/80     Wt   Wt Readings from Last 3 Encounters:   08/10/20 76.8 kg (169 lb 6.4 oz)   11/21/19 77.3 kg (170 lb 6.4 oz)   11/06/19 77 kg (169 lb 12.8 oz)       Physical Exam  Vitals reviewed.   Constitutional:       General: She is not in acute distress.     Appearance: She is well-developed.   HENT:      Head: Normocephalic and atraumatic.      Right Ear: External ear normal.      Left Ear: External ear normal.   Eyes:      General: No scleral icterus.     Conjunctiva/sclera: Conjunctivae normal.      Pupils: Pupils are equal, round, and reactive to light.   Neck:      Thyroid: No thyromegaly.   Cardiovascular:      Rate and Rhythm: Normal rate and regular rhythm.      Heart sounds: Normal heart sounds. No murmur heard.  No friction rub.   Pulmonary:      Effort: Pulmonary effort is normal. No respiratory distress.      Breath sounds: Normal breath sounds. No wheezing or rales.   Abdominal:      General: Bowel sounds are normal. There is no distension.      Palpations: Abdomen is soft.      Tenderness: There is no abdominal tenderness. There is no guarding or rebound.   Musculoskeletal:         General: Normal range of motion.      Cervical back: Normal range of motion and neck supple.   Lymphadenopathy:      Cervical: No cervical adenopathy.   Skin:     General: Skin is warm and dry.      Findings: No rash.   Neurological:      Mental Status: She is alert and oriented to person, place, and time.      Coordination: Coordination normal.   Psychiatric:         Behavior: Behavior normal.  Thought Content: Thought content normal.         Judgment: Judgment normal.         PHQ-2 Score: 0  Screening complete, no depression identified / no further action needed today    Data Reviewed:  Lab Results   Component Value Date    NA 146 (H) 04/06/2020    K 4.6 04/06/2020    CL 109 04/06/2020    CO2 27.9 04/06/2020    BUN 15 04/06/2020    CREATININE 0.97 04/06/2020    CALCIUM 9.2 04/06/2020    AST 11 04/06/2020    ALT 20 04/06/2020    ALKPHOS 58 04/06/2020    ALB 3.4 (L) 07/25/2018    ANIONGAP 9 04/06/2020     Lab Results   Component Value Date    A1C 8.0 (H) 04/06/2020    A1C 7.1 (H) 07/26/2019    A1C 7.8 (H) 04/25/2019     Lab Results   Component Value Date    CHOL 126 04/06/2020    LDL 27 (L) 04/06/2020    HDL 70 (H) 04/06/2020    TRIG 144 04/06/2020     Lab Results   Component Value Date    WBC 5.2 04/06/2020    HGB 14.6 04/06/2020    PLT 185 04/06/2020    MCV 104.0 (H) 04/06/2020     No results found for: FERRITIN, TIBC, IRON  Lab Results   Component Value Date    TSH 0.430 10/03/2019     No results found for: VITD    ----------------------------------------------------------------------------------------------------------------------  This note was dictated with Dragon Medical and may contain typos missed during proofreading.

## 2020-08-10 NOTE — Unmapped (Addendum)
1.Colonoscopy - they will cal you  2. Eye exam - you should call them [facility])    For your Diabetes:  Please start Ozempic once you get it.    1. 0.25mg  once weekly for 4 weeks.  EAT SMALLER MEALS  2. 0.5mg  starting week 5 for 4 weeks.  Please REDUCE your Toujeo dose to 20u daily when you start the 0.5mg  dose.  Keep checking blood sugar daily.  3. 1.0mg  weekly starting week 9 thereafter.

## 2020-08-11 LAB — HEMOGLOBIN A1C
ESTIMATED AVERAGE GLUCOSE: 209 mg/dL
HEMOGLOBIN A1C: 8.9 % — ABNORMAL HIGH (ref 4.9–6.1)

## 2020-08-12 MED ORDER — PEG 3350-ELECTROLYTES 236 GRAM-22.74 GRAM-6.74 GRAM-5.86 GRAM SOLUTION
Freq: Once | ORAL | 0 refills | 1 days | Status: CP
Start: 2020-08-12 — End: 2020-08-12

## 2020-08-12 NOTE — Unmapped (Signed)
Cardiology Follow up visit      Requesting Provider: Virgilio Frees*   Primary Provider: Doroteo Bradford, MD     Reason for Consult:   Routine follow up     Assessment & Plan:  1. 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  Angina and lipid regimen is currently optimized.    -Continue PCSK9 inhibitor  -Continue ASA daily  -Carvedilol 12.5 mg BID with plan to titrate to 25 mg BID as this could aid with glycemic and BP control  -Continue imdur 30    2. HTN: A bit above control range today (147/70)  -Continue lisinopril 40  -Home monitoring, BP diary log, f/u with PCP    RTC 1 year    Elpidio Anis  MD [facility]  Division of Cardiology   [place] [facility] [place] [phone]  Pager [phone]    History of Present Illness:  Madison Harris is a 65 y.o. female with a history of coronary artery disease, who has been referred for evaluation of CAD.    She first presented to cardiology attention in 2012 at which point she had an anterior MI at [facility].  She underwent a DES and a staged stent to an LPL branch.  Since then she had done well until March of 2018 when she presented with chest pain to Dr Clifton James for chest discomfort.  She underwent PCI to her OM3 with symptom resolution.  She has comorbid DM, HTN, HLD with statin intolerance, now on evolocumab with excellent LDL response.  She had severe myalgias on multiple statins.  She also has RA and was started on MTX a couple months ago and is actively titrating down on prednisone, now 9mg  daily.  She has had to switch to [facility] for insurance purposes and is here for a routine follow up.    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  She exercises one or twice weekly by walking or horseback riding without difficulty.    Echo 09/14/2016 Normal LV/RV valvular function.    Madison Harris returns for follow-up.  She has been doing well since most recent visit, no new CV complaints, remains active and independent.  She reports good BP control     Social History:  Social History     Socioeconomic History   ??? Marital status: Married     Spouse name: Not on file   ??? Number of children: Not on file   ??? Years of education: Not on file   ??? Highest education level: Not on file   Occupational History   ??? Occupation: paralegal   Tobacco Use   ??? Smoking status: Never Smoker   ??? Smokeless tobacco: Never Used   Substance and Sexual Activity   ??? Alcohol use: Yes   ??? Drug use: Never   ??? Sexual activity: Not on file   Other Topics Concern   ??? Not on file   Social History Narrative   ??? Not on file     Social Determinants of Health     Financial Resource Strain: Not on file   Food Insecurity: Not on file   Transportation Needs: Not on file   Physical Activity: Not on file   Stress: Not on file   Social Connections: Not on file  Family History:  Family History   Problem Relation Age of Onset   ??? Cancer Mother    ??? Cancer Brother    ??? Cancer Maternal Grandmother    ??? Diabetes Maternal Grandmother    ??? Heart disease Maternal Grandmother    ??? Hypertension Maternal Grandmother    ??? Ovarian cancer Neg Hx    ??? Breast cancer Neg Hx        Review of Systems:  A 12-system review of systems was performed and was negative except as noted in the HPI.    Physical Exam:  VITAL SIGNS:   There were no vitals filed for this visit.  There is no height or weight on file to calculate BMI.  GENERAL: Well appearing NAD   HEENT: PERRL, EOMI, OP clear with MMM.   NECK: Supple without LAD, TM, JVD, or HJR.  RESPIRATORY: CTA bilaterally without wheezes or crackles.  Normal WOB.  CARDIOVASCULAR: RRR without m/r/g.  GASTROINTESTINAL: NABS present.  Soft, NT/ND without HSM.MUSCULOSKELETAL  / EXTREMITIES:  No LE edema.  2+ radial, PT, and DP pulses bilaterally            Scribe Attestation:         This document serves as a record of the services and decisions performed by Elpidio Anis, MD on 08/13/2020. It was created on his behalf by Inez Pilgrim, a trained medical scribe. The creation of this document is based on the provider's statements and observations that were conveyed to the medical scribe during the patient's encounter.     (The information in this document, created by the medical scribe for me, accurately reflects the services I personally performed and the decisions made by me. I have reviewed and approved this document for accuracy.)     Elpidio Anis  MD [facility]  Division of Cardiology   [place] [facility] [phone]  Pager [phone]

## 2020-08-13 ENCOUNTER — Encounter
Admit: 2020-08-13 | Discharge: 2020-08-14 | Payer: PRIVATE HEALTH INSURANCE | Attending: Cardiovascular Disease | Primary: Cardiovascular Disease

## 2020-08-13 MED ORDER — CARVEDILOL 12.5 MG TABLET
ORAL_TABLET | Freq: Two times a day (BID) | ORAL | 3 refills | 90.00000 days | Status: CP
Start: 2020-08-13 — End: 2020-11-11
  Filled 2020-08-17: qty 180, 90d supply, fill #0

## 2020-08-14 DIAGNOSIS — E1165 Type 2 diabetes mellitus with hyperglycemia: Principal | ICD-10-CM

## 2020-08-14 DIAGNOSIS — Z794 Long term (current) use of insulin: Principal | ICD-10-CM

## 2020-08-14 MED FILL — PEG 3350-ELECTROLYTES 236 GRAM-22.74 GRAM-6.74 GRAM-5.86 GRAM SOLUTION: ORAL | 1 days supply | Qty: 4000 | Fill #0

## 2020-08-14 NOTE — Unmapped (Signed)
Keep working to keep your diabetes and blood pressure controlled  Return to see me in 1 year, sooner for any new concerns

## 2020-08-27 ENCOUNTER — Ambulatory Visit: Admit: 2020-08-27 | Discharge: 2020-08-27 | Payer: PRIVATE HEALTH INSURANCE

## 2020-08-27 ENCOUNTER — Encounter: Admit: 2020-08-27 | Discharge: 2020-08-27 | Payer: PRIVATE HEALTH INSURANCE

## 2020-08-27 DIAGNOSIS — M8580 Other specified disorders of bone density and structure, unspecified site: Principal | ICD-10-CM

## 2020-08-27 DIAGNOSIS — M353 Polymyalgia rheumatica: Principal | ICD-10-CM

## 2020-08-27 DIAGNOSIS — Z79899 Other long term (current) drug therapy: Principal | ICD-10-CM

## 2020-08-27 DIAGNOSIS — E559 Vitamin D deficiency, unspecified: Principal | ICD-10-CM

## 2020-08-27 DIAGNOSIS — M0609 Rheumatoid arthritis without rheumatoid factor, multiple sites: Principal | ICD-10-CM

## 2020-08-27 LAB — CBC W/ AUTO DIFF
BASOPHILS ABSOLUTE COUNT: 0 10*9/L (ref 0.0–0.1)
BASOPHILS RELATIVE PERCENT: 0.9 %
EOSINOPHILS ABSOLUTE COUNT: 0.1 10*9/L (ref 0.0–0.5)
EOSINOPHILS RELATIVE PERCENT: 1.4 %
HEMATOCRIT: 41.2 % (ref 34.0–44.0)
HEMOGLOBIN: 14.4 g/dL (ref 11.3–14.9)
LYMPHOCYTES ABSOLUTE COUNT: 1.4 10*9/L (ref 1.1–3.6)
LYMPHOCYTES RELATIVE PERCENT: 25.4 %
MEAN CORPUSCULAR HEMOGLOBIN CONC: 34.8 g/dL (ref 32.0–36.0)
MEAN CORPUSCULAR HEMOGLOBIN: 34.9 pg — ABNORMAL HIGH (ref 25.9–32.4)
MEAN CORPUSCULAR VOLUME: 100.1 fL — ABNORMAL HIGH (ref 77.6–95.7)
MEAN PLATELET VOLUME: 8.8 fL (ref 6.8–10.7)
MONOCYTES ABSOLUTE COUNT: 0.3 10*9/L (ref 0.3–0.8)
MONOCYTES RELATIVE PERCENT: 5.6 %
NEUTROPHILS ABSOLUTE COUNT: 3.6 10*9/L (ref 1.8–7.8)
NEUTROPHILS RELATIVE PERCENT: 66.7 %
NUCLEATED RED BLOOD CELLS: 0 /100{WBCs} (ref <=4)
PLATELET COUNT: 193 10*9/L (ref 150–450)
RED BLOOD CELL COUNT: 4.12 10*12/L (ref 3.95–5.13)
RED CELL DISTRIBUTION WIDTH: 13.9 % (ref 12.2–15.2)
WBC ADJUSTED: 5.4 10*9/L (ref 3.6–11.2)

## 2020-08-27 LAB — C-REACTIVE PROTEIN: C-REACTIVE PROTEIN: 5 mg/L (ref <=10.0)

## 2020-08-27 LAB — SEDIMENTATION RATE: ERYTHROCYTE SEDIMENTATION RATE: 6 mm/h (ref 0–30)

## 2020-08-27 LAB — CALCIUM: CALCIUM: 10 mg/dL (ref 8.7–10.4)

## 2020-08-27 MED ORDER — OTREXUP (PF) 20 MG/0.4 ML SUBCUTANEOUS AUTO-INJECTOR
SUBCUTANEOUS | 3 refills | 84 days | Status: CP
Start: 2020-08-27 — End: ?
  Filled 2020-10-22: qty 4.8, 84d supply, fill #0

## 2020-08-27 MED ORDER — FOLIC ACID 1 MG TABLET
ORAL_TABLET | Freq: Every day | ORAL | 3 refills | 90 days | Status: CP
Start: 2020-08-27 — End: ?
  Filled 2020-11-13: qty 90, 90d supply, fill #0

## 2020-08-27 NOTE — Unmapped (Signed)
REASON FOR VISIT: f/u PMR    HISTORY: [name] is a 65 y.o. 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  Initially diagnosed with PMR by outside rheumatologist, severe stiffness in hips and neck. 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 Started SQ mtx (otrexup) in 04/2018. Transferred care to our clinic in 09/2018 due to insurance. Mtx increased to 20 mg at that visit. Repeat RF and CCP were negative. Suspect that underlying diagnosis is PMR.  Treatment history:  - Prednisone taper  - Difficulty tapering prednisone, so mtx (otrexup) added 04/2018  - Transferred care to [facility] rheumatology in 09/2018  - Worsening pain again after tapering to 3 mg, increased back to 7.5 mg in 10/2019  - She then reported increasing pain and transient HA, pain with chewing. Recommended further increase in prednisone, but pt wished to remain on 7.5 mg.   - Hip XR in 10/2019 revealed ?mass over sacrum, but MRI f/u revealed no sacral mass.   - Worsening HAs, though not clearly GCA, increased pred to 10 mg in 11/2019 with plans to start actemra, delayed due to pt traveling out of state.   - Attempted taper from 10 mg pred in 05/2020  - Current medication regimen: mtx sq (otrexup) 20 mg qwk, FA 1 mg qd, prednisone 7 mg qd.     Interim history:   Presents today for f/u.     She has been slowly tapering prednisone each month as directed.  Currently on 7 mg prednisone since 1 March.  She states she can tell that she is tapering prednisone.  She has more generalized aching, also aching in her wrists and fingers.  More pain in the right hip when she is walking.  overall symptoms are not severe or limiting her activities.  No stiffness in the morning.  Slight headache every morning for the last month.  Wears off after a few minutes.  Not bad enough to take any medication.  Pain is located on the top of the head, not associated with vision changes.  No pain over the temples.  No tenderness of scalp. Intermittent aching of the jaw with chewing for the last few months, but happening sporadically.  Does not describe claudication of the jaw.    No fevers, chills, weight loss.  No interim illness or hospitalization.    At last visit she had mentioned diarrhea, did follow-up with PCP and was found to be result of Metformin.  When Metformin was discontinued the diarrhea resolved.    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  They spent 3 to 4 months in the [place], and now have returned to [place].  Staying just outside their children's homes, alternating between the children.  She really enjoys the freedom and travel she has been able to do with this.      CURRENT MEDICATIONS:  Current Outpatient Medications   Medication Sig Dispense Refill   ??? aspirin (ECOTRIN) 81 MG tablet Take 81 mg by mouth.     ??? blood sugar diagnostic (CONTOUR NEXT TEST STRIPS) Strp TEST DAILY AS DIRECTED 100 strip 3   ??? carvediloL (COREG) 12.5 MG tablet Take 1 tablet (12.5 mg total) by mouth 2 (two) times a day with meals. 180 tablet 3   ??? cyanocobalamin, vitamin B-12, 1,000 mcg/mL injection Inject 1mL once weekly for 4 weeks, THEN inject  1mL once MONTHLY thereafter. 30 mL 2   ??? dapagliflozin (FARXIGA) 10 mg Tab tablet Take 1 tablet (10 mg total) by mouth every morning. 90 tablet 1   ??? ergocalciferol, vitamin D2, (VITAMIN D2 ORAL) Take by mouth.     ??? escitalopram oxalate (LEXAPRO) 20 MG tablet Take 1 tablet (20 mg total) by mouth daily. 90 tablet 0   ??? evolocumab 140 mg/mL PnIj Inject the contents of 1 pen (140mg ) under the skin every 14 days 6 mL 0   ??? folic acid (FOLVITE) 1 MG tablet Take 1 tablet (1 mg total) by mouth daily. 90 tablet 3   ??? insulin glargine U-300 conc (TOUJEO SOLOSTAR U-300 INSULIN) 300 unit/mL (1.5 mL) injection pen Inject 25 Units under the skin nightly. 4.5 mL 0   ??? isosorbide mononitrate (IMDUR) 30 MG 24 hr tablet Take 1 tablet (30 mg total) by mouth daily. 90 tablet 3   ??? lancets Misc Use as directed 200 each 2   ??? lisinopriL (PRINIVIL,ZESTRIL) 40 MG tablet TAKE 1 TABLET BY MOUTH DAILY 90 tablet 0   ??? methotrexate, PF, (OTREXUP, PF,) 20 mg/0.4 mL AtIn Inject the contents of 1 pen (20mg ) under the skin every 7 days 4.8 mL 3   ??? nitroglycerin (NITROSTAT) 0.4 MG SL tablet PLACE 1 TABLET UNDER THE TONGUE EVERY 5 MINUTES AS NEEDED FOR CHEST PAIN UP TO 3 DOSES THEN CALL 911 IF NO RELIEF     ??? pen needle, diabetic 31 gauge x 5/16 (8 mm) Ndle Use 1-3 times daily as directed. 100 each 3   ??? predniSONE (DELTASONE) 1 MG tablet Take 4 tablets (4 mg) by mouth daily with 1 (5 mg) tablet for 28 days, THEN 3 tablets (3 mg) daily with 1 (5 mg) tablet for 28 days, THEN 2 tablets (2 mg) daily with 1 (5 mg) tablet for 28 days, THEN 1 tablet (1 mg) daily with 1 (5 mg) tablet for 28 days, THEN No dosage for 28 days while continuing with 1 (5 mg) tablet, THEN 4 tablets (4 mg total) daily for 28 days, THEN 3 tablets (3 mg total) daily for 28 days, THEN 2 tablets (2 mg total) daily for 28 days, THEN 1 tablet (1 mg total) daily for 28 days, THEN 1 tablet (1 mg total) every other day for 28 days. 720 tablet 9   ??? predniSONE (DELTASONE) 5 MG tablet Take 1 tablet (5 mg total) by mouth daily. Take with 1mg  tablets as directed for taper. 90 tablet 3   ??? safety needles 25 x 5/8  Ndle Use once a week for 4 weeks for b12 injections then once a month after 4 weeks 100 each 3   ??? semaglutide (OZEMPIC) 0.25 mg or 0.5 mg(2 mg/1.5 mL) PnIj injection Inject 0.25 mg under the skin every seven (7) days for 28 days, THEN 0.5 mg every seven (7) days for 28 days. 3 mL 0   ??? semaglutide 1 mg/dose (2 mg/1.5 mL) PnIj Inject 1 mg under the skin every seven (7) days. 9 mL 4   ??? syringe with needle (BD LUER-LOK SYRINGE) 3 mL 25 gauge x 1 Syrg Use once a week for 4 weeks for b12 injections then once a month after 4 weeks 100 each 2   ??? blood-glucose meter kit Check blood sugar daily. 1 each 0   ??? LORazepam (ATIVAN) 0.5 MG tablet Take 1 tablet (0.5 mg total) by mouth two (2) times a day as needed for anxiety (or  panic attack). (Patient not taking: Reported on 08/13/2020) 20 tablet 0     No current facility-administered medications for this visit.       Past Medical History:   Diagnosis Date   ??? Cataract    ??? Diabetes mellitus (CMS-HCC)     Type 2    ??? Hyperlipidemia    ??? Hypertension         Record Review: Available records were reviewed, including pertinent office visits, labs, and imaging.      REVIEW OF SYSTEMS: Ten system were reviewed and negative except as noted above.    PHYSICAL EXAM:  VITAL SIGNS:   Vitals:    08/27/20 1111   BP: 153/76   BP Site: L Arm   BP Position: Sitting   BP Cuff Size: Medium   Pulse: 59   Temp: 36.3 ??C (97.3 ??F)   TempSrc: Temporal   Weight: 78.4 kg (172 lb 12.8 oz)   Height: 157.5 cm (5' 2)     General:   Pleasant 65 y.o.female in no acute distress, WDWN   Eyes:   PERRL, conjunctiva and sclera not inflamed. Tears appear adequate. No swelling or tenderness of temporal arteries.    Cardiovascular:  Regular rate and rhythm. No murmur, rub, or gallop. No lower extremity edema.    Lungs:  Clear to auscultation.Normal respiratory effort.    Musculoskeletal:   General: Ambulates w/o assistance.   Hands: No overt swelling. Able to make a tight fist.  Wrists: Full range of motion without swelling  Elbows: FROM w/o swelling or tenderness   Shoulders: Reduced rotational range of motion of the left with pain.  No tenderness over musculature of shoulders.   Hips: Full flexion   Knees: FROM w/o effusions   Ankles: No swelling or tenderness    Neurological:  CN 2-12 grossly intact. 5/5 strength on extremities.   Psych:  Appropriate affect and mood   Skin:  No rashes. Wearing a wig.          ASSESSMENT/PLAN:    1. 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  Continue tapering prednisone by 1 mg each month.  Continue Otrexup 20 mg subcu weekly, folic acid 1 mg daily.  - folic acid (FOLVITE) 1 MG tablet; Take 1 tablet (1 mg total) by mouth daily.  Dispense: 90 tablet; Refill: 3  - methotrexate, PF, (OTREXUP, PF,) 20 mg/0.4 mL AtIn; Inject the contents of 1 pen (20mg ) under the skin every 7 days  Dispense: 4.8 mL; Refill: 3  - C-reactive protein  - Sedimentation Rate  - Sedimentation Rate; Future  - C-reactive protein; Future    2. Methotrexate, long term, current use  Checking labs below to evaluate for medication toxicity.    - CBC w/ Differential  - Albumin; Future  - AST; Future  - ALT; Future  - CBC w/ Differential; Future  - Creatinine; Future    3.  Osteopenia  Noted on DEXA 11/21/2019, T score L spine +0.5, femur neck -1.6. FRAX score showing 10 yr probability of 13% for of major osteoporotic fracture and a 1.3% for hip fracture.  Thus will not start antiresorptive therapy at this time.  Continue calcium and vitamin D supplementation.    - Vitamin D 25 Hydroxy (25OH D2 + D3)  - Calcium      HCM:   - PCV13 Status: declines   - PPSV 23 Status:declines   - COVID-19 vaccine status:Moderna 08/16/2019, 09/13/2019, 05/19/2020, 08/27/2020, hold  methotrexate x1 dose after vaccine today  - Annual Influenza vaccine. Status: 04/25/19  - Bone health: See note on osteopenia above      Greater than 30 minutes spent in visit with patient, including pre and postvisit activities.  Return in 3 months with myself and in 6 months with Dr Scarlette Calico

## 2020-08-27 NOTE — Unmapped (Addendum)
Labs today. Due again in Late May/early June. Can get my labs when you get labs for Dr Tye Maryland. Let the lab know you have labs from me as well.    COVID vaccine today. Skip one dose of methotrexate after this vaccine today.

## 2020-08-31 LAB — VITAMIN D 25 HYDROXY: VITAMIN D, TOTAL (25OH): 31.7 ng/mL (ref 20.0–80.0)

## 2020-08-31 MED ORDER — ISOSORBIDE MONONITRATE ER 30 MG TABLET,EXTENDED RELEASE 24 HR
ORAL_TABLET | Freq: Every day | ORAL | 3 refills | 90.00000 days
Start: 2020-08-31 — End: ?

## 2020-09-01 MED FILL — FARXIGA 10 MG TABLET: ORAL | 90 days supply | Qty: 90 | Fill #1

## 2020-09-10 ENCOUNTER — Encounter
Admit: 2020-09-10 | Discharge: 2020-09-10 | Payer: PRIVATE HEALTH INSURANCE | Attending: Anesthesiology | Primary: Anesthesiology

## 2020-09-10 ENCOUNTER — Encounter: Admit: 2020-09-10 | Discharge: 2020-09-10 | Payer: PRIVATE HEALTH INSURANCE

## 2020-09-10 MED ADMIN — lidocaine (XYLOCAINE) 20 mg/mL (2 %) injection: INTRAVENOUS | @ 14:00:00 | Stop: 2020-09-10

## 2020-09-10 MED ADMIN — propofoL (DIPRIVAN) injection: INTRAVENOUS | @ 14:00:00 | Stop: 2020-09-10

## 2020-09-10 MED ADMIN — ondansetron (ZOFRAN) injection: INTRAVENOUS | @ 14:00:00 | Stop: 2020-09-10

## 2020-09-10 MED ADMIN — sodium chloride (NS) 0.9 % infusion: 10 mL/h | INTRAVENOUS | @ 14:00:00 | Stop: 2020-09-10

## 2020-09-17 ENCOUNTER — Encounter: Admit: 2020-09-17 | Discharge: 2020-09-18 | Payer: PRIVATE HEALTH INSURANCE

## 2020-09-17 MED FILL — LISINOPRIL 40 MG TABLET: ORAL | 90 days supply | Qty: 90 | Fill #0

## 2020-09-17 NOTE — Unmapped (Signed)
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 On insulin. Last A1c ~9. Discussed importance of excellent control of blood sugar, blood pressure, cholesterol and at least once yearly dilated fundus examination. Discussed goal A1c <7.0.  2) syneresis OU- stable.  3) cataract- not visually significant. Observe.     carbon copy: Dr. Tye Maryland     INTERPRETATION EXTENDED OPHTHALMOSCOPT MACULA (90Dlens)  Macula - right:  Normal, syneresis  Macula - left:  Normal, syneresis

## 2020-09-21 MED ORDER — ISOSORBIDE MONONITRATE ER 30 MG TABLET,EXTENDED RELEASE 24 HR
ORAL_TABLET | Freq: Every day | ORAL | 3 refills | 90.00000 days | Status: CP
Start: 2020-09-21 — End: ?
  Filled 2020-09-29: qty 90, 90d supply, fill #0

## 2020-10-15 MED FILL — PREDNISONE 5 MG TABLET: ORAL | 84 days supply | Qty: 84 | Fill #1

## 2020-10-16 DIAGNOSIS — I2510 Atherosclerotic heart disease of native coronary artery without angina pectoris: Principal | ICD-10-CM

## 2020-10-16 MED ORDER — REPATHA SURECLICK 140 MG/ML SUBCUTANEOUS PEN INJECTOR
SUBCUTANEOUS | 0 refills | 84 days
Start: 2020-10-16 — End: ?

## 2020-10-16 NOTE — Unmapped (Signed)
[facility] Refill Coordination Note    Specialty Medication(s) to be Shipped:   Inflammatory Disorders: Otrexup and Rasuvo    Other medication(s) to be shipped: No additional medications requested for fill at this time     Madison Harris, DOB: 1956-02-21  Phone: [phone] (home) [phone] (work)      All above HIPAA information was verified with patient.     Was a Nurse, learning disability used for this call? No    Completed refill call assessment today to schedule patient's medication shipment from the [facility] [phone]).  All relevant notes have been reviewed.     Specialty medication(s) and dose(s) confirmed: Regimen is correct and unchanged.   Changes to medications: Madison Harris reports no changes at this time.  Changes to insurance: No  New side effects reported not previously addressed with a pharmacist or physician: None reported  Questions for the pharmacist: No    Confirmed patient received a Conservation officer, historic buildings and a Surveyor, mining with first shipment. The patient will receive a drug information handout for each medication shipped and additional FDA Medication Guides as required.       DISEASE/MEDICATION-SPECIFIC INFORMATION        For patients on injectable medications: Patient currently has 1 doses left.  Next injection is scheduled for 5/2.    SPECIALTY MEDICATION ADHERENCE     Medication Adherence    Patient reported X missed doses in the last month: 0  Specialty Medication: Otrexup  Patient is on additional specialty medications: Yes  Additional Specialty Medications: Repatha  Patient Reported Additional Medication X Missed Doses in the Last Month: 0  Patient is on more than two specialty medications: No  Any gaps in refill history greater than 2 weeks in the last 3 months: no  Demonstrates understanding of importance of adherence: yes  Informant: patient              Were doses missed due to medication being on hold? No    Otrexup 20mg /0.55ml: Patient has 7 days of medication on hand  Rasuvo 140mg /ml: Patient has 14 days of medication on hand     REFERRAL TO PHARMACIST     Referral to the pharmacist: Not needed      [facility]     Shipping address confirmed in Epic.     Delivery Scheduled: Yes, Expected medication delivery date: 5/4.  However, Rx request for refills was sent to the provider as there are none remaining.     Medication will be delivered via UPS to the temporary address in Epic WAM.    Madison Harris   [facility] Specialty Technician

## 2020-10-20 DIAGNOSIS — E1165 Type 2 diabetes mellitus with hyperglycemia: Principal | ICD-10-CM

## 2020-10-20 DIAGNOSIS — Z794 Long term (current) use of insulin: Principal | ICD-10-CM

## 2020-10-20 MED ORDER — OZEMPIC 0.25 MG OR 0.5 MG (2 MG/1.5 ML) SUBCUTANEOUS PEN INJECTOR
SUBCUTANEOUS | 0 refills | 84.00000 days
Start: 2020-10-20 — End: 2021-01-18

## 2020-10-20 MED FILL — REPATHA SURECLICK 140 MG/ML SUBCUTANEOUS PEN INJECTOR: SUBCUTANEOUS | 84 days supply | Qty: 6 | Fill #0

## 2020-10-21 DIAGNOSIS — E1165 Type 2 diabetes mellitus with hyperglycemia: Principal | ICD-10-CM

## 2020-10-21 DIAGNOSIS — Z794 Long term (current) use of insulin: Principal | ICD-10-CM

## 2020-10-21 MED ORDER — OZEMPIC 1 MG/DOSE (4 MG/3 ML) SUBCUTANEOUS PEN INJECTOR
SUBCUTANEOUS | 4 refills | 84 days | Status: CP
Start: 2020-10-21 — End: ?

## 2020-10-21 NOTE — Unmapped (Signed)
Checking with patient about current dosage

## 2020-10-21 NOTE — Unmapped (Signed)
Addended by: Doroteo Bradford on: 10/21/2020 10:40 AM     Modules accepted: Orders

## 2020-10-21 NOTE — Unmapped (Signed)
Medication delivery was delayed because we were awaiting refill. lvm with patient, medication delivery has not been scheduled.

## 2020-10-23 DIAGNOSIS — E1165 Type 2 diabetes mellitus with hyperglycemia: Principal | ICD-10-CM

## 2020-10-23 DIAGNOSIS — Z794 Long term (current) use of insulin: Principal | ICD-10-CM

## 2020-10-23 MED ORDER — OZEMPIC 1 MG/DOSE (4 MG/3 ML) SUBCUTANEOUS PEN INJECTOR
SUBCUTANEOUS | 4 refills | 84.00000 days | Status: CP
Start: 2020-10-23 — End: 2020-10-23
  Filled 2020-10-22: qty 3, 28d supply, fill #0

## 2020-10-23 MED ORDER — SEMAGLUTIDE 0.25 MG OR 0.5 MG (2 MG/1.5 ML) SUBCUTANEOUS PEN INJECTOR
SUBCUTANEOUS | 0 refills | 84 days | Status: CP
Start: 2020-10-23 — End: 2020-10-23

## 2020-10-23 NOTE — Unmapped (Signed)
Pt wants a call back about the Ozempic dosage.Marland Kitchen

## 2020-10-23 NOTE — Unmapped (Signed)
Dr. Tye Maryland,    [facility] does not have any Ozympic pens in stock - on back order to be released 11/07/20 from their supplier.    In the meantime, her [facility] has Ozempic and Ms. Wanek is fine with that.    Madison Harris states she has a pen in stock labeled 0.25mg  to 0.5mg  injection. She states she has been dialing 0.5mg .     I asked her to check her blood sugars (Sat/Sun/Mon am) before she increases her next weekly dose (Monday, 10/26/20) to 1.0mg . Has enough on hand.    I'm queuing the Ozempic 1mg  pens with a Sig: take 1mg  one time per week to the correct pharmacy.

## 2020-10-23 NOTE — Unmapped (Signed)
Patient is requesting the following refill  Requested Prescriptions     Pending Prescriptions Disp Refills   ??? semaglutide (OZEMPIC) 1 mg/dose (4 mg/3 mL) PnIj injection 9 mL 4     Sig: Inject 0.5 mg under the skin every seven (7) days.       Recent Visits  Date Type Provider Dept   08/10/20 Office Visit Livingston Diones, MD [facility]   04/06/20 Office Visit Livingston Diones, MD [facility]   11/14/19 Office Visit Livingston Diones, MD [facility]   Showing recent visits within past 365 days with a meds authorizing provider and meeting all other requirements  Future Appointments  Date Type Provider Dept   11/09/20 Appointment Livingston Diones, MD [facility] Medicine   Showing future appointments within next 365 days with a meds authorizing provider and meeting all other requirements       Labs:   A1c:   Hemoglobin A1C (%)   Date Value   08/10/2020 8.9 (H)   01/23/2019 9.0 (H)

## 2020-10-23 NOTE — Unmapped (Signed)
Addended by: Doroteo Bradford on: 10/23/2020 01:20 PM     Modules accepted: Orders

## 2020-10-23 NOTE — Unmapped (Signed)
I spoke with Ms. Overby. She states the pharmacy phoned her and SHE TOLD THEM THE INCORRECT WAY SHE IS TAKING THIS MEDICATION.    Per Dr. Tye Maryland' MyChart Message on 10/21/2020 at 11:17 am: Yes you will stay at that 1mg  dose indefinitely for now.     Due to Ms. Bolyard's confusion (she is still confused on the phone while talking to me today), I will call [facility] on file and give the DR. [name]' Directions to Administer 1mg , one time per week of this medication.

## 2020-10-23 NOTE — Unmapped (Signed)
Addended by: Brain Hilts on: 10/23/2020 12:41 PM     Modules accepted: Orders

## 2020-11-02 ENCOUNTER — Encounter: Admit: 2020-11-02 | Discharge: 2020-11-03 | Payer: PRIVATE HEALTH INSURANCE

## 2020-11-02 LAB — COMPREHENSIVE METABOLIC PANEL
ALBUMIN: 3.9 g/dL (ref 3.4–5.0)
ALKALINE PHOSPHATASE: 65 U/L (ref 46–116)
ALT (SGPT): 19 U/L (ref 10–49)
ANION GAP: 8 mmol/L (ref 5–14)
AST (SGOT): 19 U/L (ref <=34)
BILIRUBIN TOTAL: 0.8 mg/dL (ref 0.3–1.2)
BLOOD UREA NITROGEN: 15 mg/dL (ref 9–23)
BUN / CREAT RATIO: 18
CALCIUM: 9.2 mg/dL (ref 8.7–10.4)
CHLORIDE: 106 mmol/L (ref 98–107)
CO2: 27.2 mmol/L (ref 20.0–31.0)
CREATININE: 0.85 mg/dL — ABNORMAL HIGH
EGFR CKD-EPI AA FEMALE: 84 mL/min/{1.73_m2} (ref >=60)
EGFR CKD-EPI NON-AA FEMALE: 73 mL/min/{1.73_m2} (ref >=60)
GLUCOSE RANDOM: 111 mg/dL — ABNORMAL HIGH (ref 70–99)
POTASSIUM: 4 mmol/L (ref 3.4–4.8)
PROTEIN TOTAL: 6.7 g/dL (ref 5.7–8.2)
SODIUM: 141 mmol/L (ref 135–145)

## 2020-11-02 LAB — ALBUMIN / CREATININE URINE RATIO
ALBUMIN QUANT URINE: 1.1 mg/dL
ALBUMIN/CREATININE RATIO: 7.7 ug/mg (ref 0.0–30.0)
CREATININE, URINE: 142 mg/dL

## 2020-11-02 LAB — HEMOGLOBIN A1C
ESTIMATED AVERAGE GLUCOSE: 146 mg/dL
HEMOGLOBIN A1C: 6.7 % — ABNORMAL HIGH (ref 4.8–5.6)

## 2020-11-02 LAB — CBC W/ AUTO DIFF
BASOPHILS ABSOLUTE COUNT: 0.1 10*9/L (ref 0.0–0.1)
BASOPHILS RELATIVE PERCENT: 1.1 %
EOSINOPHILS ABSOLUTE COUNT: 0.1 10*9/L (ref 0.0–0.5)
EOSINOPHILS RELATIVE PERCENT: 1.9 %
HEMATOCRIT: 43.7 % (ref 34.0–44.0)
HEMOGLOBIN: 14.8 g/dL (ref 11.3–14.9)
LYMPHOCYTES ABSOLUTE COUNT: 1.8 10*9/L (ref 1.1–3.6)
LYMPHOCYTES RELATIVE PERCENT: 32.5 %
MEAN CORPUSCULAR HEMOGLOBIN CONC: 33.9 g/dL (ref 32.0–36.0)
MEAN CORPUSCULAR HEMOGLOBIN: 34.4 pg — ABNORMAL HIGH (ref 25.9–32.4)
MEAN CORPUSCULAR VOLUME: 101.3 fL — ABNORMAL HIGH (ref 77.6–95.7)
MEAN PLATELET VOLUME: 8.3 fL (ref 6.8–10.7)
MONOCYTES ABSOLUTE COUNT: 0.5 10*9/L (ref 0.3–0.8)
MONOCYTES RELATIVE PERCENT: 8.7 %
NEUTROPHILS ABSOLUTE COUNT: 3.1 10*9/L (ref 1.8–7.8)
NEUTROPHILS RELATIVE PERCENT: 55.8 %
NUCLEATED RED BLOOD CELLS: 0 /100{WBCs} (ref <=4)
PLATELET COUNT: 216 10*9/L (ref 150–450)
RED BLOOD CELL COUNT: 4.31 10*12/L (ref 3.95–5.13)
RED CELL DISTRIBUTION WIDTH: 12.9 % (ref 12.2–15.2)
WBC ADJUSTED: 5.5 10*9/L (ref 3.6–11.2)

## 2020-11-02 LAB — LIPID PANEL
CHOLESTEROL/HDL RATIO SCREEN: 2.1 (ref 1.0–4.5)
CHOLESTEROL: 121 mg/dL (ref <=200)
HDL CHOLESTEROL: 58 mg/dL (ref 40–60)
LDL CHOLESTEROL CALCULATED: 37 mg/dL — ABNORMAL LOW (ref 40–99)
NON-HDL CHOLESTEROL: 63 mg/dL — ABNORMAL LOW (ref 70–130)
TRIGLYCERIDES: 130 mg/dL (ref 0–150)
VLDL CHOLESTEROL CAL: 26 mg/dL (ref 11–41)

## 2020-11-02 LAB — SEDIMENTATION RATE: ERYTHROCYTE SEDIMENTATION RATE: 4 mm/h (ref 0–30)

## 2020-11-02 LAB — C-REACTIVE PROTEIN: C-REACTIVE PROTEIN: <4 mg/L (ref <=10.0)

## 2020-11-09 ENCOUNTER — Ambulatory Visit
Admit: 2020-11-09 | Discharge: 2020-11-10 | Payer: PRIVATE HEALTH INSURANCE | Attending: Internal Medicine | Primary: Internal Medicine

## 2020-11-09 DIAGNOSIS — Z794 Long term (current) use of insulin: Principal | ICD-10-CM

## 2020-11-09 DIAGNOSIS — E1165 Type 2 diabetes mellitus with hyperglycemia: Principal | ICD-10-CM

## 2020-11-09 DIAGNOSIS — M79673 Pain in unspecified foot: Principal | ICD-10-CM

## 2020-11-09 DIAGNOSIS — I10 Essential (primary) hypertension: Principal | ICD-10-CM

## 2020-11-09 MED ORDER — BLOOD GLUCOSE TEST STRIPS
ORAL_STRIP | 3 refills | 0 days | Status: CP
Start: 2020-11-09 — End: ?
  Filled 2020-11-13: qty 1, 30d supply, fill #0

## 2020-11-09 MED ORDER — BLOOD-GLUCOSE METER KIT WRAPPER
0 refills | 0 days | Status: CP
Start: 2020-11-09 — End: ?

## 2020-11-09 MED ORDER — OLMESARTAN 40 MG-HYDROCHLOROTHIAZIDE 12.5 MG TABLET
ORAL_TABLET | Freq: Every day | ORAL | 3 refills | 90.00000 days | Status: CP
Start: 2020-11-09 — End: 2021-11-09
  Filled 2020-11-13: qty 90, 90d supply, fill #0

## 2020-11-09 MED ORDER — TOUJEO SOLOSTAR U-300 INSULIN 300 UNIT/ML (1.5 ML) SUBCUTANEOUS PEN
Freq: Every evening | SUBCUTANEOUS | 3 refills | 90 days | Status: CP
Start: 2020-11-09 — End: ?

## 2020-11-09 MED ORDER — ESCITALOPRAM 20 MG TABLET
ORAL_TABLET | Freq: Every day | ORAL | 4 refills | 90.00000 days | Status: CP
Start: 2020-11-09 — End: 2021-11-09
  Filled 2020-11-13: qty 90, 90d supply, fill #0

## 2020-11-09 MED ORDER — LANCETS 28 GAUGE
4 refills | 0 days | Status: CP
Start: 2020-11-09 — End: ?
  Filled 2020-11-13: qty 100, 90d supply, fill #0

## 2020-11-09 NOTE — Unmapped (Signed)
A1c has improved quite a bit from ~9 to 6.7%.  She will continue current medications and continue to monitor FASTING blood glucose.  She will notify me immediately should she have fasting BG (or symptomatic hypoglycemia) < 80.  I would recommend reducing Toujeo dose by 3 units should that happen and continue to reduce 3u every 3 days until FBG in range 80-120.    Lab Results   Component Value Date    A1C 6.7 (H) 11/02/2020    A1C 8.9 (H) 08/10/2020    A1C 8.0 (H) 04/06/2020    A1C 7.1 (H) 07/26/2019    A1C 7.8 (H) 04/25/2019

## 2020-11-09 NOTE — Unmapped (Signed)
BP remains above goal.  Will switch to BenicarHCT 40/12.5mg  and have her f/u with me in 3 months.

## 2020-11-09 NOTE — Unmapped (Addendum)
STOP lisinopril  START BenicarHCT (Olmesartan Hydrochlorothiazide)  Increase your Ozempic to the 1mg  dose once weekly  Keep an eye on your fasting blood sugar closely (daily).  If you develop any lows (<80) or feel like it is low, please reduce your Toujeo by 3 units (to 12u daily) and call me right away.    Follow up with me in 3 months.

## 2020-11-09 NOTE — Unmapped (Signed)
[facility]  [address] [place], [place] [address]  Ph: [phone], F: [phone]    ASSESSMENT & PLAN   Madison Harris. Acocella was seen today for follow-up.    Diagnoses and all orders for this visit:    Essential hypertension  Assessment & Plan:  BP remains above goal.  Will switch to BenicarHCT 40/12.5mg  and have her f/u with me in 3 months.    Orders:  -     olmesartan-hydrochlorothiazide (BENICAR HCT) 40-12.5 mg per tablet; Take 1 tablet by mouth in the morning.  -     Comprehensive metabolic panel; Future    T2DM, uncontrolled, on insulin  Assessment & Plan:  A1c has improved quite a bit from ~9 to 6.7%.  She will continue current medications and continue to monitor FASTING blood glucose.  She will notify me immediately should she have fasting BG (or symptomatic hypoglycemia) < 80.  I would recommend reducing Toujeo dose by 3 units should that happen and continue to reduce 3u every 3 days until FBG in range 80-120.    Lab Results   Component Value Date    A1C 6.7 (H) 11/02/2020    A1C 8.9 (H) 08/10/2020    A1C 8.0 (H) 04/06/2020    A1C 7.1 (H) 07/26/2019    A1C 7.8 (H) 04/25/2019       Orders:  -     Hemoglobin A1c; Future  -     Comprehensive metabolic panel; Future  -     blood-glucose meter kit; Use once daily to check blood sugar as directed by your physician  -     lancets 28 gauge Misc; Use to check blood sugar once daily (fasting)  -     blood sugar diagnostic (GLUCOSE BLOOD) Strp; Use to check blood sugar once daily (fasting)  -     Ambulatory referral to Podiatry; Future  -     insulin glargine U-300 conc (TOUJEO SOLOSTAR U-300 INSULIN) 300 unit/mL (1.5 mL) injection pen; Inject 15 Units under the skin nightly.    Pain of foot, unspecified laterality  -     Ambulatory referral to Podiatry; Future    Other orders  -     escitalopram oxalate (LEXAPRO) 20 MG tablet; Take 1 tablet (20 mg total) by mouth in the morning.      HPI     Chief Complaint   Patient presents with   ??? Follow-up Madison Harris is a 65 y.o. female in today for followup of HTN and DM2. Last visit with me was 08/10/20.  At that visit, I started her on Ozempic.  She's had some confusion as to the dosing.  She also remains on Farxiga and Insulin (Toujeo 25u at bedtime).  She has not been tolerant of Metformin in past.    Currently on 0.5mg  dose x ~4-5 weeks.  Due for increase to 1mg  dose today.  Tolerating without any notable side effects.  BG has been pretty good.  FBG ~110s.  Has reduced Toujeo to 15u daily since started Ozempic.  Her meter has died and needs a new one.    Has also had numerous BPs above goal in the past 3 months (Cardiology, Ophthalmology, Rheumatology).  She is on monotherapy with Lisinopril 40mg  daily.     I have reviewed and updated the past medical, social, and family history today and updated them in the EMR.    EXAM & DATA   T   - P 72 -  RR   - SpO2 97%  BP   BP Readings from Last 3 Encounters:   11/09/20 148/82   09/10/20 141/70   08/27/20 153/76     Wt   Wt Readings from Last 3 Encounters:   09/10/20 77.1 kg (170 lb)   08/27/20 78.4 kg (172 lb 12.8 oz)   08/13/20 78.7 kg (173 lb 6.4 oz)     Data Reviewed:  Lab Results   Component Value Date    NA 141 11/02/2020    K 4.0 11/02/2020    BUN 15 11/02/2020    CREATININE 0.85 (H) 11/02/2020    A1C 6.7 (H) 11/02/2020    CHOL 121 11/02/2020    LDL 37 (L) 11/02/2020    HDL 58 11/02/2020    TRIG 161 11/02/2020    ALT 19 11/02/2020       Lab Results   Component Value Date    A1C 6.7 (H) 11/02/2020    A1C 8.9 (H) 08/10/2020    A1C 8.0 (H) 04/06/2020    A1C 7.1 (H) 07/26/2019    A1C 7.8 (H) 04/25/2019    A1C 9.0 (H) 01/23/2019    A1C 7.9 (H) 11/14/2018    A1C 8.4 (H) 07/25/2018     Lab Results   Component Value Date    TRIG 130 11/02/2020    TRIG 87 08/10/2020    TRIG 144 04/06/2020    TRIG 120 07/26/2019    TRIG 131 07/25/2018     Lab Results   Component Value Date    LDL 37 (L) 11/02/2020    LDL 46 08/10/2020    LDL 27 (L) 04/06/2020    LDL 21 07/26/2019    LDL 38.8 07/25/2018     Lab Results   Component Value Date    ALT 19 11/02/2020    ALT 23 08/10/2020    ALT 20 04/06/2020    ALT 14 11/06/2019    ALT 17 07/26/2019    ALT 18 04/25/2019    ALT 21 01/23/2019    ALT 17 11/14/2018    ALT 27.0 07/25/2018     No results found for: APOB  No results found for: HSCRP  Lab Results   Component Value Date    ALBCRERAT 7.7 11/02/2020    ALBCRERAT  08/10/2020      Comment:      Unable to calculate due to value below lower limit of assay linearity.    ALBCRERAT 14.3 04/06/2020    ALBCRERAT 20.9 07/26/2019    ALBCRERAT 15.6 07/25/2018     No results found for: FERRITIN, VITD    ----------------------------------------------------------------------------------------------------------------------  This note was dictated with Dragon Medical and may contain typos missed during proofreading.

## 2020-11-11 NOTE — Unmapped (Signed)
I sent an updated Rx for larger quantity. ??It???s the same dose. ??It???s in the system so what???s on her med list is why pharmacy should dispense. ??Dose is 1mg  weekly    LM with patient advising of the new script for larger quantity sent in to pharmacy. Dose is 1mg  per week.    Waunita Schooner, RN

## 2020-11-11 NOTE — Unmapped (Signed)
Patient called and said she had trouble with the script. She said that her :    semaglutide (OZEMPIC) 1 mg/dose (4 mg/3 mL) PnIj injection [    The pharmacy said it was canceled by the doctors office and she was under the impression that Dr Tye Maryland wanted this for her. Doctor's office should reorder the script to the Pharmacy and she says it is suppose to be for 90 days. [facility] [employer].

## 2020-11-13 DIAGNOSIS — Z794 Long term (current) use of insulin: Principal | ICD-10-CM

## 2020-11-13 DIAGNOSIS — E1165 Type 2 diabetes mellitus with hyperglycemia: Principal | ICD-10-CM

## 2020-11-13 MED ORDER — OZEMPIC 1 MG/DOSE (4 MG/3 ML) SUBCUTANEOUS PEN INJECTOR
SUBCUTANEOUS | 4 refills | 84.00000 days | Status: CP
Start: 2020-11-13 — End: ?
  Filled 2020-11-17: qty 9, 84d supply, fill #0

## 2020-11-13 MED FILL — ACCU-CHEK GUIDE TEST STRIPS: 50 days supply | Qty: 50 | Fill #0

## 2020-11-18 ENCOUNTER — Ambulatory Visit: Admit: 2020-11-18 | Discharge: 2020-11-19 | Payer: PRIVATE HEALTH INSURANCE

## 2020-11-18 DIAGNOSIS — E559 Vitamin D deficiency, unspecified: Principal | ICD-10-CM

## 2020-11-18 DIAGNOSIS — M0600 Rheumatoid arthritis without rheumatoid factor, unspecified site: Principal | ICD-10-CM

## 2020-11-18 DIAGNOSIS — Z79899 Other long term (current) drug therapy: Principal | ICD-10-CM

## 2020-11-18 DIAGNOSIS — M791 Myalgia, unspecified site: Principal | ICD-10-CM

## 2020-11-18 DIAGNOSIS — M353 Polymyalgia rheumatica: Principal | ICD-10-CM

## 2020-11-18 LAB — CK: CREATINE KINASE TOTAL: 36 U/L

## 2020-11-18 MED ORDER — ACTEMRA 162 MG/0.9 ML SUBCUTANEOUS SYRINGE
SUBCUTANEOUS | 3 refills | 84.00000 days | Status: CP
Start: 2020-11-18 — End: ?

## 2020-11-18 NOTE — Unmapped (Signed)
REASON FOR VISIT: f/u PMR    HISTORY: Ms. Rogstad is a 64 y.o. 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  Initially diagnosed with PMR by outside rheumatologist, severe stiffness in hips and neck. 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 Started SQ mtx (otrexup) in 04/2018. Transferred care to our clinic in 09/2018 due to insurance. Mtx increased to 20 mg at that visit. Repeat RF and CCP were negative. Suspect that underlying diagnosis is PMR.  Treatment history:  - Prednisone taper  - Difficulty tapering prednisone, so mtx (otrexup) added 04/2018  - Transferred care to [facility] rheumatology in 09/2018  - Worsening pain again after tapering to 3 mg, increased back to 7.5 mg in 10/2019  - She then reported increasing pain and transient HA, pain with chewing. Recommended further increase in prednisone, but pt wished to remain on 7.5 mg.   - Hip XR in 10/2019 revealed ?mass over sacrum, but MRI f/u revealed no sacral mass.   - Worsening HAs, though not clearly GCA, increased pred to 10 mg in 11/2019 with plans to start actemra, delayed due to pt traveling out of state.   - Attempted taper from 10 mg pred in 05/2020  - Current medication regimen: mtx sq (otrexup) 20 mg qwk, FA 1 mg qd, prednisone 7 mg qd.     Interim history:   Presents today for f/u. Last seen 08/27/20 by me.    She has been slowly tapering prednisone each month as directed.  Currently on 7 mg prednisone since 1 March.  She states she can tell that she is tapering prednisone.  She has more generalized aching, also aching in her wrists and fingers.  More pain in the right hip when she is walking.  overall symptoms are not severe or limiting her activities.  No stiffness in the morning.    She decreased prednisone to 6mg  in May and couldn't function, developed worsening pain in her hips, feet, fingers, distal forearms (with associated swelling), and neck pain. Denies joint swelling. About a week ago she increased prednisone dose back to 7mg  and has noticed improvement, but not back to how it was and continues to not be very tolerable. She takes tylenol when pain wakes her up at night with some improvement. She occasionally takes aleve when pain is at its worst with significant improvement. Describes the pain as muscle aches, except the hip pain is described as in the joint. Hip pain is constant, R>L. Pain worse when first standing after sitting 20-30 min and improves after a few steps. Denies significant am stiffness. Pain also worsens with extended activity, like walking. She was walking 7 laps twice a day, but had to stop due to hip pain. She sometimes can walk a couple laps, but not consistently. She reports left hand weakness she noticed with difficulty holding and pouring a pot of water. Denies loss of dexterity.    Slight headache every morning for the last month.  Wears off after a few minutes.  Not bad enough to take any medication.  Pain is located on the top of the head, not associated with vision changes.  No pain over the temples.  No tenderness of scalp.  Intermittent aching of the jaw with chewing for the last few months, but happening sporadically.  Does not describe claudication of the jaw.    Reports HA improvement. She continues to have most nights but resolves with tylenol and sleep. Denies  jaw claudication, aching with chewing, vision changes, scalp tenderness.    No rashes, fevers, chills, weight loss.  No interim illness or hospitalization.    Diarrhea very improved since stopping metformin. Still ocasional diarrhea. Recent colonoscopy was normal, follow up in 60yrs for next one.    No longer taking lisinopril for htn, switched to benicar hct.      CURRENT MEDICATIONS:  Current Outpatient Medications   Medication Sig Dispense Refill   ??? aspirin (ECOTRIN) 81 MG tablet Take 81 mg by mouth.     ??? blood sugar diagnostic (CONTOUR NEXT TEST STRIPS) Strp TEST DAILY AS DIRECTED 100 strip 3   ??? blood sugar diagnostic (ACCU-CHEK GUIDE TEST STRIPS) Strp Use to check blood sugar once daily (fasting) 100 each 3   ??? blood-glucose meter kit Check blood sugar daily. 1 each 0   ??? blood-glucose meter Misc Use once daily to check blood sugar as directed by your physician 1 each 0   ??? carvediloL (COREG) 12.5 MG tablet Take 1 tablet (12.5 mg total) by mouth 2 (two) times a day with meals. 180 tablet 3   ??? cyanocobalamin, vitamin B-12, 1,000 mcg/mL injection Inject 1mL once weekly for 4 weeks, THEN inject 1mL once MONTHLY thereafter. 30 mL 2   ??? dapagliflozin (FARXIGA) 10 mg Tab tablet Take 1 tablet (10 mg total) by mouth every morning. 90 tablet 1   ??? ergocalciferol, vitamin D2, (VITAMIN D2 ORAL) Take by mouth.     ??? escitalopram oxalate (LEXAPRO) 20 MG tablet Take 1 tablet (20 mg total) by mouth in the morning. 90 tablet 4   ??? evolocumab (REPATHA SURECLICK) 140 mg/mL PnIj Inject the contents of 1 pen (140mg ) under the skin every 14 days. Appointment needed for future refills. 6 mL 0   ??? folic acid (FOLVITE) 1 MG tablet Take 1 tablet (1 mg total) by mouth daily. 90 tablet 3   ??? insulin glargine U-300 conc (TOUJEO SOLOSTAR U-300 INSULIN) 300 unit/mL (1.5 mL) injection pen Inject 15 Units under the skin nightly. 4.5 mL 3   ??? isosorbide mononitrate (IMDUR) 30 MG 24 hr tablet Take 1 tablet (30 mg total) by mouth daily. 90 tablet 3   ??? lancets Misc Use to check blood sugar once daily (fasting) 100 each 4   ??? lancets Misc Use as directed 200 each 2   ??? methotrexate, PF, (OTREXUP, PF,) 20 mg/0.4 mL AtIn Inject the contents of 1 pen (20mg ) under the skin every 7 days 4.8 mL 3   ??? nitroglycerin (NITROSTAT) 0.4 MG SL tablet PLACE 1 TABLET UNDER THE TONGUE EVERY 5 MINUTES AS NEEDED FOR CHEST PAIN UP TO 3 DOSES THEN CALL 911 IF NO RELIEF     ??? olmesartan-hydrochlorothiazide (BENICAR HCT) 40-12.5 mg per tablet Take 1 tablet by mouth in the morning. 90 tablet 3   ??? pen needle, diabetic 31 gauge x 5/16 (8 mm) Ndle Use 1-3 times daily as directed. 100 each 3   ??? predniSONE (DELTASONE) 1 MG tablet Take 4 tablets (4 mg) by mouth daily with 1 (5 mg) tablet for 28 days, THEN 3 tablets (3 mg) daily with 1 (5 mg) tablet for 28 days, THEN 2 tablets (2 mg) daily with 1 (5 mg) tablet for 28 days, THEN 1 tablet (1 mg) daily with 1 (5 mg) tablet for 28 days, THEN No dosage for 28 days while continuing with 1 (5 mg) tablet, THEN 4 tablets (4 mg total) daily for 28 days, THEN 3  tablets (3 mg total) daily for 28 days, THEN 2 tablets (2 mg total) daily for 28 days, THEN 1 tablet (1 mg total) daily for 28 days, THEN 1 tablet (1 mg total) every other day for 28 days. 720 tablet 9   ??? predniSONE (DELTASONE) 5 MG tablet Take 1 tablet (5 mg total) by mouth daily. Take with 1mg  tablets as directed for taper. 90 tablet 3   ??? safety needles 25 x 5/8  Ndle Use once a week for 4 weeks for b12 injections then once a month after 4 weeks 100 each 3   ??? semaglutide (OZEMPIC) 1 mg/dose (4 mg/3 mL) PnIj injection Inject 1 mg under the skin every seven (7) days. 9 mL 4   ??? syringe with needle (BD LUER-LOK SYRINGE) 3 mL 25 gauge x 1 Syrg Use once a week for 4 weeks for b12 injections then once a month after 4 weeks 100 each 2     No current facility-administered medications for this visit.       Past Medical History:   Diagnosis Date   ??? Cataract    ??? Diabetes mellitus (CMS-HCC)     Type 2    ??? Hyperlipidemia    ??? Hypertension         Record Review: Available records were reviewed, including pertinent office visits, labs, and imaging.      REVIEW OF SYSTEMS: Ten system were reviewed and negative except as noted above.    PHYSICAL EXAM:  VITAL SIGNS:   There were no vitals filed for this visit.  General:   Pleasant 65 y.o.female in no acute distress, WDWN   Eyes:   PERRL, conjunctiva and sclera not inflamed. Tears appear adequate. No swelling or tenderness of temporal arteries.    Cardiovascular:  Regular rate and rhythm. No murmur, rub, or gallop. No lower extremity edema. Lungs:  Clear to auscultation.Normal respiratory effort.    Musculoskeletal:   General: Ambulates w/o assistance.   Hands: No overt swelling. Able to make a tight fist. No TTP.  Wrists: Full range of motion without swelling  Elbows: FROM w/o swelling or tenderness   Shoulders: Pain with rotational range of motion of the left. Minimally decreased abduction b/l. No tenderness over musculature of shoulders.  Neck: No TTP. Good ROM, pain with extension.  Hips: Good ROM. Negative Faber.  Knees: FROM w/o effusions.  Ankles: No swelling or tenderness.    Neurological:  CN 2-12 grossly intact. 5/5 strength on extremities.   Psych:  Appropriate affect and mood   Skin:  No rashes. Wearing a wig.          ASSESSMENT/PLAN:    1. 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  Continue prednisone 7 mg daily.  Continue Otrexup 20 mg subcu weekly, folic acid 1 mg daily. Start Actemra [phone].  - folic acid (FOLVITE) 1 MG tablet; Take 1 tablet (1 mg total) by mouth daily.  Dispense: 90 tablet; Refill: 3  - methotrexate, PF, (OTREXUP, PF,) 20 mg/0.4 mL AtIn; Inject the contents of 1 pen (20mg ) under the skin every 7 days  Dispense: 4.8 mL; Refill: 3  - Tocilizumab (Actemra) 162mg /0.9 mL syrg subcut injection q14 days  - Labs today: CK, aldolase, Vit D    2. Methotrexate, long term, current use  Labs below last checked 11/09/20 to evaluate for medication toxicity.    - CBC w/ Differential  - Albumin; Future  - AST;  Future  - ALT; Future  - CBC w/ Differential; Future  - Creatinine; Future    3.  Osteopenia  Noted on DEXA 11/21/2019, T score L spine +0.5, femur neck -1.6. FRAX score showing 10 yr probability of 13% for of major osteoporotic fracture and a 1.3% for hip fracture.  Thus will not start antiresorptive therapy at this time.  Continue calcium and vitamin D supplementation.  - Vitamin D 25 Hydroxy (25OH D2 + D3)  - Calcium      HCM:   - PCV13 Status: declines   - PPSV 23 Status:declines   - COVID-19 vaccine status:Moderna 08/16/2019, 09/13/2019, 05/19/2020, 08/27/2020, hold methotrexate x1 dose after vaccine today  - Annual Influenza vaccine. Status: 04/25/19  - Bone health: See note on osteopenia above      Greater than 30 minutes spent in visit with patient, including pre and postvisit activities.  Follow up in 2 months with myself via telemedicine and in 3 months at September appointment with Dr Scarlette Calico.

## 2020-11-18 NOTE — Unmapped (Signed)
REASON FOR VISIT: f/u PMR    HISTORY: Madison Harris is a 65 y.o. 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  Initially diagnosed with PMR by outside rheumatologist, severe stiffness in hips and neck. 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 Started SQ mtx (otrexup) in 04/2018. Transferred care to our clinic in 09/2018 due to insurance. Mtx increased to 20 mg at that visit. Repeat RF and CCP were negative. Suspect that underlying diagnosis is PMR.  Treatment history:  - Prednisone taper  - Difficulty tapering prednisone, so mtx (otrexup) added 04/2018  - Transferred care to [facility] rheumatology in 09/2018  - Worsening pain again after tapering to 3 mg, increased back to 7.5 mg in 10/2019  - She then reported increasing pain and transient HA, pain with chewing. Recommended further increase in prednisone, but pt wished to remain on 7.5 mg.   - Hip XR in 10/2019 revealed ?mass over sacrum, but MRI f/u revealed no sacral mass.   - Worsening HAs, though not clearly GCA, increased pred to 10 mg in 11/2019 with plans to start actemra, delayed due to pt traveling out of state.   - Attempted taper from 10 mg pred in 05/2020  - Current medication regimen: mtx sq (otrexup) 20 mg qwk, FA 1 mg qd, prednisone 7 mg qd.     Interim history:   Presents today for f/u.     She has been unable to taper prednisone in the interim.  She reduced prednisone to 6 mg after last visit, thinks she may have been at this dose for a couple of months, then in the last week she returned to 7 mg prednisone due to significant worsening pain.  She could not function on the 6 mg dose.  Increase to 7 mg has helped somewhat with the pain, but has not resolved it.    She denies dizziness, nausea, hypotension.  Blood pressure slightly low today which she attributes to starting a new blood pressure medication yesterday.    She has mostly muscular pain, pain in the bilateral hips and over the buttock area.  Right is worse than the left.  Also pain in lateral hips when laying on her side.  Pain in the shoulders, feet, fingers, distal forearms, neck.  No joint swelling.  Minimal to no morning stiffness.  Pain is worse with activity, but is also worse after having been sitting for 20 to 30 minutes.  Rising from a seated position is difficult due to pain in hips.  Reports left hand weakness.    Continues to have headaches most nights, but this is mild and relieved with Tylenol.  No headaches when she wakes up in the morning.  Denies jaw claudication, vision changes, scalp tenderness.    No fevers, chills, weight loss.  No interim illness or hospitalization.    She has been still living in a 3 wheel trailer and traveling.  Husband currently in [place], and patient is staying in a trailer parked by her children's home.        CURRENT MEDICATIONS:  Current Outpatient Medications   Medication Sig Dispense Refill   ??? aspirin (ECOTRIN) 81 MG tablet Take 81 mg by mouth.     ??? blood sugar diagnostic (CONTOUR NEXT TEST STRIPS) Strp TEST DAILY AS DIRECTED 100 strip 3   ??? blood sugar diagnostic (ACCU-CHEK GUIDE TEST STRIPS) Strp Use to check blood sugar once daily (fasting) 100 each 3   ???  blood-glucose meter kit Check blood sugar daily. 1 each 0   ??? blood-glucose meter Misc Use once daily to check blood sugar as directed by your physician 1 each 0   ??? carvediloL (COREG) 12.5 MG tablet Take 1 tablet (12.5 mg total) by mouth 2 (two) times a day with meals. 180 tablet 3   ??? cyanocobalamin, vitamin B-12, 1,000 mcg/mL injection Inject 1mL once weekly for 4 weeks, THEN inject 1mL once MONTHLY thereafter. 30 mL 2   ??? dapagliflozin (FARXIGA) 10 mg Tab tablet Take 1 tablet (10 mg total) by mouth every morning. 90 tablet 1   ??? ergocalciferol, vitamin D2, (VITAMIN D2 ORAL) Take by mouth.     ??? escitalopram oxalate (LEXAPRO) 20 MG tablet Take 1 tablet (20 mg total) by mouth in the morning. 90 tablet 4   ??? evolocumab (REPATHA SURECLICK) 140 mg/mL PnIj Inject the contents of 1 pen (140mg ) under the skin every 14 days. Appointment needed for future refills. 6 mL 0   ??? folic acid (FOLVITE) 1 MG tablet Take 1 tablet (1 mg total) by mouth daily. 90 tablet 3   ??? insulin glargine U-300 conc (TOUJEO SOLOSTAR U-300 INSULIN) 300 unit/mL (1.5 mL) injection pen Inject 15 Units under the skin nightly. 4.5 mL 3   ??? isosorbide mononitrate (IMDUR) 30 MG 24 hr tablet Take 1 tablet (30 mg total) by mouth daily. 90 tablet 3   ??? lancets Misc Use to check blood sugar once daily (fasting) 100 each 4   ??? lancets Misc Use as directed 200 each 2   ??? methotrexate, PF, (OTREXUP, PF,) 20 mg/0.4 mL AtIn Inject the contents of 1 pen (20mg ) under the skin every 7 days 4.8 mL 3   ??? nitroglycerin (NITROSTAT) 0.4 MG SL tablet PLACE 1 TABLET UNDER THE TONGUE EVERY 5 MINUTES AS NEEDED FOR CHEST PAIN UP TO 3 DOSES THEN CALL 911 IF NO RELIEF     ??? olmesartan-hydrochlorothiazide (BENICAR HCT) 40-12.5 mg per tablet Take 1 tablet by mouth in the morning. 90 tablet 3   ??? pen needle, diabetic 31 gauge x 5/16 (8 mm) Ndle Use 1-3 times daily as directed. 100 each 3   ??? predniSONE (DELTASONE) 1 MG tablet Take 4 tablets (4 mg) by mouth daily with 1 (5 mg) tablet for 28 days, THEN 3 tablets (3 mg) daily with 1 (5 mg) tablet for 28 days, THEN 2 tablets (2 mg) daily with 1 (5 mg) tablet for 28 days, THEN 1 tablet (1 mg) daily with 1 (5 mg) tablet for 28 days, THEN No dosage for 28 days while continuing with 1 (5 mg) tablet, THEN 4 tablets (4 mg total) daily for 28 days, THEN 3 tablets (3 mg total) daily for 28 days, THEN 2 tablets (2 mg total) daily for 28 days, THEN 1 tablet (1 mg total) daily for 28 days, THEN 1 tablet (1 mg total) every other day for 28 days. 720 tablet 9   ??? predniSONE (DELTASONE) 5 MG tablet Take 1 tablet (5 mg total) by mouth daily. Take with 1mg  tablets as directed for taper. 90 tablet 3   ??? safety needles 25 x 5/8  Ndle Use once a week for 4 weeks for b12 injections then once a month after 4 weeks 100 each 3   ??? semaglutide (OZEMPIC) 1 mg/dose (4 mg/3 mL) PnIj injection Inject 1 mg under the skin every seven (7) days. 9 mL 4   ??? syringe with needle (BD LUER-LOK SYRINGE)  3 mL 25 gauge x 1 Syrg Use once a week for 4 weeks for b12 injections then once a month after 4 weeks 100 each 2     No current facility-administered medications for this visit.       Past Medical History:   Diagnosis Date   ??? Cataract    ??? Diabetes mellitus (CMS-HCC)     Type 2    ??? Hyperlipidemia    ??? Hypertension         Record Review: Available records were reviewed, including pertinent office visits, labs, and imaging.      REVIEW OF SYSTEMS: Ten system were reviewed and negative except as noted above.    PHYSICAL EXAM:  VITAL SIGNS:   Vitals:    11/18/20 1106   BP: 97/63   Pulse: 75   Temp: 36.4 ??C (97.5 ??F)   Weight: 73.5 kg (162 lb)   Height: 154.9 cm (5' 1)     General:   Pleasant 65 y.o.female in no acute distress, WDWN   Eyes:   PERRL, conjunctiva and sclera not inflamed. Tears appear adequate. No swelling or tenderness of temporal arteries.    Cardiovascular:  Regular rate and rhythm. No murmur, rub, or gallop. No lower extremity edema.    Lungs:  Clear to auscultation.Normal respiratory effort.    Musculoskeletal:   General: Ambulates w/o assistance.  0/18 tender points positive  Hands: No overt swelling. Able to make a tight fist.  Wrists: Reduced extension of the left  Elbows: FROM w/o swelling or tenderness   Shoulders: Reduced active and passive range of motion bilaterally, mild.  Painful range of motion bilaterally.  Hips: Good range of motion bilaterally.  Negative Faber bilaterally.  Mild tenderness over bilateral trochanteric bursa.  Knees: FROM w/o effusions   Ankles: No swelling or tenderness   Feet: No pain with MTP squeeze   Neurological:  CN 2-12 grossly intact. 5/5 strength on extremities.   Psych:  Appropriate affect and mood   Skin:  No rashes. Wearing a wig.      Swollen Joint Count (0-28): 0  Tender Joint Count (0-28): 2  Patient Global Assessment of Disease Activity (0-10): 4  Evaluator Global Assessment of Disease (0-10): 5  CDAI Score: 11  CDAI interpretation:  0.0-2.8 Remission   2.9-10.0 Low disease activity   10.1-22.0 Moderate disease activity   22.1-76.0 High disease activity         ASSESSMENT/PLAN:    1.  Seronegative RA  Unable to taper below 7 mg prednisone due to worsening pain.  Continue methotrexate (Otrexup) 20 mg weekly, folic acid 1 mg daily.  Add Actemra subcu q. 14 days.  Continue prednisone 7 mg daily for now, will reevaluate in 2 months and attempt taper at that time.  I recommend beginning therapy with Actemra. I discussed the risks of use to include, injection site/infusion reaction, serious infection, hyperlipidemia, bowel perforation, malignancy. Pt is to see urgent medical care for any signs/symptoms of infection to ensure prompt treatment. Pt verbalizes understanding of these risks, and wishes to proceed with therapy.   Hepatitis and tuberculosis screening from 2020 were negative.  - tocilizumab (ACTEMRA) 162 mg/0.9 mL Syrg subcutaneous injection; Inject 0.9 mL (162 mg total) under the skin every fourteen (14) days.  Dispense: 6 mL; Refill: 3    2. Methotrexate, long term, current use  Up-to-date on monitoring labs, reviewed during visit, stable.    3. Myalgia  Suspect PMR, but it is odd that  she is not having much morning stiffness.  Check muscle enzymes below.  - CK  - Aldolase    4.  Osteopenia  Noted on DEXA 11/21/2019, T score L spine +0.5, femur neck -1.6. FRAX score showing 10 yr probability of 13% for of major osteoporotic fracture and a 1.3% for hip fracture.  Thus will not start antiresorptive therapy at this time.  Continue calcium and vitamin D supplementation.  Recheck serum vitamin D.      HCM:   -Pneumonia vaccine: Declines  - COVID-19 vaccine status:Moderna 08/16/2019, 09/13/2019, 05/19/2020, 08/27/2020  - Annual Influenza vaccine. Status: 04/25/19  - Bone health: See note on osteopenia above  -Contraception: Hysterectomy    Greater than 40 minutes spent in visit with patient, including pre and postvisit activities.  Return in 2 months with myself via video to discuss tapering prednisone and in 3 months with Dr Scarlette Calico

## 2020-11-18 NOTE — Unmapped (Signed)
Tocilizumab (actemra) (Information from the [employer] of [employer])    Tocilizumab (Actemra ??) is a biologic medication currently approved to treat adults with moderately to severely active rheumatoid arthritis (RA), adults with giant cell arteritis (GCA), and people ages two and above with Polyarticular Juvenile Idiopathic Arthritis (PJIA) or Systemic Juvenile Idiopathic Arthritis (SJIA). Biologic medications are proteins designed by humans that affect the immune system. Tocilizumab blocks the inflammatory protein IL-6. This improves joint pain and swelling from arthritis and other symptoms caused by inflammation.    How to Take It    For adults with RA or GCA, tocilizumab can be given as either a monthly intravenous infusion or as a subcutaneous injection every one - two weeks. For people with PJIA or SJIA, tocilizumab is given as a monthly intravenous infusion. When used as a subcutaneous injection, the medicine can be injected into the thigh or abdomen. The site of injection should be rotated so the same site is not used multiple times. Some patients will start to see improvement within a few weeks, but it may take several months to take full effect. Tocilizumab may be taken alone or with methotrexate or other non-biologic drugs. Tocilizumab should not be given in combination with another biologic drug. Blood tests will be used to monitor for increases in cholesterol or liver enzymes and for reductions in blood cell counts while taking tocilizumab.    Side Effects    Tocilizumab can lower the ability of your immune system to fight infections. If you develop symptoms of an infection while using this medication, you should stop it and contact your doctor. All patients should be tested for tuberculosis before starting on tocilizumab, although these types of infections have not been frequently seen. Allergic reactions to intravenous tocilizumab infusions can occur, which can include symptoms such as fever and chills, but these are rare. Tocilizumab has been associated with increased cholesterol levels in some patients, and should be periodically monitored. If your cholesterol level becomes too high, it is possible you may need to start taking a medication to lower it. A rare complication seen with tocilizumab use in clinical trials was bowel perforation, or a hole in the bowel wall. If you have a history or diverticulitis or develop abdominal pain or bloody bowel movements while taking tocilizumab, you should notify your doctor immediately.    Tell Your Doctor    You should notify your doctor if you develop symptoms of an infection, such as a fever or cough, or if you think you are having any side effects, especially abdominal pain, bloody bowel movements, or allergic reactions. Notify your doctor if you become pregnant, are planning pregnancy, or if you are breastfeeding. Be sure to talk with your doctor if you are planning to have surgery or if you plan to get any live vaccinations; these include the shingles vaccine, the nasal spray flu vaccine, and others such as the measles, mumps, rubella, and yellow fever vaccines.    Updated July 2017 by Cherylann Parr, MD and reviewed by the [facility] of [facility] Committee on Communications and Marketing.    This information is provided for general education only. Individuals should consult a qualified health care provider for professional medical advice, diagnosis and treatment of a medical or health condition.    ?? 2017 [employer]

## 2020-11-19 DIAGNOSIS — M353 Polymyalgia rheumatica: Principal | ICD-10-CM

## 2020-11-19 DIAGNOSIS — M0600 Rheumatoid arthritis without rheumatoid factor, unspecified site: Principal | ICD-10-CM

## 2020-11-20 ENCOUNTER — Telehealth
Admit: 2020-11-20 | Discharge: 2020-11-21 | Payer: PRIVATE HEALTH INSURANCE | Attending: Internal Medicine | Primary: Internal Medicine

## 2020-11-20 LAB — ALDOLASE: ALDOLASE: 5.8 U/L

## 2020-11-20 LAB — VITAMIN D 25 HYDROXY: VITAMIN D, TOTAL (25OH): 29.8 ng/mL (ref 20.0–80.0)

## 2020-11-20 MED ORDER — OLMESARTAN 40 MG-HYDROCHLOROTHIAZIDE 12.5 MG TABLET
ORAL_TABLET | Freq: Every day | ORAL | 3 refills | 180.00000 days
Start: 2020-11-20 — End: 2021-11-20

## 2020-11-20 NOTE — Unmapped (Signed)
Contact Information     Person Contacted: Patient   Contact Phone number: [phone]   Phone Outcome: Contacted Patient/Caregiver Yes     Madison Harris is 65 y.o.  participating in a real-time audio and video visit encounter.       Reason for Call    Chief Complaint   Patient presents with   ??? Light Headed           Subjective:    i've been sick since I started taking a new medication.  She started olmesartan-hydrochlorothiazide 40/12.5 a few days ago and has had nausea without vomiting,  lightheadedness, diarrhea every since.  She was previously taking lisinopril 40 mg.  She denies rash, hives, throat or mouth swelling.  BP was 104/70 earlier today and 97/63 on 6/1.   It was previously 140's systolic. She is taking semaglutide without difficulty.        I have reviewed the problem list, medications, and allergies and have updated/reconciled them if needed.     Physical Exam:    As part of this visit, no in-person exam was conducted.  If applicable, video visit findings are noted below:      She is alert and fully oriented.        Assessment & Plan:     Encounter Diagnosis   Name Primary?   ??? Essential hypertension Yes        Symptoms may be due to rapid lowering of blood pressure. Coincidental virus is also a consideration.   Halve olmesartan-hydrochlorothiazide.  She will monitor blood pressure and report symptoms next week.     No orders of the defined types were placed in this encounter.         Signs and symptoms that should prompt further evaluation were reviewed with the patient.        The patient reports they are currently: at home. I spent 25 minutes on the real-time audio and video with the patient on the date of service. I spent an additional on pre- and post-visit activities on the date of service.     The patient was physically located in [place] or a state in which I am permitted to provide care. The patient and/or parent/guardian understood that s/he may incur co-pays and cost sharing, and agreed to the telemedicine visit. The visit was reasonable and appropriate under the circumstances given the patient's presentation at the time.    The patient and/or parent/guardian has been advised of the potential risks and limitations of this mode of treatment (including, but not limited to, the absence of in-person examination) and has agreed to be treated using telemedicine. The patient's/patient's family's questions regarding telemedicine have been answered.     If the visit was completed in an ambulatory setting, the patient and/or parent/guardian has also been advised to contact their provider???s office for worsening conditions, and seek emergency medical treatment and/or call 911 if the patient deems either necessary.

## 2020-11-20 NOTE — Unmapped (Signed)
Patient's daughter, Napoleon Form, called to report that her mother recently started a new medication prescribed by Dr. Tye Maryland and has been quite ill ever since.  After obtaining consent from the patient, spoke with her daughter and probed further.  She reported starting the generic equivalent of Benicar three or four days ago.  She became light-headed and nauseous on the first day which later progressed to vomiting and diarrhea.  She also reports myalgias and sweating when passing stool.  Denied fever and chills.  She has vomited twice in the past 24 hours and had two or three episodes of diarrhea within the same time frame.  She has eaten and been able to retain a bagel today as well as plenty of water.  Her blood pressure was 113/76 mmHg this morning but was 104/70 mmHg at 1335 today with a heart rate of 67 beats per minute.  She was unable to locate a pulse oximeter or oral thermometer.  No recent exposure to anyone with similar symptoms.  Mrs. Younes has not taken the medication of concern today and does not want to take it ever again.  Recommended she discuss her symptoms and worries with a provider and offered her a virtual appointment with Dr. Sharlyne Pacas today at 1600.  She accepted.  Appointment scheduled.  Discussed signs and symptoms for which she should call 911 or proceed to the nearest ED.  She verbalized understanding, was amenable, and was appreciative.

## 2020-11-20 NOTE — Unmapped (Signed)
Medications, Allergies, History, and Preferred pharmacy was reviewed with the patient, and corrections were made to their chart accordingly.   Travel Screening completed and patient does not require referral to Covid-19 helpline.   Reason for visit is: dizziness, back pain, nausea, diarrhea.     The Patient was able to check their vital signs, weight, and height at home. They are as follows:    BP: unable to obtain  Pulse:unable to obtain  Temp:unable to obtain  Weight:unable to obtain  Height:unable to obtain  Pain Score:5  Location:back/ neck/ shoulders  Phone [phone]

## 2020-11-25 MED ORDER — OLMESARTAN 40 MG TABLET
ORAL_TABLET | Freq: Every day | ORAL | 4 refills | 90 days | Status: CP
Start: 2020-11-25 — End: 2021-11-25
  Filled 2020-11-30: qty 90, 90d supply, fill #0

## 2020-11-25 NOTE — Unmapped (Signed)
Pt taking olmesartan-hydrochlorothiazide (BENICAR HCT) 40-12.5 mg per tablet and causing dizzyness and a swimmy head. Please call.

## 2020-11-25 NOTE — Unmapped (Signed)
I called and let patient know and she verbalized understanding.

## 2020-11-25 NOTE — Unmapped (Signed)
Addended by: Doroteo Bradford on: 11/25/2020 02:24 PM     Modules accepted: Orders

## 2020-11-25 NOTE — Unmapped (Signed)
Olmesartan 40mg  monotherapy Rx sent in (no hydrochlorothiazide).  Try this for a few weeks to see if it helps.

## 2020-11-25 NOTE — Unmapped (Signed)
I call patient and she reports that she was prescribed Benicar HCT 40-12.5 on 11/12/20.  Since starting this medication she has had diarrhea and nausea, and episodes of neck and shoulder pain.  She goes on to explain that due to these side effects to the medication, her dose was cut in half and she has been taking 1/2 tab daily at night time since 11/20/20 and the diarrhea nausea have improved slightly, however she woke this morning with neck and shoulder pain again (which started when she started this medication).  She would like to know if she could go back on the 40mg  Lisinopril that she was previously on, or if there was another medication she could try instead of the Olmesartan/HCTZ 20-6.25mg  dose she is currently taking?    I ask her to hold her Olmesartan/hctz dose for tonight, I schedule her for follow up appointment with Dr. Tye Maryland on 12/07/20, I ask her to start keeping a daily log of her BP's, and let her know that I will call her back with any further recommendations and/or alternative medications after Dr. Tye Maryland reviews this message and gets back to me.

## 2020-12-02 ENCOUNTER — Encounter: Admit: 2020-12-02 | Discharge: 2020-12-02 | Payer: PRIVATE HEALTH INSURANCE

## 2020-12-02 ENCOUNTER — Ambulatory Visit: Admit: 2020-12-02 | Discharge: 2020-12-02 | Payer: PRIVATE HEALTH INSURANCE

## 2020-12-02 DIAGNOSIS — Z794 Long term (current) use of insulin: Principal | ICD-10-CM

## 2020-12-02 DIAGNOSIS — S30860A Insect bite (nonvenomous) of lower back and pelvis, initial encounter: Principal | ICD-10-CM

## 2020-12-02 DIAGNOSIS — I10 Essential (primary) hypertension: Principal | ICD-10-CM

## 2020-12-02 DIAGNOSIS — M62838 Other muscle spasm: Principal | ICD-10-CM

## 2020-12-02 DIAGNOSIS — M353 Polymyalgia rheumatica: Principal | ICD-10-CM

## 2020-12-02 DIAGNOSIS — E1165 Type 2 diabetes mellitus with hyperglycemia: Principal | ICD-10-CM

## 2020-12-02 DIAGNOSIS — G542 Cervical root disorders, not elsewhere classified: Principal | ICD-10-CM

## 2020-12-02 DIAGNOSIS — W57XXXA Bitten or stung by nonvenomous insect and other nonvenomous arthropods, initial encounter: Principal | ICD-10-CM

## 2020-12-02 LAB — COMPREHENSIVE METABOLIC PANEL
ALBUMIN: 4.2 g/dL (ref 3.4–5.0)
ALKALINE PHOSPHATASE: 57 U/L (ref 46–116)
ALT (SGPT): 21 U/L (ref 10–49)
ANION GAP: 7 mmol/L (ref 5–14)
AST (SGOT): 22 U/L (ref <=34)
BILIRUBIN TOTAL: 0.7 mg/dL (ref 0.3–1.2)
BLOOD UREA NITROGEN: 13 mg/dL (ref 9–23)
BUN / CREAT RATIO: 20
CALCIUM: 9.6 mg/dL (ref 8.7–10.4)
CHLORIDE: 107 mmol/L (ref 98–107)
CO2: 25.9 mmol/L (ref 20.0–31.0)
CREATININE: 0.66 mg/dL
EGFR CKD-EPI (2021) FEMALE: >90 mL/min/{1.73_m2} (ref >=60)
GLUCOSE RANDOM: 115 mg/dL (ref 70–179)
POTASSIUM: 4.2 mmol/L (ref 3.4–4.8)
PROTEIN TOTAL: 6.8 g/dL (ref 5.7–8.2)
SODIUM: 140 mmol/L (ref 135–145)

## 2020-12-02 LAB — HEMOGLOBIN A1C
ESTIMATED AVERAGE GLUCOSE: 146 mg/dL
HEMOGLOBIN A1C: 6.7 % — ABNORMAL HIGH (ref 4.8–5.6)

## 2020-12-02 LAB — CBC W/ AUTO DIFF
BASOPHILS ABSOLUTE COUNT: 0 10*9/L (ref 0.0–0.1)
BASOPHILS RELATIVE PERCENT: 1 %
EOSINOPHILS ABSOLUTE COUNT: 0.1 10*9/L (ref 0.0–0.5)
EOSINOPHILS RELATIVE PERCENT: 1.5 %
HEMATOCRIT: 42.1 % (ref 34.0–44.0)
HEMOGLOBIN: 14.9 g/dL (ref 11.3–14.9)
LYMPHOCYTES ABSOLUTE COUNT: 1.4 10*9/L (ref 1.1–3.6)
LYMPHOCYTES RELATIVE PERCENT: 29.4 %
MEAN CORPUSCULAR HEMOGLOBIN CONC: 35.4 g/dL (ref 32.0–36.0)
MEAN CORPUSCULAR HEMOGLOBIN: 34.7 pg — ABNORMAL HIGH (ref 25.9–32.4)
MEAN CORPUSCULAR VOLUME: 97.9 fL — ABNORMAL HIGH (ref 77.6–95.7)
MEAN PLATELET VOLUME: 8.3 fL (ref 6.8–10.7)
MONOCYTES ABSOLUTE COUNT: 0.3 10*9/L (ref 0.3–0.8)
MONOCYTES RELATIVE PERCENT: 7.1 %
NEUTROPHILS ABSOLUTE COUNT: 3 10*9/L (ref 1.8–7.8)
NEUTROPHILS RELATIVE PERCENT: 61 %
PLATELET COUNT: 216 10*9/L (ref 150–450)
RED BLOOD CELL COUNT: 4.3 10*12/L (ref 3.95–5.13)
RED CELL DISTRIBUTION WIDTH: 12.2 % (ref 12.2–15.2)
WBC ADJUSTED: 4.8 10*9/L (ref 3.6–11.2)

## 2020-12-02 LAB — SEDIMENTATION RATE: ERYTHROCYTE SEDIMENTATION RATE: 6 mm/h (ref 0–30)

## 2020-12-02 LAB — C-REACTIVE PROTEIN: C-REACTIVE PROTEIN: <4 mg/L (ref <=10.0)

## 2020-12-02 MED ORDER — CYCLOBENZAPRINE 5 MG TABLET
ORAL_TABLET | Freq: Three times a day (TID) | ORAL | 0 refills | 20.00000 days | Status: CP | PRN
Start: 2020-12-02 — End: ?

## 2020-12-02 MED ORDER — GABAPENTIN 100 MG CAPSULE
ORAL_CAPSULE | Freq: Every evening | ORAL | 1 refills | 0 days | Status: CP | PRN
Start: 2020-12-02 — End: 2021-12-02

## 2020-12-02 NOTE — Unmapped (Signed)
Patient Name: Madison Harris  PCP: ??Doroteo Bradford, MD  Source of History: patient and records  Date of Visit: 12/02/20 12:12 PM    Chief Compliant: Follow-up for PMR vs RA    PRIOR RHEUMATOLOGIC HISTORY:??hx of ?seronegative RA vs PMR. ??Initially diagnosed with PMR by outside rheumatologist, severe stiffness in hips and neck. 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 Started SQ mtx (otrexup) in 04/2018. Transferred care to our clinic in 09/2018 due to insurance. Mtx increased to 20 mg at that visit. Repeat RF and CCP were negative. Suspect that underlying diagnosis is PMR.  Treatment history:  - Prednisone taper  - Difficulty tapering prednisone, so mtx (otrexup) added 04/2018  - Transferred care to [facility] rheumatology in 09/2018  - Worsening pain again after tapering to 3 mg, increased back to 7.5 mg in 10/2019  - She then reported increasing pain and transient HA, pain with chewing. Recommended further increase in prednisone, but pt wished to remain on 7.5 mg. Did increase to 10 mg in June 2021.   - Current medication regimen: mtx sq (otrexup) 20 mg qwk, FA 1 mg qd, prednisone 7 mg qd.     HPI: Madison Harris is a 65 y.o. female with T2DM, OSA, and CAD s/p PCI who is being followed at [facility] for PMR vs RA. Last evaluated by me virtually in December 2021 as she did not feel well to present in person. More recently saw Carlus Pavlov [facility] two weeks ago with plans to add tocilizumab due to difficulty tapering prednisone below 10 mg daily. Hemoglobin A1c has been negatively impacted by use of prednisone.Patient requested appointment today for in person evaluation due to all over pain.    Today, patient reports severe pain involving her right shoulder with radiation down her right arm. Right shoulder pain started five days ago. She had two trigger point injections at an urgent care around the right shoulder blade area with no improvement. She also reports numbness and paresthesias in her fingers. She denies any trauma. She had a tick bite a three weeks ago and she is not sure how long it is was there. No rashes. No fevers. She recently had blood pressure medication addition with olmesartan and it caused her to have vomiting, diarrhea and aching. She has stopped olmesartan. Her blood pressure is elevated but she has not been on any hypertensives due to the side effects of the olmesartan and she is in pain. In addition, she has headache and her jaws have been sore. She denies any left shoulder pain. She does have right hip pain and has difficulty crossing it but not on the left. She notes that if she applies pressure on the right shoulder region that this helps. She was given a muscle relaxant, baclofen 5 mg tid along with the trigger point injection has helped. She has tried Aleve, Advil, ES Tylenol without relief.  Headaches come and go and not currently present.      ROS??: Attests to the above, otherwise, review of all other systems is negative.     Past Medical and Surgical History:  ??  Patient Active Problem List    Diagnosis Date Noted   ??? B12 deficiency 04/25/2019   ??? PMR (polymyalgia rheumatica) (CMS-HCC) 01/23/2019   ??? Current chronic use of systemic steroids 01/23/2019   ??? OSA (obstructive sleep apnea) 01/23/2019   ??? T2DM, uncontrolled, on insulin 07/25/2018   ??? CAD s/p  PCI 07/25/2018   ??? Essential hypertension 07/25/2018   ??? Mixed hyperlipidemia 07/25/2018     Past Surgical History:   Procedure Laterality Date   ??? CARDIAC SURGERY      2 stents.    ??? HYSTERECTOMY     ??? PR COLONOSCOPY FLX DX W/COLLJ SPEC WHEN PFRMD N/A 09/10/2020    Procedure: COLONOSCOPY, FLEXIBLE, PROXIMAL TO SPLENIC FLEXURE; DIAGNOSTIC, W/WO COLLECTION SPECIMEN BY BRUSH OR WASH;  Surgeon: Pasty Arch, MD;  Location: [facility];  Service: Gastroenterology     Allergies:   ??  Allergies   Allergen Reactions   ??? Pravastatin Muscle Pain     Other Reaction: myalgia Current Outpatient Medications:  ??  Current Outpatient Medications on File Prior to Visit   Medication Sig Dispense Refill   ??? aspirin (ECOTRIN) 81 MG tablet Take 81 mg by mouth.     ??? blood sugar diagnostic (ACCU-CHEK GUIDE TEST STRIPS) Strp Use to check blood sugar once daily (fasting) 100 each 3   ??? blood sugar diagnostic (CONTOUR NEXT TEST STRIPS) Strp TEST DAILY AS DIRECTED 100 strip 3   ??? blood-glucose meter Misc Use once daily to check blood sugar as directed by your physician 1 each 0   ??? cyanocobalamin, vitamin B-12, 1,000 mcg/mL injection Inject 1mL once weekly for 4 weeks, THEN inject 1mL once MONTHLY thereafter. 30 mL 2   ??? dapagliflozin (FARXIGA) 10 mg Tab tablet Take 1 tablet (10 mg total) by mouth every morning. 90 tablet 1   ??? ergocalciferol, vitamin D2, (VITAMIN D2 ORAL) Take by mouth.     ??? escitalopram oxalate (LEXAPRO) 20 MG tablet Take 1 tablet (20 mg total) by mouth in the morning. 90 tablet 4   ??? evolocumab (REPATHA SURECLICK) 140 mg/mL PnIj Inject the contents of 1 pen (140mg ) under the skin every 14 days. Appointment needed for future refills. 6 mL 0   ??? folic acid (FOLVITE) 1 MG tablet Take 1 tablet (1 mg total) by mouth daily. 90 tablet 3   ??? insulin glargine U-300 conc (TOUJEO SOLOSTAR U-300 INSULIN) 300 unit/mL (1.5 mL) injection pen Inject 15 Units under the skin nightly. (Patient taking differently: Inject 12 Units under the skin nightly.) 4.5 mL 3   ??? isosorbide mononitrate (IMDUR) 30 MG 24 hr tablet Take 1 tablet (30 mg total) by mouth daily. 90 tablet 3   ??? lancets Misc Use as directed 200 each 2   ??? lancets Misc Use to check blood sugar once daily (fasting) 100 each 4   ??? methotrexate, PF, (OTREXUP, PF,) 20 mg/0.4 mL AtIn Inject the contents of 1 pen (20mg ) under the skin every 7 days 4.8 mL 3   ??? nitroglycerin (NITROSTAT) 0.4 MG SL tablet PLACE 1 TABLET UNDER THE TONGUE EVERY 5 MINUTES AS NEEDED FOR CHEST PAIN UP TO 3 DOSES THEN CALL 911 IF NO RELIEF     ??? pen needle, diabetic 31 gauge x 5/16 (8 mm) Ndle Use 1-3 times daily as directed. 100 each 3   ??? predniSONE (DELTASONE) 1 MG tablet Take 4 tablets (4 mg) by mouth daily with 1 (5 mg) tablet for 28 days, THEN 3 tablets (3 mg) daily with 1 (5 mg) tablet for 28 days, THEN 2 tablets (2 mg) daily with 1 (5 mg) tablet for 28 days, THEN 1 tablet (1 mg) daily with 1 (5 mg) tablet for 28 days, THEN No dosage for 28 days while continuing with 1 (5 mg) tablet,  THEN 4 tablets (4 mg total) daily for 28 days, THEN 3 tablets (3 mg total) daily for 28 days, THEN 2 tablets (2 mg total) daily for 28 days, THEN 1 tablet (1 mg total) daily for 28 days, THEN 1 tablet (1 mg total) every other day for 28 days. 720 tablet 9   ??? predniSONE (DELTASONE) 5 MG tablet Take 1 tablet (5 mg total) by mouth daily. Take with 1mg  tablets as directed for taper. 90 tablet 3   ??? safety needles 25 x 5/8  Ndle Use once a week for 4 weeks for b12 injections then once a month after 4 weeks 100 each 3   ??? semaglutide (OZEMPIC) 1 mg/dose (4 mg/3 mL) PnIj injection Inject 1 mg under the skin every seven (7) days. 9 mL 4   ??? syringe with needle (BD LUER-LOK SYRINGE) 3 mL 25 gauge x 1 Syrg Use once a week for 4 weeks for b12 injections then once a month after 4 weeks 100 each 2   ??? tocilizumab (ACTEMRA) 162 mg/0.9 mL Syrg subcutaneous injection Inject the contents of 1 syringe (162 mg total) under the skin every fourteen (14) days. 6 mL 3   ??? blood-glucose meter kit Check blood sugar daily. 1 each 0   ??? carvediloL (COREG) 12.5 MG tablet Take 1 tablet (12.5 mg total) by mouth 2 (two) times a day with meals. 180 tablet 3   ??? olmesartan (BENICAR) 40 MG tablet Take 1 tablet (40 mg total) by mouth in the morning. (Patient not taking: Reported on 12/02/2020) 90 tablet 4     No current facility-administered medications on file prior to visit.     ??  Immunization History   Administered Date(s) Administered   ??? COVID-19 VACCINE,MRNA(MODERNA)(PF)(IM) 08/16/2019, 09/13/2019, 05/19/2020, 08/27/2020   ??? INFLUENZA INJ MDCK PF, QUAD,(FLUCELVAX)(61MO AND UP EGG FREE) 04/06/2020   ??? INFLUENZA TIV (TRI) PF (IM) 04/26/2013   ??? Influenza Vaccine Quad (IIV4 PF) 76mo+ injectable 04/26/2013, 04/25/2014, 04/22/2015, 07/25/2018, 04/25/2019   ??? Influenza Virus Vaccine, unspecified formulation 04/25/2014, 04/22/2015     PHYSICAL EXAM?? -   Vital signs: BP 157/100 (BP Site: L Arm, BP Position: Sitting, BP Cuff Size: Medium)  - Pulse 67  - Temp 36.6 ??C (97.8 ??F) (Temporal)  - Wt 72.8 kg (160 lb 6.4 oz)  - BMI 30.31 kg/m?? Body mass index is 30.31 kg/m??.  Gen: Well-developed, well-nourished adult in no apparent distress. AOx4.?  HEENT: Face symmetric????, PERRL, No temporal tenderness, Temporal Artery pulses full. NO TMJ tenderness or clicking.   CV: RRR, nl S1, S2, no r/m/g  Lungs: CTA bilaterally  Neuro: Good comprehension/cognition. CN 2-12 intact.  Strength 5/5. Sensation to light touch intact  Comprehensive Musculoskeletal Examination:????  ?? Jaw, neck without limited ROM.???? Notes discomfort around right trapezius with neck ROM  ?? Shoulders, elbows, wrists, hands, fingers:  No deformity, erythema, swelling, or limited ROM.?? No pain with ROM of shoulder with passive or active ROM. No tenderness at shoulders, elbows, wrists or hands. Tenderness over scapula and trapezius region  ?? Hips with full ROM. Notes pain over lateral trochanter of right with ROM and less so with palpation. Negative log rolling bilaterally  ?? Knees, ankles, feet WNL  Skin: No rashes    LABORATORY - reviewed prior  Recent Results (from the past 504 hour(s))   CK    Collection Time: 11/18/20 12:19 PM   Result Value Ref Range    Creatine Kinase, Total 36.0 34.0 - 145.0 U/L  25 OH Vit D    Collection Time: 11/18/20 12:19 PM   Result Value Ref Range    Vitamin D Total (25OH) 29.8 20.0 - 80.0 ng/mL   Aldolase    Collection Time: 11/18/20 12:20 PM   Result Value Ref Range    Aldolase 5.8 <7.7 U/L   CRP  C-Reactive Protein    Collection Time: 12/02/20  1:12 PM   Result Value Ref Range    CRP <4.0 <=10.0 mg/L   ESR Sed rate    Collection Time: 12/02/20  1:12 PM   Result Value Ref Range    Sed Rate 6 0 - 30 mm/h   Hemoglobin A1c    Collection Time: 12/02/20  1:12 PM   Result Value Ref Range    Hemoglobin A1C 6.7 (H) 4.8 - 5.6 %    Estimated Average Glucose 146 mg/dL   Comprehensive metabolic panel    Collection Time: 12/02/20  1:12 PM   Result Value Ref Range    Sodium 140 135 - 145 mmol/L    Potassium 4.2 3.4 - 4.8 mmol/L    Chloride 107 98 - 107 mmol/L    CO2 25.9 20.0 - 31.0 mmol/L    Anion Gap 7 5 - 14 mmol/L    BUN 13 9 - 23 mg/dL    Creatinine 1.61 0.96 - 0.80 mg/dL    BUN/Creatinine Ratio 20     eGFR CKD-EPI (2021) Female >90 >=60 mL/min/1.15m2    Glucose 115 70 - 179 mg/dL    Calcium 9.6 8.7 - 04.5 mg/dL    Albumin 4.2 3.4 - 5.0 g/dL    Total Protein 6.8 5.7 - 8.2 g/dL    Total Bilirubin 0.7 0.3 - 1.2 mg/dL    AST 22 <=40 U/L    ALT 21 10 - 49 U/L    Alkaline Phosphatase 57 46 - 116 U/L   CBC w/ Differential    Collection Time: 12/02/20  1:12 PM   Result Value Ref Range    WBC 4.8 3.6 - 11.2 10*9/L    RBC 4.30 3.95 - 5.13 10*12/L    HGB 14.9 11.3 - 14.9 g/dL    HCT 98.1 19.1 - 47.8 %    MCV 97.9 (H) 77.6 - 95.7 fL    MCH 34.7 (H) 25.9 - 32.4 pg    MCHC 35.4 32.0 - 36.0 g/dL    RDW 29.5 62.1 - 30.8 %    MPV 8.3 6.8 - 10.7 fL    Platelet 216 150 - 450 10*9/L    Neutrophils % 61.0 %    Lymphocytes % 29.4 %    Monocytes % 7.1 %    Eosinophils % 1.5 %    Basophils % 1.0 %    Absolute Neutrophils 3.0 1.8 - 7.8 10*9/L    Absolute Lymphocytes 1.4 1.1 - 3.6 10*9/L    Absolute Monocytes 0.3 0.3 - 0.8 10*9/L    Absolute Eosinophils 0.1 0.0 - 0.5 10*9/L    Absolute Basophils 0.0 0.0 - 0.1 10*9/L       ????IMAGING???? - reviewed prior    ????GENERAL SUMMARY AND IMPRESSION: ????  ????  In summary, the patient is a 65 y.o. female with PMR vs RA on prednisone 7 mg daily and methotrexate 20 mg once a week. She was recently seen a couple of weeks ago and due to difficulty tapering prednisone, plan was made to add tocilizumab which is undergoing prior authorization approval process. Today, she reports developing right  shoulder pain five days. Pain localizing more to the trapezius and scapular region rather than the shoulder joint itself. Also has pain radiating down right arm with paresthesias. In association, has had headache as well as more diffuse pain all over non specifically. Exam does note right hip pain with ROM that localizes laterally. Differential for presentation is a mechanical stress to trapezius and scapularis region, cervical impingement, PMR/GCA, and inflammatory arthritis. Overall, lower suspicion for PMR/GCA and inflammatory arthritis contributing to current presentation. Will trial muscle relaxant cyclobenzaprine (flexeril) and gabapentin 100 mg at bedtime. Referral to physical therapy made as well. Will check inflammatory markers for question of PMR/GCA. Given recent tick bite will check tickborne illness panel including Lyme and checking CBC w/diff, Cr, AST and ALT which might be affected by infectious process. Return in about 2 months (around 02/09/2021).    RECOMMENDATIONS: ????  ????   Diagnosis ICD-10-CM Associated Orders   1. PMR (polymyalgia rheumatica) (CMS-HCC)  M35.3 CRP  C-Reactive Protein     ESR Sed rate     CBC w/ Differential     Ambulatory referral to Physical Therapy   2. Tick bite of lower back, initial encounter  S30.860A Lyme Disease Serology    W57.XXXA RMSF Antibody     Ehrlichia Antibody Panel     CBC w/ Differential   3. Muscle spasm  M62.838 cyclobenzaprine (FLEXERIL) 5 MG tablet     gabapentin (NEURONTIN) 100 MG capsule     Ambulatory referral to Physical Therapy   4. Cervical nerve root impingement  G54.2 cyclobenzaprine (FLEXERIL) 5 MG tablet     gabapentin (NEURONTIN) 100 MG capsule     Ambulatory referral to Physical Therapy     Patient Instructions   Suspect more likely a muscle spasms vs cervical radiculopathy vs PMR/GCA vs other. Will check labs including tickborne illnesses today    Trial of Flexeril and Neurontin. See literature.    Physical therapy ordered.    The patient indicates understanding of these issues and agrees to the plan as outlined above.  Contact information provided for any concerns or questions in the interim.  ??  I personally spent 35 minutes face-to-face and non-face-to-face in the care of this patient, which includes all pre, intra, and post visit time on the date of service.    Rondo Spittler C. Scarlette Calico, MD, PhD  Assistant Professor of Medicine  Department of Medicine/Division of Rheumatology  [facility]  4:28 PM

## 2020-12-02 NOTE — Unmapped (Signed)
Suspect more likely a muscle spasms vs cervical radiculopathy vs PMR/GCA vs other.     Will check labs including tickborne illnesses today    Trial of Flexeril and Neurontin. See literature.    Physical therapy ordered.

## 2020-12-03 LAB — LYME DISEASE SEROLOGY: LYME ANTIBODY: NEGATIVE

## 2020-12-04 LAB — EHRLICHIA ANTIBODY: EHRLICHIA IGG IFA REFLEX: <1:64 {titer} (ref <1:64)

## 2020-12-09 LAB — RICKETTSIA (RMSF) ANTIBODY

## 2020-12-10 DIAGNOSIS — Z794 Long term (current) use of insulin: Principal | ICD-10-CM

## 2020-12-10 DIAGNOSIS — E1165 Type 2 diabetes mellitus with hyperglycemia: Principal | ICD-10-CM

## 2020-12-10 MED ORDER — DAPAGLIFLOZIN 10 MG TABLET
ORAL_TABLET | Freq: Every morning | ORAL | 3 refills | 90 days | Status: CP
Start: 2020-12-10 — End: ?
  Filled 2020-12-15: qty 90, 90d supply, fill #0

## 2020-12-10 NOTE — Unmapped (Signed)
scheduled to ship 6/28- thanks    Patient is requesting the following refill  Requested Prescriptions     Pending Prescriptions Disp Refills   ??? dapagliflozin (FARXIGA) 10 mg Tab tablet 90 tablet 3     Sig: Take 1 tablet (10 mg total) by mouth every morning.       Recent Visits  Date Type Provider Dept   11/20/20 Telemedicine Virgina Evener, MD [facility] Internal Medicine   11/09/20 Office Visit Livingston Diones, MD [facility] Internal Medicine   08/10/20 Office Visit Livingston Diones, MD [facility]   04/06/20 Office Visit Livingston Diones, MD [facility]   Showing recent visits within past 365 days with a meds authorizing provider and meeting all other requirements  Future Appointments  Date Type Provider Dept   12/23/20 Appointment Livingston Diones, MD [facility] Internal Medicine   02/10/21 Appointment Livingston Diones, MD [facility] Internal Medicine   Showing future appointments within next 365 days with a meds authorizing provider and meeting all other requirements       Labs:   A1c:   Hemoglobin A1C (%)   Date Value   12/02/2020 6.7 (H)   01/23/2019 9.0 (H)

## 2020-12-15 MED FILL — CARVEDILOL 12.5 MG TABLET: ORAL | 90 days supply | Qty: 180 | Fill #1

## 2020-12-31 DIAGNOSIS — S30860A Insect bite (nonvenomous) of lower back and pelvis, initial encounter: Principal | ICD-10-CM

## 2020-12-31 DIAGNOSIS — W57XXXA Bitten or stung by nonvenomous insect and other nonvenomous arthropods, initial encounter: Principal | ICD-10-CM

## 2020-12-31 NOTE — Unmapped (Signed)
Called to discuss results of RMSF titer that is positive. Lab recommendations are for pt to have titer rechecked 2-3 wks after first titer and if 4-fold higher this is indication of recent or recurrent infection.   Overall feels well. No fevers, chills. Has a red rash on b/l legs, red dots on legs, don't itch. She states it is not even really bad enough that she would call it a rash. Overall does not sound consistent with acute RMSF.  Order placed for repeat RMSF titer for pt to complete in the next couple of days. She verbalizes understanding and agreement with plan.

## 2021-01-01 ENCOUNTER — Encounter: Admit: 2021-01-01 | Discharge: 2021-01-02 | Payer: PRIVATE HEALTH INSURANCE

## 2021-01-12 DIAGNOSIS — I2510 Atherosclerotic heart disease of native coronary artery without angina pectoris: Principal | ICD-10-CM

## 2021-01-12 MED ORDER — REPATHA SURECLICK 140 MG/ML SUBCUTANEOUS PEN INJECTOR
SUBCUTANEOUS | 0 refills | 84 days
Start: 2021-01-12 — End: ?

## 2021-01-12 NOTE — Unmapped (Signed)
[facility] Clinical Assessment & Refill Coordination Note    Madison Harris, Madison Harris: 12-23-1955  Phone: [phone] (home)     All above HIPAA information was verified with patient.     Was a Nurse, learning disability used for this call? No    Specialty Medication(s):   Inflammatory Disorders: Otrexup and General Specialty: Repatha     Current Outpatient Medications   Medication Sig Dispense Refill   ??? aspirin (ECOTRIN) 81 MG tablet Take 81 mg by mouth.     ??? blood sugar diagnostic (ACCU-CHEK GUIDE TEST STRIPS) Strp Use to check blood sugar once daily (fasting) 100 each 3   ??? blood sugar diagnostic (CONTOUR NEXT TEST STRIPS) Strp TEST DAILY AS DIRECTED 100 strip 3   ??? blood-glucose meter kit Check blood sugar daily. 1 each 0   ??? blood-glucose meter Misc Use once daily to check blood sugar as directed by your physician 1 each 0   ??? carvediloL (COREG) 12.5 MG tablet Take 1 tablet (12.5 mg total) by mouth 2 (two) times a day with meals. 180 tablet 3   ??? cyanocobalamin, vitamin B-12, 1,000 mcg/mL injection Inject 1mL once weekly for 4 weeks, THEN inject 1mL once MONTHLY thereafter. 30 mL 2   ??? cyclobenzaprine (FLEXERIL) 5 MG tablet Take 1 tablet (5 mg total) by mouth Three (3) times a day as needed for muscle spasms. 60 tablet 0   ??? dapagliflozin (FARXIGA) 10 mg Tab tablet Take 1 tablet (10 mg total) by mouth every morning. 90 tablet 3   ??? ergocalciferol, vitamin D2, (VITAMIN D2 ORAL) Take by mouth.     ??? escitalopram oxalate (LEXAPRO) 20 MG tablet Take 1 tablet (20 mg total) by mouth in the morning. 90 tablet 4   ??? evolocumab (REPATHA SURECLICK) 140 mg/mL PnIj Inject the contents of 1 pen (140mg ) under the skin every 14 days. Appointment needed for future refills. 6 mL 0   ??? folic acid (FOLVITE) 1 MG tablet Take 1 tablet (1 mg total) by mouth daily. 90 tablet 3   ??? gabapentin (NEURONTIN) 100 MG capsule Take 1 capsule (100 mg total) by mouth at bedtime as needed. 30 capsule 1   ??? insulin glargine U-300 conc (TOUJEO SOLOSTAR U-300 INSULIN) 300 unit/mL (1.5 mL) injection pen Inject 15 Units under the skin nightly. (Patient taking differently: Inject 12 Units under the skin nightly.) 4.5 mL 3   ??? isosorbide mononitrate (IMDUR) 30 MG 24 hr tablet Take 1 tablet (30 mg total) by mouth daily. 90 tablet 3   ??? lancets Misc Use as directed 200 each 2   ??? lancets Misc Use to check blood sugar once daily (fasting) 100 each 4   ??? methotrexate, PF, (OTREXUP, PF,) 20 mg/0.4 mL AtIn Inject the contents of 1 pen (20mg ) under the skin every 7 days 4.8 mL 3   ??? nitroglycerin (NITROSTAT) 0.4 MG SL tablet PLACE 1 TABLET UNDER THE TONGUE EVERY 5 MINUTES AS NEEDED FOR CHEST PAIN UP TO 3 DOSES THEN CALL 911 IF NO RELIEF     ??? olmesartan (BENICAR) 40 MG tablet Take 1 tablet (40 mg total) by mouth in the morning. (Patient not taking: Reported on 12/02/2020) 90 tablet 4   ??? pen needle, diabetic 31 gauge x 5/16 (8 mm) Ndle Use 1-3 times daily as directed. 100 each 3   ??? predniSONE (DELTASONE) 1 MG tablet Take 4 tablets (4 mg) by mouth daily with 1 (5 mg) tablet for 28 days,  THEN 3 tablets (3 mg) daily with 1 (5 mg) tablet for 28 days, THEN 2 tablets (2 mg) daily with 1 (5 mg) tablet for 28 days, THEN 1 tablet (1 mg) daily with 1 (5 mg) tablet for 28 days, THEN No dosage for 28 days while continuing with 1 (5 mg) tablet, THEN 4 tablets (4 mg total) daily for 28 days, THEN 3 tablets (3 mg total) daily for 28 days, THEN 2 tablets (2 mg total) daily for 28 days, THEN 1 tablet (1 mg total) daily for 28 days, THEN 1 tablet (1 mg total) every other day for 28 days. 720 tablet 9   ??? predniSONE (DELTASONE) 5 MG tablet Take 1 tablet (5 mg total) by mouth daily. Take with 1mg  tablets as directed for taper. 90 tablet 3   ??? safety needles 25 x 5/8  Ndle Use once a week for 4 weeks for b12 injections then once a month after 4 weeks 100 each 3   ??? semaglutide (OZEMPIC) 1 mg/dose (4 mg/3 mL) PnIj injection Inject 1 mg under the skin every seven (7) days. 9 mL 4   ??? syringe with needle (BD LUER-LOK SYRINGE) 3 mL 25 gauge x 1 Syrg Use once a week for 4 weeks for b12 injections then once a month after 4 weeks 100 each 2   ??? tocilizumab (ACTEMRA) 162 mg/0.9 mL Syrg subcutaneous injection Inject the contents of 1 syringe (162 mg total) under the skin every fourteen (14) days. 6 mL 3     No current facility-administered medications for this visit.        Changes to medications: Madison Harris reports no changes at this time.    Allergies   Allergen Reactions   ??? Pravastatin Muscle Pain     Other Reaction: myalgia       Changes to allergies: No    SPECIALTY MEDICATION ADHERENCE     Otrexeup 20mg /0.28mL : 13 days of medicine on hand   Repatha 140 mg/ml: 27 days of medicine on hand       Medication Adherence    Patient reported X missed doses in the last month: 0  Specialty Medication: Otrexup 20mg /0.64mL  Patient is on additional specialty medications: Yes  Additional Specialty Medications: Repatha 140 mg/mL  Patient Reported Additional Medication X Missed Doses in the Last Month: 0  Informant: patient          Specialty medication(s) dose(s) confirmed: Regimen is correct and unchanged.     Are there any concerns with adherence? No    Adherence counseling provided? Not needed    CLINICAL MANAGEMENT AND INTERVENTION      Clinical Benefit Assessment:    Do you feel the medicine is effective or helping your condition? Yes    Clinical Benefit counseling provided? Not needed    Adverse Effects Assessment:    Are you experiencing any side effects? No    Are you experiencing difficulty administering your medicine? No    Quality of Life Assessment:    Quality of Life    Rheumatology  On a scale of 1 - 10 with 1 representing not at all and 10 representing completely - how has your rheumatologic condition affected your:  Daily pain level?: 1  Ability to complete your regular daily tasks (prepare meals, get dressed, etc.)?: 1  Ability to participate in social or family activities?: 1  Oncology  Dermatology          How many days over the past month  did your CAD  keep you from your normal activities? For example, brushing your teeth or getting up in the morning. Patient declined to answer    Have you discussed this with your provider? Not needed    Acute Infection Status:    Acute infections noted within Epic:  No active infections  Patient reported infection: None    Therapy Appropriateness:    Is therapy appropriate? Yes, therapy is appropriate and should be continued    DISEASE/MEDICATION-SPECIFIC INFORMATION      For patients on injectable medications:   Patient currently has 1 Otrexup dose left.  Next injection is scheduled for 01/18/21.  Patient currently has 1 Repatha dose left.  Next injection is scheduled for 01/25/21.    PATIENT SPECIFIC NEEDS     - Does the patient have any physical, cognitive, or cultural barriers? No    - Is the patient high risk? No    - Does the patient require a Care Management Plan? No     - Does the patient require physician intervention or other additional services (i.e. nutrition, smoking cessation, social work)? No      SHIPPING     Specialty Medication(s) to be Shipped:   Inflammatory Disorders: Otrexup and General Specialty: Repatha    Other medication(s) to be shipped: Ozempic, Prednisone 1mg  and 5mg , Folic acid, Isosorbide     Changes to insurance: No    Delivery Scheduled: Yes, Expected medication delivery date: 02/14/21.  However, Rx request for Repatha refills was sent to the provider as there are none remaining.     Medication will be delivered via UPS to the confirmed prescription address [facility].    The patient will receive a drug information handout for each medication shipped and additional FDA Medication Guides as required.  Verified that patient has previously received a Conservation officer, historic buildings and a Surveyor, mining.    The patient or caregiver noted above participated in the development of this care plan and knows that they can request review of or adjustments to the care plan at any time.      All of the patient's questions and concerns have been addressed.    Camillo Flaming   [facility] Specialty Pharmacist

## 2021-01-13 ENCOUNTER — Encounter
Admit: 2021-01-13 | Discharge: 2021-01-14 | Payer: PRIVATE HEALTH INSURANCE | Attending: Internal Medicine | Primary: Internal Medicine

## 2021-01-13 DIAGNOSIS — I10 Essential (primary) hypertension: Principal | ICD-10-CM

## 2021-01-13 DIAGNOSIS — I2510 Atherosclerotic heart disease of native coronary artery without angina pectoris: Principal | ICD-10-CM

## 2021-01-13 MED ORDER — REPATHA SURECLICK 140 MG/ML SUBCUTANEOUS PEN INJECTOR
SUBCUTANEOUS | 0 refills | 84.00000 days | Status: CP
Start: 2021-01-13 — End: ?
  Filled 2021-01-13: qty 6, 84d supply, fill #0

## 2021-01-13 MED ORDER — AMLODIPINE 2.5 MG TABLET
ORAL_TABLET | Freq: Every day | ORAL | 1 refills | 90.00000 days | Status: CP
Start: 2021-01-13 — End: 2021-07-12

## 2021-01-13 MED FILL — PREDNISONE 1 MG TABLET: ORAL | 84 days supply | Qty: 140 | Fill #1

## 2021-01-13 MED FILL — ISOSORBIDE MONONITRATE ER 30 MG TABLET,EXTENDED RELEASE 24 HR: ORAL | 90 days supply | Qty: 90 | Fill #1

## 2021-01-13 MED FILL — FOLIC ACID 1 MG TABLET: ORAL | 90 days supply | Qty: 90 | Fill #1

## 2021-01-13 MED FILL — OTREXUP (PF) 20 MG/0.4 ML SUBCUTANEOUS AUTO-INJECTOR: SUBCUTANEOUS | 84 days supply | Qty: 4.8 | Fill #1

## 2021-01-13 MED FILL — PREDNISONE 5 MG TABLET: ORAL | 84 days supply | Qty: 84 | Fill #2

## 2021-01-13 MED FILL — OZEMPIC 1 MG/DOSE (4 MG/3 ML) SUBCUTANEOUS PEN INJECTOR: SUBCUTANEOUS | 84 days supply | Qty: 9 | Fill #1

## 2021-01-13 NOTE — Unmapped (Signed)
Madison Harris 's Repatha shipment will be sent out  as a result of a new prescription for the medication has been received.      I have reached out to the patient  at [phone] and left a voicemail message.  We will reschedule the medication for the delivery date that the patient agreed upon.  We have confirmed the delivery date as 7/28, via ups.

## 2021-01-13 NOTE — Unmapped (Signed)
[facility]  [address] [place], [place] [address]  Ph: [phone], F: [phone]            ASSESSMENT & PLAN     Essential hypertension  Bps still not at goal on home monitoring  Did not tolerate hydrochlorothiazide well, so add amlodipine  Follow up 3 months  -     amLODIPine (NORVASC) 2.5 MG tablet; Take 1 tablet (2.5 mg total) by mouth in the morning.      Return in about 3 months (around 04/15/2021) for hypertension with Dr Tye Maryland.    HPI     Chief Complaint   Patient presents with   ??? BP and medication check     Madison Harris is a 65 y.o. female patient of Dr. Tye Maryland with hypertension type 2 diabetes and PMR here for follow-up of her blood pressure.  At last visit and the lisinopril was changed to losartan hydrochlorothiazide due to uncontrolled blood pressures. Then changed to losartan monotherapy after she was having muscle cramping with the hydrochlorothiazide part - does not know what Bps were then. Last month periodic monitoring 130-160/ 80-100. Feels well. Going on 2.5 month trip to [place] to visit friends.     ROS  Constitutional: Denies fever, chills, fatigue  CV: Denies chest pain or palpitations, lower extremity edema  RESP:  Denies shortness of breath, wheezing, cough  GI: Denies nausea vomiting, diarrhea, pain    I have reviewed and updated the past medical, social, and family history today and updated them in the EMR.    Current Outpatient Medications   Medication Sig Dispense Refill   ??? aspirin (ECOTRIN) 81 MG tablet Take 81 mg by mouth.     ??? blood sugar diagnostic (ACCU-CHEK GUIDE TEST STRIPS) Strp Use to check blood sugar once daily (fasting) 100 each 3   ??? blood sugar diagnostic (CONTOUR NEXT TEST STRIPS) Strp TEST DAILY AS DIRECTED 100 strip 3   ??? blood-glucose meter Misc Use once daily to check blood sugar as directed by your physician 1 each 0   ??? carvediloL (COREG) 12.5 MG tablet Take 1 tablet (12.5 mg total) by mouth 2 (two) times a day with meals. 180 tablet 3   ??? cyanocobalamin, vitamin B-12, 1,000 mcg/mL injection Inject 1mL once weekly for 4 weeks, THEN inject 1mL once MONTHLY thereafter. 30 mL 2   ??? cyclobenzaprine (FLEXERIL) 5 MG tablet Take 1 tablet (5 mg total) by mouth Three (3) times a day as needed for muscle spasms. 60 tablet 0   ??? dapagliflozin (FARXIGA) 10 mg Tab tablet Take 1 tablet (10 mg total) by mouth every morning. 90 tablet 3   ??? ergocalciferol, vitamin D2, (VITAMIN D2 ORAL) Take by mouth.     ??? escitalopram oxalate (LEXAPRO) 20 MG tablet Take 1 tablet (20 mg total) by mouth in the morning. 90 tablet 4   ??? evolocumab (REPATHA SURECLICK) 140 mg/mL PnIj Inject the contents of 1 pen (140mg ) under the skin every 14 days. Appointment needed for future refills. 6 mL 0   ??? folic acid (FOLVITE) 1 MG tablet Take 1 tablet (1 mg total) by mouth daily. 90 tablet 3   ??? gabapentin (NEURONTIN) 100 MG capsule Take 1 capsule (100 mg total) by mouth at bedtime as needed. 30 capsule 1   ??? insulin glargine U-300 conc (TOUJEO SOLOSTAR U-300 INSULIN) 300 unit/mL (1.5 mL) injection pen Inject 15 Units under the skin nightly. (Patient taking differently: Inject 12 Units under  the skin nightly.) 4.5 mL 3   ??? isosorbide mononitrate (IMDUR) 30 MG 24 hr tablet Take 1 tablet (30 mg total) by mouth daily. 90 tablet 3   ??? lancets Misc Use as directed 200 each 2   ??? lancets Misc Use to check blood sugar once daily (fasting) 100 each 4   ??? methotrexate, PF, (OTREXUP, PF,) 20 mg/0.4 mL AtIn Inject the contents of 1 pen (20mg ) under the skin every 7 days 4.8 mL 3   ??? nitroglycerin (NITROSTAT) 0.4 MG SL tablet PLACE 1 TABLET UNDER THE TONGUE EVERY 5 MINUTES AS NEEDED FOR CHEST PAIN UP TO 3 DOSES THEN CALL 911 IF NO RELIEF     ??? olmesartan (BENICAR) 40 MG tablet Take 1 tablet (40 mg total) by mouth in the morning. 90 tablet 4   ??? pen needle, diabetic 31 gauge x 5/16 (8 mm) Ndle Use 1-3 times daily as directed. 100 each 3   ??? predniSONE (DELTASONE) 1 MG tablet Take 4 tablets (4 mg) by mouth daily with 1 (5 mg) tablet for 28 days, THEN 3 tablets (3 mg) daily with 1 (5 mg) tablet for 28 days, THEN 2 tablets (2 mg) daily with 1 (5 mg) tablet for 28 days, THEN 1 tablet (1 mg) daily with 1 (5 mg) tablet for 28 days, THEN No dosage for 28 days while continuing with 1 (5 mg) tablet, THEN 4 tablets (4 mg total) daily for 28 days, THEN 3 tablets (3 mg total) daily for 28 days, THEN 2 tablets (2 mg total) daily for 28 days, THEN 1 tablet (1 mg total) daily for 28 days, THEN 1 tablet (1 mg total) every other day for 28 days. 720 tablet 9   ??? predniSONE (DELTASONE) 5 MG tablet Take 1 tablet (5 mg total) by mouth daily. Take with 1mg  tablets as directed for taper. 90 tablet 3   ??? safety needles 25 x 5/8  Ndle Use once a week for 4 weeks for b12 injections then once a month after 4 weeks 100 each 3   ??? semaglutide (OZEMPIC) 1 mg/dose (4 mg/3 mL) PnIj injection Inject 1 mg under the skin every seven (7) days. 9 mL 4   ??? syringe with needle (BD LUER-LOK SYRINGE) 3 mL 25 gauge x 1 Syrg Use once a week for 4 weeks for b12 injections then once a month after 4 weeks 100 each 2   ??? tocilizumab (ACTEMRA) 162 mg/0.9 mL Syrg subcutaneous injection Inject the contents of 1 syringe (162 mg total) under the skin every fourteen (14) days. 6 mL 3   ??? blood-glucose meter kit Check blood sugar daily. 1 each 0     No current facility-administered medications for this visit.      Allergies   Allergen Reactions   ??? Pravastatin Muscle Pain     Other Reaction: myalgia       EXAM & DATA   BP 130/76 (BP Site: L Arm, BP Position: Sitting, BP Cuff Size: Medium)  - Pulse 76  - Temp 37 ??C (98.6 ??F)  - Wt 72.3 kg (159 lb 8 oz)  - SpO2 96%  - BMI 30.14 kg/m??    BP   BP Readings from Last 3 Encounters:   01/13/21 130/76   12/02/20 157/100   11/18/20 97/63     Wt   Wt Readings from Last 3 Encounters:   01/13/21 72.3 kg (159 lb 8 oz)   12/02/20 72.8 kg (160 lb 6.4 oz)  11/18/20 73.5 kg (162 lb)    -  Physical Exam  GENERAL: well appearing, no distress  HEENT: Anicteric sclerae. Extraocular movement intact  CV: Regular rate and rhythm. Normal S1 and S2. No murmurs or gallops.   RESP: Relaxed respiratory effort. Clear to auscultation without wheezes or rales.    SKIN: Appropriately warm and dry  NEURO: no focal deficits, alert and oriented x 3  EXT:  No peripheral edema      ----------------------------------------------------------------------------------------------------------------------  [facility] component  Medication adherence and barriers to the treatment plan have been addressed. Opportunities to optimize healthy behaviors have been discussed. Patient / caregiver voiced understanding.    This note was dictated with Dragon Medical and may contain typos missed during proofreading.

## 2021-01-13 NOTE — Unmapped (Signed)
Madison Harris 's Repatha shipment will be delayed as a result of no refills remain on the prescription.      I have reached out to the patient  at [phone] and left a voicemail message.  We will call the patient back to reschedule the delivery upon resolution. We have not confirmed the new delivery date.

## 2021-01-13 NOTE — Unmapped (Signed)
Refill request received for patient.      Medication Requested: repatha  Last Office Visit: 08/13/2020   Next Office Visit: Visit date not found  Last Prescriber: Andrey Farmer    Nurse refill requirements met? Yes  If not met, why: 2nd attempt! Needs to ship TODAY 7/27- Pt traveling out of state and needs 3 month refill to cover vacation. Please send refills asap!    Sent to: Pharmacy per protocol  If sent to provider, which provider?:

## 2021-02-10 MED FILL — ESCITALOPRAM 20 MG TABLET: ORAL | 90 days supply | Qty: 90 | Fill #1

## 2021-03-16 DIAGNOSIS — M0600 Rheumatoid arthritis without rheumatoid factor, unspecified site: Principal | ICD-10-CM

## 2021-03-16 MED ORDER — KEVZARA 200 MG/1.14 ML SUBCUTANEOUS PEN INJECTOR
SUBCUTANEOUS | 3 refills | 0.00000 days | Status: CP
Start: 2021-03-16 — End: ?

## 2021-03-16 NOTE — Unmapped (Signed)
Have tried to obtain actemra for pt. Insurance declined to cover. [employer] MFG assistance declined to cover because of insurance denial, which is strange. MAP team appealing with Mendel Ryder to try to get MFG assistance for actemra. Dr Scarlette Calico suggested Carlis Abbott if we are unable to obtain actemra. Kevzara Rx sent to [facility] to start process of approval (either through insurance or MFG assistance) in case we are unable to get actemra.

## 2021-03-18 DIAGNOSIS — M0600 Rheumatoid arthritis without rheumatoid factor, unspecified site: Principal | ICD-10-CM

## 2021-03-24 NOTE — Unmapped (Signed)
[facility] - PHARMACIST NOTES    Actemra approved through mfg assistance program.   Will stop benefit investigation for Kevzara.     Chelsea Aus

## 2021-03-24 NOTE — Unmapped (Signed)
Error

## 2021-03-26 NOTE — Unmapped (Signed)
Patient has enough medication on hand, rescheduling refill for 11/18

## 2021-04-01 MED FILL — CARVEDILOL 12.5 MG TABLET: ORAL | 90 days supply | Qty: 180 | Fill #2

## 2021-04-01 MED FILL — OLMESARTAN 40 MG TABLET: ORAL | 90 days supply | Qty: 90 | Fill #1

## 2021-04-01 MED FILL — FARXIGA 10 MG TABLET: ORAL | 90 days supply | Qty: 90 | Fill #1

## 2021-04-01 MED FILL — PREDNISONE 1 MG TABLET: ORAL | 56 days supply | Qty: 140 | Fill #2

## 2021-04-01 MED FILL — PREDNISONE 5 MG TABLET: ORAL | 84 days supply | Qty: 84 | Fill #3

## 2021-04-26 ENCOUNTER — Ambulatory Visit
Admit: 2021-04-26 | Discharge: 2021-04-27 | Payer: PRIVATE HEALTH INSURANCE | Attending: Internal Medicine | Primary: Internal Medicine

## 2021-04-26 DIAGNOSIS — I10 Essential (primary) hypertension: Principal | ICD-10-CM

## 2021-04-26 DIAGNOSIS — I2510 Atherosclerotic heart disease of native coronary artery without angina pectoris: Principal | ICD-10-CM

## 2021-04-26 DIAGNOSIS — Z794 Long term (current) use of insulin: Principal | ICD-10-CM

## 2021-04-26 DIAGNOSIS — E119 Type 2 diabetes mellitus without complications: Principal | ICD-10-CM

## 2021-04-26 DIAGNOSIS — Z23 Encounter for immunization: Principal | ICD-10-CM

## 2021-04-26 DIAGNOSIS — Z1231 Encounter for screening mammogram for malignant neoplasm of breast: Principal | ICD-10-CM

## 2021-04-26 DIAGNOSIS — E782 Mixed hyperlipidemia: Principal | ICD-10-CM

## 2021-04-26 LAB — ALBUMIN / CREATININE URINE RATIO
ALBUMIN QUANT URINE: 4.4 mg/dL
ALBUMIN/CREATININE RATIO: 22.5 ug/mg (ref 0.0–30.0)
CREATININE, URINE: 195.3 mg/dL

## 2021-04-26 LAB — LIPID PANEL
CHOLESTEROL/HDL RATIO SCREEN: 2.3 (ref 1.0–4.5)
CHOLESTEROL: 126 mg/dL (ref <=200)
HDL CHOLESTEROL: 55 mg/dL (ref 40–60)
LDL CHOLESTEROL CALCULATED: 53 mg/dL (ref 40–99)
NON-HDL CHOLESTEROL: 71 mg/dL (ref 70–130)
TRIGLYCERIDES: 89 mg/dL (ref 0–150)
VLDL CHOLESTEROL CAL: 17.8 mg/dL (ref 11–41)

## 2021-04-26 LAB — COMPREHENSIVE METABOLIC PANEL
ALBUMIN: 4.2 g/dL (ref 3.4–5.0)
ALKALINE PHOSPHATASE: 57 U/L (ref 46–116)
ALT (SGPT): 19 U/L (ref 10–49)
ANION GAP: 6 mmol/L (ref 5–14)
AST (SGOT): 21 U/L (ref <=34)
BILIRUBIN TOTAL: 0.6 mg/dL (ref 0.3–1.2)
BLOOD UREA NITROGEN: 15 mg/dL (ref 9–23)
BUN / CREAT RATIO: 18
CALCIUM: 9.9 mg/dL (ref 8.7–10.4)
CHLORIDE: 105 mmol/L (ref 98–107)
CO2: 27 mmol/L (ref 20.0–31.0)
CREATININE: 0.84 mg/dL — ABNORMAL HIGH
EGFR CKD-EPI (2021) FEMALE: 78 mL/min/{1.73_m2} (ref >=60)
GLUCOSE RANDOM: 104 mg/dL — ABNORMAL HIGH (ref 70–99)
POTASSIUM: 3.7 mmol/L (ref 3.4–4.8)
PROTEIN TOTAL: 7 g/dL (ref 5.7–8.2)
SODIUM: 138 mmol/L (ref 135–145)

## 2021-04-26 LAB — HEMOGLOBIN A1C
ESTIMATED AVERAGE GLUCOSE: 140 mg/dL
HEMOGLOBIN A1C: 6.5 % — ABNORMAL HIGH (ref 4.8–5.6)

## 2021-04-26 MED ORDER — ACCU-CHEK GUIDE TEST STRIPS
3 refills | 0 days | Status: CP
Start: 2021-04-26 — End: ?

## 2021-04-26 NOTE — Unmapped (Signed)
[facility]  [address] [place]  [place] [address]  Ph: [phone]           ASSESSMENT & PLAN   Madison Harris was seen today for follow-up.    Diagnoses and all orders for this visit:    CAD s/p PCI  Essential hypertension  Mixed hyperlipidemia  T2DM, controlled  BG has improved dramatically with significant lifestyle modifications.  Congratulated her on these efforts and encouraged maintenance of them as well.   Check labs today - continue Ozempic, but ok to hold Farxiga and insulin.  Check labs again in 3 months with physical.  -     Albumin/creatinine urine ratio; Future  -     Comprehensive Metabolic Panel; Future  -     Lipid Panel; Future  -     Hemoglobin A1c; Future  -     blood sugar diagnostic (ACCU-CHEK GUIDE TEST STRIPS) Strp; Use to check blood sugar once daily (fasting)    Encounter for screening mammogram for malignant neoplasm of breast  -     Mammo Screening Bilateral; Future        HPI     Chief Complaint   Patient presents with   ??? Follow-up     Madison Harris is a 65 y.o. female in today for 32-month follow-up from 11/09/2020.    She is following up for hypertension, hyperlipidemia, CAD, T2DM.  She remains on amlodipine, aspirin, carvedilol, Repatha, Imdur, Ozempic.   She stopped Insulin and [name] about a week ago.  BG meter reviewed today.  1 week avg 107.      I have reviewed and updated the past medical, social, and family history today and updated them in the EMR.    EXAM & DATA   T   - P 66 - RR   - SpO2 97%  BP   BP Readings from Last 3 Encounters:   04/26/21 108/66   01/13/21 130/76   12/02/20 157/100     Wt   Wt Readings from Last 3 Encounters:   04/26/21 68.5 kg (151 lb)   01/13/21 72.3 kg (159 lb 8 oz)   12/02/20 72.8 kg (160 lb 6.4 oz)      Data Reviewed:  Lab Results   Component Value Date    NA 140 12/02/2020    K 4.2 12/02/2020    BUN 13 12/02/2020    CREATININE 0.66 12/02/2020    A1C 6.7 (H) 12/02/2020    CHOL 121 11/02/2020 LDL 37 (L) 11/02/2020    HDL 58 11/02/2020    TRIG 829 11/02/2020    ALT 21 12/02/2020       Lab Results   Component Value Date    A1C 6.7 (H) 12/02/2020    A1C 6.7 (H) 11/02/2020    A1C 8.9 (H) 08/10/2020    A1C 8.0 (H) 04/06/2020    A1C 7.1 (H) 07/26/2019    A1C 7.8 (H) 04/25/2019    A1C 9.0 (H) 01/23/2019    A1C 7.9 (H) 11/14/2018    A1C 8.4 (H) 07/25/2018     Lab Results   Component Value Date    TRIG 130 11/02/2020    TRIG 87 08/10/2020    TRIG 144 04/06/2020    TRIG 120 07/26/2019    TRIG 131 07/25/2018     Lab Results   Component Value Date    LDL 37 (L) 11/02/2020    LDL 46 08/10/2020  LDL 27 (L) 04/06/2020    LDL 21 07/26/2019    LDL 38.8 07/25/2018     Lab Results   Component Value Date    ALT 21 12/02/2020    ALT 19 11/02/2020    ALT 23 08/10/2020    ALT 20 04/06/2020    ALT 14 11/06/2019    ALT 17 07/26/2019    ALT 18 04/25/2019    ALT 21 01/23/2019    ALT 17 11/14/2018    ALT 27.0 07/25/2018     No results found for: APOB  No results found for: HSCRP  Lab Results   Component Value Date    ALBCRERAT 7.7 11/02/2020    ALBCRERAT  08/10/2020      Comment:      Unable to calculate due to value below lower limit of assay linearity.    ALBCRERAT 14.3 04/06/2020    ALBCRERAT 20.9 07/26/2019    ALBCRERAT 15.6 07/25/2018     No results found for: FERRITIN, VITD      ----------------------------------------------------------------------------------------------------------------------  This note was dictated with Dragon Medical and may contain typos missed during proofreading.

## 2021-04-27 NOTE — Unmapped (Signed)
Addended by: Brain Hilts on: 04/26/2021 05:15 PM     Modules accepted: Orders

## 2021-05-03 NOTE — Unmapped (Signed)
FYI    Advised pt to go to UC. She lives in [place] so recommended [facility] near her home in [place] and provided contact information for [employer]. She agrees to go this am for eval and tx as indicated. Recommended good hydration, otc acetaminophen, gargling w/ mouthwash and throat sprays keeping throat moist.     Tobacco Use: Low Risk    ??? Smoking Tobacco Use: Never   ??? Smokeless Tobacco Use: Never   ??? Passive Exposure: Not on file        Pink Eye & possible Flu  Message [id number]  From   Johnn Hai To   P [facility] Internal Medicine Clinical Staff (supporting Tye Maryland [name], MD) Sent   05/02/2021 ??9:30 AM   ??   I was there past Mo day. Mentioned to nurse I babysat 3 grandkids. Each were diagnosed with strep & type A Flu. One with add???l pink eye. Pretty sure I have pink eye. It???s running thick yellow gunk & swollen shut. Very sore around eye socket. Achy pretty much all over & sore throat. So far no fever. Can we do virtual appointment?  Do you see ???sick??? people in your office?  Thanks so much!!!        Responsible Party    Charlesetta Ivory, LPN

## 2021-05-03 NOTE — Unmapped (Unsigned)
We have no openings at [facility] today. Should I recommend she go to UC?  She did not present with conjunctivitis s/s last Monday.    She also has a sore throat and is achy all over.    Sending to [facility] for triage.

## 2021-05-07 DIAGNOSIS — I2510 Atherosclerotic heart disease of native coronary artery without angina pectoris: Principal | ICD-10-CM

## 2021-05-07 MED ORDER — REPATHA SURECLICK 140 MG/ML SUBCUTANEOUS PEN INJECTOR
SUBCUTANEOUS | 0 refills | 84.00000 days | Status: CP
Start: 2021-05-07 — End: ?
  Filled 2021-06-24: qty 6, 84d supply, fill #0

## 2021-05-07 NOTE — Unmapped (Signed)
Refill request received for patient.      Medication Requested: repatha  Last Office Visit: 08/13/2020   Next Office Visit: Visit date not found  Last Prescriber: Eppie Gibson    Nurse refill requirements met? Yes  If not met, why: n/a    Sent to: Provider for signing  If sent to provider, which provider?: Eppie Gibson

## 2021-05-17 NOTE — Unmapped (Signed)
The [facility] has made a second and final attempt to reach this patient to refill the following medication:Otrexup.      We have left voicemails on the following phone numbers: [phone] and have sent a MyChart message.    Dates contacted: 11/18, 11/28  Last scheduled delivery: 7/26    The patient may be at risk of non-compliance with this medication. The patient should call the [facility] at [phone]  Option 4, then Option 2 (all other specialty patients) to refill medication.    Olga Millers   [facility] Specialty Technician

## 2021-06-23 DIAGNOSIS — M353 Polymyalgia rheumatica: Principal | ICD-10-CM

## 2021-06-23 DIAGNOSIS — E1165 Type 2 diabetes mellitus with hyperglycemia: Principal | ICD-10-CM

## 2021-06-23 DIAGNOSIS — M0600 Rheumatoid arthritis without rheumatoid factor, unspecified site: Principal | ICD-10-CM

## 2021-06-23 DIAGNOSIS — Z794 Long term (current) use of insulin: Principal | ICD-10-CM

## 2021-06-23 MED ORDER — OZEMPIC 1 MG/DOSE (4 MG/3 ML) SUBCUTANEOUS PEN INJECTOR
SUBCUTANEOUS | 1 refills | 84 days | Status: CP
Start: 2021-06-23 — End: ?

## 2021-06-23 MED ORDER — ACTEMRA ACTPEN 162 MG/0.9 ML SUBCUTANEOUS PEN INJECTOR
SUBCUTANEOUS | 3 refills | 0 days | Status: CP
Start: 2021-06-23 — End: ?

## 2021-06-23 NOTE — Unmapped (Signed)
[facility] Clinical Assessment & Refill Coordination Note    Patient has never started Actemra and was unable to get from [facility].  Gave her phone number for [facility] at [phone] to start Actemra.         Madison Harris, DOB: 1955-08-27  Phone: [phone] (home)     All above HIPAA information was verified with patient.     Was a Nurse, learning disability used for this call? No    Specialty Medication(s):   Inflammatory Disorders: Otrexup and Repatha     Current Outpatient Medications   Medication Sig Dispense Refill   ??? amLODIPine (NORVASC) 2.5 MG tablet Take 1 tablet (2.5 mg total) by mouth in the morning. 90 tablet 1   ??? aspirin (ECOTRIN) 81 MG tablet Take 81 mg by mouth.     ??? blood sugar diagnostic (ACCU-CHEK GUIDE TEST STRIPS) Strp Use to check blood sugar once daily (fasting) 100 each 3   ??? blood-glucose meter kit Check blood sugar daily. 1 each 0   ??? blood-glucose meter Misc Use once daily to check blood sugar as directed by your physician 1 each 0   ??? carvediloL (COREG) 12.5 MG tablet Take 1 tablet (12.5 mg total) by mouth 2 (two) times a day with meals. 180 tablet 3   ??? cyanocobalamin, vitamin B-12, 1,000 mcg/mL injection Inject 1mL once weekly for 4 weeks, THEN inject 1mL once MONTHLY thereafter. 30 mL 2   ??? dapagliflozin (FARXIGA) 10 mg Tab tablet Take 1 tablet (10 mg total) by mouth every morning. 90 tablet 3   ??? ergocalciferol, vitamin D2, (VITAMIN D2 ORAL) Take by mouth.     ??? escitalopram oxalate (LEXAPRO) 20 MG tablet Take 1 tablet (20 mg total) by mouth in the morning. 90 tablet 4   ??? evolocumab (REPATHA SURECLICK) 140 mg/mL PnIj Inject the contents of 1 pen (140mg ) under the skin every 14 days. Appointment needed for future refills. 6 mL 0   ??? folic acid (FOLVITE) 1 MG tablet Take 1 tablet (1 mg total) by mouth daily. 90 tablet 3   ??? isosorbide mononitrate (IMDUR) 30 MG 24 hr tablet Take 1 tablet (30 mg total) by mouth daily. 90 tablet 3   ??? lancets Misc Use as directed 200 each 2   ??? lancets Misc Use to check blood sugar once daily (fasting) 100 each 4   ??? methotrexate, PF, (OTREXUP, PF,) 20 mg/0.4 mL AtIn Inject the contents of 1 pen (20mg ) under the skin every 7 days 4.8 mL 3   ??? nitroglycerin (NITROSTAT) 0.4 MG SL tablet PLACE 1 TABLET UNDER THE TONGUE EVERY 5 MINUTES AS NEEDED FOR CHEST PAIN UP TO 3 DOSES THEN CALL 911 IF NO RELIEF     ??? olmesartan (BENICAR) 40 MG tablet Take 1 tablet (40 mg total) by mouth daily. 90 tablet 4   ??? pen needle, diabetic 31 gauge x 5/16 (8 mm) Ndle Use 1-3 times daily as directed. 100 each 3   ??? predniSONE (DELTASONE) 5 MG tablet Take 1 tablet (5 mg total) by mouth daily. Take with 1mg  tablets as directed for taper. 90 tablet 3   ??? safety needles 25 x 5/8  Ndle Use once a week for 4 weeks for b12 injections then once a month after 4 weeks 100 each 3   ??? syringe with needle (BD LUER-LOK SYRINGE) 3 mL 25 gauge x 1 Syrg Use once a week for 4 weeks for b12 injections then once a  month after 4 weeks 100 each 2   ??? tocilizumab (ACTEMRA) 162 mg/0.9 mL Syrg subcutaneous injection Inject the contents of 1 syringe (162 mg total) under the skin every fourteen (14) days. (Patient not taking: Reported on 04/26/2021) 6 mL 3     No current facility-administered medications for this visit.        Changes to medications: [name] Reports stopping the following medications: amlodipine, not started. Never called Actemra    Allergies   Allergen Reactions   ??? Pravastatin Muscle Pain     Other Reaction: myalgia       Changes to allergies: No    SPECIALTY MEDICATION ADHERENCE             Specialty medication(s) dose(s) confirmed: Regimen is correct and unchanged.     Are there any concerns with adherence? Yes: patient does not remmeber doses     Adherence counseling provided? Yes: patient will set an alarm on the phone for these injections    CLINICAL MANAGEMENT AND INTERVENTION      Clinical Benefit Assessment:    Do you feel the medicine is effective or helping your condition? Yes    Clinical Benefit counseling provided? Reasonable expectations discussed: Patient may feel more relief once starting Actemra     Adverse Effects Assessment:    Are you experiencing any side effects? No    Are you experiencing difficulty administering your medicine? No    Quality of Life Assessment:    Quality of Life    Rheumatology  Oncology  Dermatology  Cystic Fibrosis          How many days over the past month did your condition  keep you from your normal activities? For example, brushing your teeth or getting up in the morning. does have a few days where she is not feeling well    Have you discussed this with your provider? No - pharmacist will consult provider    Acute Infection Status:    Acute infections noted within Epic:  No active infections  Patient reported infection: None    Therapy Appropriateness:    Is therapy appropriate and patient progressing towards therapeutic goals? Yes, therapy is appropriate and should be continued    DISEASE/MEDICATION-SPECIFIC INFORMATION      For patients on injectable medications: Patient currently has 1 doses left.  Next injection is scheduled for 06/28/21. (Of Otrexup).  No Repatha on hand.    PATIENT SPECIFIC NEEDS     - Does the patient have any physical, cognitive, or cultural barriers? No    - Is the patient high risk? No    - Does the patient require a Care Management Plan? No     SOCIAL DETERMINANTS OF HEALTH     At the [facility], we have learned that life circumstances - like trouble affording food, housing, utilities, or transportation can affect the health of many of our patients.   That is why we wanted to ask: are you currently experiencing any life circumstances that are negatively impacting your health and/or quality of life? Patient declined to answer    Social Determinants of Health     Food Insecurity: Not on file   Tobacco Use: Low Risk    ??? Smoking Tobacco Use: Never   ??? Smokeless Tobacco Use: Never   ??? Passive Exposure: Not on file   Transportation Needs: Not on file   Alcohol Use: Not At Risk   ??? How often do you have a  drink containing alcohol?: 4+ times per week   ??? How many drinks containing alcohol do you have on a typical day when you are drinking?: 3 - 4   ??? How often do you have 5 or more drinks on one occasion?: Never   Housing/Utilities: Unknown   ??? Within the past 12 months, have you ever stayed: outside, in a car, in a tent, in an overnight shelter, or temporarily in someone else's home (i.e. couch-surfing)?: No   ??? Are you worried about losing your housing?: Not on file   ??? Within the past 12 months, have you been unable to get utilities (heat, electricity) when it was really needed?: Not on file   Substance Use: Low Risk    ??? Taken prescription drugs for non-medical reasons: Never   ??? Taken illegal drugs: Never   ??? Patient indicated they have taken drugs in the past year for non-medical reasons: Yes, [positive answer(s)]: Not on file   Financial Resource Strain: Not on file   Physical Activity: Not on file   Health Literacy: Medium Risk   ??? : Rarely   Stress: Not on file   Intimate Partner Violence: Not on file   Depression: Not at risk   ??? PHQ-2 Score: 0   Social Connections: Not on file       Would you be willing to receive help with any of the needs that you have identified today? Not applicable       SHIPPING     Specialty Medication(s) to be Shipped:   Inflammatory Disorders: Otrexup and Repatha    Other medication(s) to be shipped: Ozempic, isosorbide mononitrate, folic acid, lexapro     Changes to insurance: No    Delivery Scheduled: Yes, Expected medication delivery date: 06/25/21.     Medication will be delivered via UPS to the confirmed prescription address in [facility].    The patient will receive a drug information handout for each medication shipped and additional FDA Medication Guides as required.  Verified that patient has previously received a Conservation officer, historic buildings and a Surveyor, mining.    The patient or caregiver noted above participated in the development of this care plan and knows that they can request review of or adjustments to the care plan at any time.      All of the patient's questions and concerns have been addressed.    Julianne Rice   [facility] Specialty Pharmacist

## 2021-06-23 NOTE — Unmapped (Signed)
Received the following noted from pharmacy team:  Good Morning,   Patient has not started Actemra yet as she was not able to get it from the [facility]. ??She stated she tried to call them but they did not have an RX to fill for her. ??Can you please send an RX to [facility]? ?? ??     She is also not adherent to Otrexup therapy and I've spoken to her about setting an alarm on her phone to remind her.       Actemra sent to [facility] pharmacy per request.

## 2021-06-24 DIAGNOSIS — M0609 Rheumatoid arthritis without rheumatoid factor, multiple sites: Principal | ICD-10-CM

## 2021-06-24 MED FILL — OTREXUP (PF) 20 MG/0.4 ML SUBCUTANEOUS AUTO-INJECTOR: SUBCUTANEOUS | 28 days supply | Qty: 1.6 | Fill #2

## 2021-06-24 MED FILL — FOLIC ACID 1 MG TABLET: ORAL | 90 days supply | Qty: 90 | Fill #2

## 2021-06-24 MED FILL — ESCITALOPRAM 20 MG TABLET: ORAL | 90 days supply | Qty: 90 | Fill #2

## 2021-06-24 MED FILL — ISOSORBIDE MONONITRATE ER 30 MG TABLET,EXTENDED RELEASE 24 HR: ORAL | 90 days supply | Qty: 90 | Fill #2

## 2021-07-15 ENCOUNTER — Ambulatory Visit: Admit: 2021-07-15 | Discharge: 2021-07-16 | Payer: PRIVATE HEALTH INSURANCE

## 2021-07-15 DIAGNOSIS — M353 Polymyalgia rheumatica: Principal | ICD-10-CM

## 2021-07-15 DIAGNOSIS — M0609 Rheumatoid arthritis without rheumatoid factor, multiple sites: Principal | ICD-10-CM

## 2021-07-15 DIAGNOSIS — Z79631 Methotrexate, long term, current use: Principal | ICD-10-CM

## 2021-07-15 LAB — CBC W/ AUTO DIFF
BASOPHILS ABSOLUTE COUNT: 0 10*9/L (ref 0.0–0.1)
BASOPHILS RELATIVE PERCENT: 0.8 %
EOSINOPHILS ABSOLUTE COUNT: 0 10*9/L (ref 0.0–0.5)
EOSINOPHILS RELATIVE PERCENT: 1.5 %
HEMATOCRIT: 41.7 % (ref 34.0–44.0)
HEMOGLOBIN: 14.3 g/dL (ref 11.3–14.9)
LYMPHOCYTES ABSOLUTE COUNT: 1.3 10*9/L (ref 1.1–3.6)
LYMPHOCYTES RELATIVE PERCENT: 41.9 %
MEAN CORPUSCULAR HEMOGLOBIN CONC: 34.2 g/dL (ref 32.0–36.0)
MEAN CORPUSCULAR HEMOGLOBIN: 34.3 pg — ABNORMAL HIGH (ref 25.9–32.4)
MEAN CORPUSCULAR VOLUME: 100.1 fL — ABNORMAL HIGH (ref 77.6–95.7)
MEAN PLATELET VOLUME: 8.2 fL (ref 6.8–10.7)
MONOCYTES ABSOLUTE COUNT: 0.2 10*9/L — ABNORMAL LOW (ref 0.3–0.8)
MONOCYTES RELATIVE PERCENT: 6.1 %
NEUTROPHILS ABSOLUTE COUNT: 1.6 10*9/L — ABNORMAL LOW (ref 1.8–7.8)
NEUTROPHILS RELATIVE PERCENT: 49.7 %
PLATELET COUNT: 220 10*9/L (ref 150–450)
RED BLOOD CELL COUNT: 4.17 10*12/L (ref 3.95–5.13)
RED CELL DISTRIBUTION WIDTH: 13.2 % (ref 12.2–15.2)
WBC ADJUSTED: 3.2 10*9/L — ABNORMAL LOW (ref 3.6–11.2)

## 2021-07-15 LAB — AST: AST (SGOT): 14 U/L (ref <=34)

## 2021-07-15 LAB — ALBUMIN: ALBUMIN: 4 g/dL (ref 3.4–5.0)

## 2021-07-15 LAB — ALT: ALT (SGPT): 19 U/L (ref 10–49)

## 2021-07-15 LAB — CREATININE
CREATININE: 0.66 mg/dL
EGFR CKD-EPI (2021) FEMALE: >90 mL/min/{1.73_m2} (ref >=60)

## 2021-07-15 MED ORDER — FOLIC ACID 1 MG TABLET
ORAL_TABLET | Freq: Every day | ORAL | 3 refills | 90.00000 days | Status: CP
Start: 2021-07-15 — End: ?
  Filled 2021-08-17: qty 180, 90d supply, fill #0

## 2021-07-15 MED ORDER — PREDNISONE 5 MG TABLET
ORAL_TABLET | Freq: Every day | ORAL | 1 refills | 90.00000 days | Status: CP
Start: 2021-07-15 — End: 2021-12-02

## 2021-07-15 MED ORDER — OTREXUP (PF) 20 MG/0.4 ML SUBCUTANEOUS AUTO-INJECTOR
SUBCUTANEOUS | 3 refills | 84 days | Status: CP
Start: 2021-07-15 — End: ?
  Filled 2021-08-17: qty 4.8, 84d supply, fill #0

## 2021-07-15 MED ORDER — PREDNISONE 1 MG TABLET
ORAL_TABLET | Freq: Every day | ORAL | 1 refills | 135.00000 days | Status: CP
Start: 2021-07-15 — End: 2022-07-15
  Filled 2021-07-26: qty 90, 90d supply, fill #0

## 2021-07-15 NOTE — Unmapped (Addendum)
Plan to start reducing prednisone after you have been taking actemra for 1 month.   Around the time of your 3rd actemra shot, reduce prednisone to 6 mg.    Taper prednisone to 6 mg for one month.   Then prednisone 5 mg daily for one month.   Then prednisone 4 mg daily for one month.   Then prednisone 3 mg daily for one month.   Then prednisone 2 mg daily for 1 month.  Then prednisone 1 mg daily for 1 month.   Then prednisone 1 mg every other day for 1 month.   Then stop prednisone.

## 2021-07-15 NOTE — Unmapped (Signed)
REASON FOR VISIT: f/u PMR    HISTORY: Madison Harris is a 66 y.o. 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  Initially diagnosed with PMR by outside rheumatologist, severe stiffness in hips and neck. 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 Started SQ mtx (otrexup) in 04/2018. Transferred care to our clinic in 09/2018 due to insurance. Mtx increased to 20 mg at that visit. Repeat RF and CCP were negative. Suspect that underlying diagnosis is PMR.  Treatment history:  - Prednisone taper  - Difficulty tapering prednisone, so mtx (otrexup) added 04/2018  - Transferred care to [facility] rheumatology in 09/2018  - Worsening pain again after tapering to 3 mg, increased back to 7.5 mg in 10/2019  - She then reported increasing pain and transient HA, pain with chewing. Recommended further increase in prednisone, but pt wished to remain on 7.5 mg.   - Hip XR in 10/2019 revealed ?mass over sacrum, but MRI f/u revealed no sacral mass.   - Worsening HAs, though not clearly GCA, increased pred to 10 mg in 11/2019 with plans to start actemra, delayed due to [place] out of state.   - Attempted taper from 10 mg pred in 05/2020, but pt unable to tolerate tapering below 7 mg.   - Recommended adding actemra in 11/2020, delay due to insurance, started 06/2021  - Current medication regimen: mtx sq (otrexup) 20 mg qwk, FA 1 mg qd, prednisone 7 mg qd, actemra 162 mg q 14 days.     Interim history:   Presents today for f/u.     Currently on prednisone 7 mg. She received actemra in the interim, has taken only one dose.   Has good days and bad days. She would like to increase prednisone due to pain, but knows that it would be better not to do this. Pain in legs, hips, wrists, ankles. Sometimes the pain is in the joints, sometimes it is not in the joints.   No swelling joints. Some mornings she has stiffness, but mostly does not.   More pain in wrists and ankles x 1 mo. Stiffness in hand at night. No numbness or tingling.      DM well controlled. Most recent A1C was 6.5    No fevers, chills, wt loss.     She has diarrhea the day after methotrexate dose.       CURRENT MEDICATIONS:  Current Outpatient Medications   Medication Sig Dispense Refill   ??? amLODIPine (NORVASC) 2.5 MG tablet Take 1 tablet (2.5 mg total) by mouth in the morning. (Patient not taking: Reported on 06/23/2021) 90 tablet 1   ??? aspirin (ECOTRIN) 81 MG tablet Take 81 mg by mouth.     ??? blood sugar diagnostic (ACCU-CHEK GUIDE TEST STRIPS) Strp Use to check blood sugar once daily (fasting) 100 each 3   ??? blood-glucose meter kit Check blood sugar daily. 1 each 0   ??? blood-glucose meter Misc Use once daily to check blood sugar as directed by your physician 1 each 0   ??? carvediloL (COREG) 12.5 MG tablet Take 1 tablet (12.5 mg total) by mouth 2 (two) times a day with meals. 180 tablet 3   ??? cyanocobalamin, vitamin B-12, 1,000 mcg/mL injection Inject 1mL once weekly for 4 weeks, THEN inject 1mL once MONTHLY thereafter. 30 mL 2   ??? dapagliflozin (FARXIGA) 10 mg Tab tablet Take 1 tablet (10 mg total) by mouth every morning. 90 tablet 3   ???  ergocalciferol, vitamin D2, (VITAMIN D2 ORAL) Take by mouth.     ??? escitalopram oxalate (LEXAPRO) 20 MG tablet Take 1 tablet (20 mg total) by mouth in the morning. 90 tablet 4   ??? evolocumab (REPATHA SURECLICK) 140 mg/mL PnIj Inject the contents of 1 pen (140mg ) under the skin every 14 days. Appointment needed for future refills. 6 mL 0   ??? folic acid (FOLVITE) 1 MG tablet Take 1 tablet (1 mg total) by mouth daily. 90 tablet 3   ??? isosorbide mononitrate (IMDUR) 30 MG 24 hr tablet Take 1 tablet (30 mg total) by mouth daily. 90 tablet 3   ??? lancets Misc Use as directed 200 each 2   ??? lancets Misc Use to check blood sugar once daily (fasting) 100 each 4   ??? methotrexate, PF, (OTREXUP, PF,) 20 mg/0.4 mL AtIn Inject the contents of 1 pen (20mg ) under the skin every 7 days 4.8 mL 3   ??? nitroglycerin (NITROSTAT) 0.4 MG SL tablet PLACE 1 TABLET UNDER THE TONGUE EVERY 5 MINUTES AS NEEDED FOR CHEST PAIN UP TO 3 DOSES THEN CALL 911 IF NO RELIEF     ??? olmesartan (BENICAR) 40 MG tablet Take 1 tablet (40 mg total) by mouth daily. 90 tablet 4   ??? pen needle, diabetic 31 gauge x 5/16 (8 mm) Ndle Use 1-3 times daily as directed. 100 each 3   ??? safety needles 25 x 5/8  Ndle Use once a week for 4 weeks for b12 injections then once a month after 4 weeks 100 each 3   ??? semaglutide (OZEMPIC) 1 mg/dose (4 mg/3 mL) PnIj injection Inject 1 mg under the skin every seven (7) days. 9 mL 1   ??? syringe with needle (BD LUER-LOK SYRINGE) 3 mL 25 gauge x 1 Syrg Use once a week for 4 weeks for b12 injections then once a month after 4 weeks 100 each 2   ??? tocilizumab (ACTEMRA ACTPEN) 162 mg/0.9 mL PnIj Inject 162 mg under the skin every fourteen (14) days. 5.4 mL 3     No current facility-administered medications for this visit.       Past Medical History:   Diagnosis Date   ??? Cataract    ??? Diabetes mellitus (CMS-HCC)     Type 2    ??? Hyperlipidemia    ??? Hypertension         Record Review: Available records were reviewed, including pertinent office visits, labs, and imaging.      REVIEW OF SYSTEMS: Ten system were reviewed and negative except as noted above.    PHYSICAL EXAM:  VITAL SIGNS:   Vitals:    07/15/21 1041   BP: 144/78   BP Site: L Arm   BP Position: Sitting   BP Cuff Size: Medium   Pulse: 70   Temp: 36 ??C (96.8 ??F)   TempSrc: Temporal   Weight: 70 kg (154 lb 6.4 oz)   Height: 154.9 cm (5' 1)     General:   Pleasant 66 y.o.female in no acute distress, WDWN   Cardiovascular:  Regular rate and rhythm. No murmur, rub, or gallop. No lower extremity edema.    Lungs:  Clear to auscultation.Normal respiratory effort.    Musculoskeletal:   General: Ambulates w/o assistance.  Hands: No overt swelling. Tenderness MCP 2 on L.  Able to make a tight fist.  Wrists: Reduced extension of the left  Elbows: FROM w/o swelling or tenderness   Shoulders: FROM w/o pain  Hips: Good range of motion bilaterally without tenderness. Pain in lateral R hip with external rotation and in anterior L hip with external rotation.   Knees: FROM w/o effusions   Ankles: No swelling or tenderness   Feet: No pain with MTP squeeze   Psych:  Appropriate affect and mood   Skin:  No rashes.          ASSESSMENT/PLAN:    1.  Seronegative RA/PMR  Continue actemra 162 mg q 14 days, mtx sq 20 mg qwk. Increase folic acid to 2 mg qd. Continue prednisone 7 mg until she has been on actemra x 1 mo, then try reducing by 1 mg q mo.     2. Methotrexate, long term, current use  - Albumin  - ALT  - AST  - CBC w/ Differential  - Creatinine    3.  Osteopenia  Noted on DEXA 11/21/2019, T score L spine +0.5, femur neck -1.6. FRAX score showing 10 yr probability of 13% for of major osteoporotic fracture and a 1.3% for hip fracture.  Thus will not start antiresorptive therapy at this time.    Recommend resuming calcium and vitamin D supplements.             HCM:   -Pneumonia vaccine: Declines  - COVID-19 vaccine status:Moderna 08/16/2019, 09/13/2019, 05/19/2020, 08/27/2020. Declines bivalent booster   - Annual Influenza vaccine. Status: UTD per pt  - Bone health: See note on osteopenia above  -Contraception: Hysterectomy      Greater than 30 minutes spent in visit with patient, including pre and postvisit activities.  Return in 3 mo with myself and 6 mo with Dr Scarlette Calico

## 2021-07-19 DIAGNOSIS — Z794 Long term (current) use of insulin: Principal | ICD-10-CM

## 2021-07-19 DIAGNOSIS — E1165 Type 2 diabetes mellitus with hyperglycemia: Principal | ICD-10-CM

## 2021-07-19 NOTE — Unmapped (Signed)
Patient called back and changed fill date to 2/6, delivery date 2/7 via UPS.

## 2021-07-19 NOTE — Unmapped (Signed)
[facility] Refill Coordination Note    Specialty Medication(s) to be Shipped:   Inflammatory Disorders: Otrexup-pt denied due to having (4) injections remaining     Other medication(s) to be shipped: carvedilol 12.5mg ,farxiga 10mg ,folic acid,olmesartan ,prednisone 1 & 5mg      Madison Harris, DOB: June 16, 1956  Phone: [phone] (home)       All above HIPAA information was verified with patient.     Was a Nurse, learning disability used for this call? No    Completed refill call assessment today to schedule patient's medication shipment from the [facility] [phone]).  All relevant notes have been reviewed.     Specialty medication(s) and dose(s) confirmed: Regimen is correct and unchanged.   Changes to medications: Madison Harris reports no changes at this time.  Changes to insurance: No  New side effects reported not previously addressed with a pharmacist or physician: None reported  Questions for the pharmacist: No    Confirmed patient received a Conservation officer, historic buildings and a Surveyor, mining with first shipment. The patient will receive a drug information handout for each medication shipped and additional FDA Medication Guides as required.       DISEASE/MEDICATION-SPECIFIC INFORMATION        N/A    SPECIALTY MEDICATION ADHERENCE     Medication Adherence    Patient reported X missed doses in the last month: 0  Specialty Medication: otrexup 20mg /0.29ml  Patient is on additional specialty medications: No  Patient is on more than two specialty medications: No  Any gaps in refill history greater than 2 weeks in the last 3 months: no  Demonstrates understanding of importance of adherence: yes  Informant: patient  Reliability of informant: reliable  Provider-estimated medication adherence level: good  Patient is at risk for Non-Adherence: No  Reasons for non-adherence: no problems identified  Confirmed plan for next specialty medication refill: delivery by pharmacy  Refills needed for supportive medications: not needed          Refill Coordination    Has the Patients' Contact Information Changed: No  Is the Shipping Address Different: No         Were doses missed due to medication being on hold? No    Otrexup 20mg /0.4 mg/ml: 28 days of medicine on hand         REFERRAL TO PHARMACIST     Referral to the pharmacist: Not needed      [facility]     Shipping address confirmed in Epic.     Delivery Scheduled: Yes, Expected medication delivery date: 02/01.     Medication will be delivered via UPS to the prescription address in Epic WAM.    Madison Harris   [facility] Specialty Technician

## 2021-07-26 DIAGNOSIS — E1165 Type 2 diabetes mellitus with hyperglycemia: Principal | ICD-10-CM

## 2021-07-26 DIAGNOSIS — Z794 Long term (current) use of insulin: Principal | ICD-10-CM

## 2021-07-26 MED FILL — CARVEDILOL 12.5 MG TABLET: ORAL | 90 days supply | Qty: 180 | Fill #3

## 2021-07-26 MED FILL — OLMESARTAN 40 MG TABLET: ORAL | 90 days supply | Qty: 90 | Fill #2

## 2021-07-26 MED FILL — PREDNISONE 1 MG TABLET: ORAL | 30 days supply | Qty: 120 | Fill #0

## 2021-07-27 ENCOUNTER — Ambulatory Visit: Admit: 2021-07-27 | Payer: PRIVATE HEALTH INSURANCE

## 2021-07-27 MED FILL — FARXIGA 10 MG TABLET: ORAL | 90 days supply | Qty: 90 | Fill #2

## 2021-08-12 NOTE — Unmapped (Signed)
[facility] Refill Coordination Note    Specialty Medication(s) to be Shipped:   Inflammatory Disorders: Otrexup 20mg /0.30ml Inj   Other medication(s) to be shipped: Folic Acid 1mg      Madison Harris, DOB: 1955-12-19  Phone: [phone] (home)     All above HIPAA information was verified with patient.     Was a Nurse, learning disability used for this call? No    Completed refill call assessment today to schedule patient's medication shipment from the [facility] [phone]).  All relevant notes have been reviewed.     Specialty medication(s) and dose(s) confirmed: Regimen is correct and unchanged.   Changes to medications: Wilmarie reports no changes at this time.  Changes to insurance: No  New side effects reported not previously addressed with a pharmacist or physician: None reported  Questions for the pharmacist: No    Confirmed patient received a Conservation officer, historic buildings and a Surveyor, mining with first shipment. The patient will receive a drug information handout for each medication shipped and additional FDA Medication Guides as required.       DISEASE/MEDICATION-SPECIFIC INFORMATION        For patients on injectable medications: Patient currently has 1 doses left.  Next injection is scheduled for 08/16/2021.    SPECIALTY MEDICATION ADHERENCE     Medication Adherence    Patient reported X missed doses in the last month: 0  Specialty Medication: Otrexup 20mg /0.20ml Inj  Patient is on additional specialty medications: No  Patient is on more than two specialty medications: No  Informant: patient  Reliability of informant: reliable        Were doses missed due to medication being on hold? No    Otrexup 20mg /0.43ml Inj : 4 days of medicine on hand     REFERRAL TO PHARMACIST     Referral to the pharmacist: Not needed    [facility]     Shipping address confirmed in Epic.     Delivery Scheduled: Yes, Expected medication delivery date: 08/18/2021.     Medication will be delivered via UPS to the prescription address in Epic WAM.    Desirea Mizrahi [facility] Specialty Technician

## 2021-09-17 NOTE — Unmapped (Signed)
The [facility] has made a second and final attempt to reach this patient to refill the following medication:repatha.      We have left voicemails on the following phone numbers: [phone] and have sent a MyChart message.    Dates contacted: 3/22, 3/31  Last scheduled delivery: 1/5    The patient may be at risk of non-compliance with this medication. The patient should call the [facility] at [phone]  Option 4, then Option 2 (all other specialty patients) to refill medication.    Olga Millers   [facility] Specialty Technician

## 2021-09-27 NOTE — Unmapped (Signed)
Cardiology Follow up visit      Requesting Provider: Virgilio Frees*   Primary Provider: Doroteo Bradford, MD     Reason for Consult:   Routine follow up     Assessment & Plan:  1. 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   -Continue PCSK9 inhibitor  -Continue ASA daily  -Carvedilol 12.5 mg BID with plan to titrate to 25 mg BID as this could aid with glycemic and BP control  -Continue imdur 30, she does not want to increase the dose  -close follow-up in 3 months to assess symptom trajectory  -ER for any new symptoms not relieved by NTG    2. HTN: A bit above control range   -Continue lisinopril 40  -Home monitoring, BP diary log, f/u with PCP    Elpidio Anis  MD [facility]  Division of Cardiology   [place] [facility] [phone]  Pager [phone]    History of Present Illness:  Madison Harris is a 66 y.o. female with a history of coronary artery disease, who has been referred for evaluation of CAD.    She first presented to cardiology attention in 2012 at which point she had an anterior MI at [facility].  She underwent a DES and a staged stent to an LPL branch.  Since then she had done well until March of 2018 when she presented with chest pain to Dr Clifton James for chest discomfort.  She underwent PCI to her OM3 with symptom resolution.  She has comorbid DM, HTN, HLD with statin intolerance, now on evolocumab with excellent LDL response.  She had severe myalgias on multiple statins.  She also has RA and was started on MTX a couple months ago and is actively titrating down on prednisone, now 9mg  daily.  She has had to switch to [facility] for insurance purposes and is here for a routine follow up.    Echo 09/14/2016 Normal LV/RV valvular function.    Madison Harris returns for follow-up in clinic today.  Having angina and has used SL NTG at home due to heavy chest pressure.  It was relieved within a few minutes.  She has had multiple episodes since that time including pain into her jaw and back over the last few weeks.     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  She exercises one or twice weekly by walking or horseback riding without difficulty.  She is not overly concerned about these new symptoms and thinks they are mild in nature, would prefer to avoid additional testing at this time.     Social History:  Social History     Socioeconomic History    Marital status: Married   Occupational History    Occupation: paralegal   Tobacco Use    Smoking status: Never    Smokeless tobacco: Never   Vaping Use    Vaping Use: Never used   Substance and Sexual Activity    Alcohol use: Yes     Comment: 2 per night    Drug use: Never       Family History:  Family History   Problem Relation Age of Onset    Cancer Mother     Cancer Brother     Cancer Maternal Grandmother     Diabetes  Maternal Grandmother     Heart disease Maternal Grandmother     Hypertension Maternal Grandmother     Ovarian cancer Neg Hx     Breast cancer Neg Hx        Review of Systems:  A 12-system review of systems was performed and was negative except as noted in the HPI.    Physical Exam:  VITAL SIGNS:   Vitals:    09/30/21 1124   BP: 154/86   BP Site: L Arm   BP Position: Sitting   BP Cuff Size: Large   Pulse: 67   Resp: 18   Temp: 36.5 ??C (97.7 ??F)   TempSrc: Temporal   SpO2: 97%   Weight: 68.9 kg (151 lb 12.8 oz)   Height: 154.9 cm (5' 1)     Body mass index is 28.68 kg/m??.  GENERAL: Well appearing NAD   HEENT: PERRL, EOMI, OP clear with MMM.   NECK: Supple without LAD, TM, JVD, or HJR.  RESPIRATORY: CTA bilaterally without wheezes or crackles.  Normal WOB.  CARDIOVASCULAR: RRR without m/r/g.  GASTROINTESTINAL: NABS present.  Soft, NT/ND without HSM.MUSCULOSKELETAL  / EXTREMITIES:  No LE edema.  2+ radial, PT, and DP pulses bilaterally            Scribe Attestation: This document serves as a record of the services and decisions performed by Elpidio Anis, MD on 09/30/2021. It was created on his behalf by Michaele Offer, a trained medical scribe. The creation of this document is based on the provider's statements and observations that were conveyed to the medical scribe during the patient's encounter.     (The information in this document, created by the medical scribe for me, accurately reflects the services I personally performed and the decisions made by me. I have reviewed and approved this document for accuracy.)     Elpidio Anis  MD [facility]  Division of Cardiology   [place] of [facility]   Office [phone]   Pager [phone]

## 2021-09-30 ENCOUNTER — Ambulatory Visit
Admit: 2021-09-30 | Discharge: 2021-10-01 | Payer: PRIVATE HEALTH INSURANCE | Attending: Cardiovascular Disease | Primary: Cardiovascular Disease

## 2021-09-30 DIAGNOSIS — I2510 Atherosclerotic heart disease of native coronary artery without angina pectoris: Principal | ICD-10-CM

## 2021-10-04 DIAGNOSIS — I10 Essential (primary) hypertension: Principal | ICD-10-CM

## 2021-10-04 DIAGNOSIS — I2510 Atherosclerotic heart disease of native coronary artery without angina pectoris: Principal | ICD-10-CM

## 2021-10-04 MED ORDER — REPATHA SURECLICK 140 MG/ML SUBCUTANEOUS PEN INJECTOR
SUBCUTANEOUS | 0 refills | 84 days | Status: CP
Start: 2021-10-04 — End: ?
  Filled 2021-10-07: qty 6, 84d supply, fill #0

## 2021-10-04 MED ORDER — ISOSORBIDE MONONITRATE ER 30 MG TABLET,EXTENDED RELEASE 24 HR
ORAL_TABLET | Freq: Every day | ORAL | 3 refills | 90 days | Status: CP
Start: 2021-10-04 — End: ?
  Filled 2021-10-07: qty 90, 90d supply, fill #0

## 2021-10-04 NOTE — Unmapped (Signed)
[facility] Refill Coordination Note    Specialty Medication(s) to be Shipped:   General Specialty: Repatha    Other medication(s) to be shipped: prednisone, isosorbide and escitalopram     Madison Harris, DOB: [birth year]  Phone: [phone] (home)       All above HIPAA information was verified with patient.     Was a Nurse, learning disability used for this call? No    Completed refill call assessment today to schedule patient's medication shipment from the [facility] [phone]).  All relevant notes have been reviewed.     Specialty medication(s) and dose(s) confirmed: Regimen is correct and unchanged.   Changes to medications: Madison Harris reports no changes at this time.  Changes to insurance: No  New side effects reported not previously addressed with a pharmacist or physician: None reported  Questions for the pharmacist: No    Confirmed patient received a Conservation officer, historic buildings and a Surveyor, mining with first shipment. The patient will receive a drug information handout for each medication shipped and additional FDA Medication Guides as required.       DISEASE/MEDICATION-SPECIFIC INFORMATION        For patients on injectable medications: Patient currently has 1 doses left.  Next injection is scheduled for 10/04/2021.    SPECIALTY MEDICATION ADHERENCE     Medication Adherence    Patient reported X missed doses in the last month: 1  Specialty Medication: Repatha  Patient is on additional specialty medications: No        Were doses missed due to medication being on hold? No    REFERRAL TO PHARMACIST     Referral to the pharmacist: Not needed      [facility]     Shipping address confirmed in Epic.     Delivery Scheduled: Yes, Expected medication delivery date: 10/08/2021.     Medication will be delivered via UPS to the prescription address in Epic WAM.    Madison Harris [facility] Specialty Technician

## 2021-10-04 NOTE — Unmapped (Signed)
[facility] RHEUMATOLOGY CLINIC - PHARMACIST NOTES    The [facility] has not been able to reach patient for refill of Repatha.  Last shipment went out on 06/24/21 for a 3 month supply.  Attempted to check in patient today.  Unable to get in touch with patient, left voicemail requesting call back to [facility] at [phone], option 4.     Rosanne Ashing, PharmD Candidate

## 2021-10-07 MED FILL — ESCITALOPRAM 20 MG TABLET: ORAL | 90 days supply | Qty: 90 | Fill #3

## 2021-10-07 MED FILL — PREDNISONE 1 MG TABLET: ORAL | 30 days supply | Qty: 120 | Fill #1

## 2021-10-21 ENCOUNTER — Ambulatory Visit: Admit: 2021-10-21 | Discharge: 2021-10-22 | Payer: PRIVATE HEALTH INSURANCE

## 2021-10-21 DIAGNOSIS — M810 Age-related osteoporosis without current pathological fracture: Principal | ICD-10-CM

## 2021-10-21 DIAGNOSIS — M353 Polymyalgia rheumatica: Principal | ICD-10-CM

## 2021-10-21 DIAGNOSIS — M0600 Rheumatoid arthritis without rheumatoid factor, unspecified site: Principal | ICD-10-CM

## 2021-10-21 DIAGNOSIS — M8580 Other specified disorders of bone density and structure, unspecified site: Principal | ICD-10-CM

## 2021-10-21 DIAGNOSIS — Z79631 Methotrexate, long term, current use: Principal | ICD-10-CM

## 2021-10-21 DIAGNOSIS — E559 Vitamin D deficiency, unspecified: Principal | ICD-10-CM

## 2021-10-21 LAB — C-REACTIVE PROTEIN: C-REACTIVE PROTEIN: <4 mg/L (ref <=10.0)

## 2021-10-21 LAB — CALCIUM: CALCIUM: 9.7 mg/dL (ref 8.7–10.4)

## 2021-10-21 LAB — CBC W/ AUTO DIFF
BASOPHILS ABSOLUTE COUNT: 0 10*9/L (ref 0.0–0.1)
BASOPHILS RELATIVE PERCENT: 1.1 %
EOSINOPHILS ABSOLUTE COUNT: 0.1 10*9/L (ref 0.0–0.5)
EOSINOPHILS RELATIVE PERCENT: 2.1 %
HEMATOCRIT: 43.4 % (ref 34.0–44.0)
HEMOGLOBIN: 14.7 g/dL (ref 11.3–14.9)
LYMPHOCYTES ABSOLUTE COUNT: 0.7 10*9/L — ABNORMAL LOW (ref 1.1–3.6)
LYMPHOCYTES RELATIVE PERCENT: 19.5 %
MEAN CORPUSCULAR HEMOGLOBIN CONC: 34 g/dL (ref 32.0–36.0)
MEAN CORPUSCULAR HEMOGLOBIN: 34.3 pg — ABNORMAL HIGH (ref 25.9–32.4)
MEAN CORPUSCULAR VOLUME: 101 fL — ABNORMAL HIGH (ref 77.6–95.7)
MEAN PLATELET VOLUME: 8.7 fL (ref 6.8–10.7)
MONOCYTES ABSOLUTE COUNT: 0.2 10*9/L — ABNORMAL LOW (ref 0.3–0.8)
MONOCYTES RELATIVE PERCENT: 4.8 %
NEUTROPHILS ABSOLUTE COUNT: 2.8 10*9/L (ref 1.8–7.8)
NEUTROPHILS RELATIVE PERCENT: 72.5 %
NUCLEATED RED BLOOD CELLS: 0 /100{WBCs} (ref <=4)
PLATELET COUNT: 182 10*9/L (ref 150–450)
RED BLOOD CELL COUNT: 4.29 10*12/L (ref 3.95–5.13)
RED CELL DISTRIBUTION WIDTH: 13.5 % (ref 12.2–15.2)
WBC ADJUSTED: 3.8 10*9/L (ref 3.6–11.2)

## 2021-10-21 LAB — SEDIMENTATION RATE: ERYTHROCYTE SEDIMENTATION RATE: <1 mm/h (ref 0–30)

## 2021-10-21 LAB — ALBUMIN: ALBUMIN: 4.2 g/dL (ref 3.4–5.0)

## 2021-10-21 LAB — AST: AST (SGOT): 22 U/L (ref <=34)

## 2021-10-21 LAB — CREATININE
CREATININE: 0.78 mg/dL
EGFR CKD-EPI (2021) FEMALE: 85 mL/min/{1.73_m2} (ref >=60)

## 2021-10-21 LAB — ALT: ALT (SGPT): 25 U/L (ref 10–49)

## 2021-10-21 NOTE — Unmapped (Signed)
Can take tylenol 1000 mg (two of the 500 mg pills) up to three times daily.     Continue tapering prednisone by 1 mg monthly:  On June 1, start 5 mg daily  On July 1, start 4 mg daily  On Aug 1, Start 3 mg daily  On Sept 1, start 2 mg daily  On Oct 1, start 1 mg daily   On Nov 1, start 1 mg every other day  On Dec 1, stop

## 2021-10-21 NOTE — Unmapped (Signed)
Rheumatology return visit     REASON FOR VISIT: f/u PMR    HISTORY: [name] is a 66 y.o. 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  Initially diagnosed with PMR by outside rheumatologist, severe stiffness in hips and neck. 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 Started SQ mtx (otrexup) in 04/2018. Transferred care to our clinic in 09/2018 due to insurance. Mtx increased to 20 mg at that visit. Repeat RF and CCP were negative. Suspect that underlying diagnosis is PMR.  Treatment history:  - Prednisone taper  - Difficulty tapering prednisone, so mtx (otrexup) added 04/2018  - Transferred care to [facility] rheumatology in 09/2018  - Worsening pain again after tapering to 3 mg, increased back to 7.5 mg in 10/2019  - She then reported increasing pain and transient HA, pain with chewing. Recommended further increase in prednisone, but pt wished to remain on 7.5 mg.   - Hip XR in 10/2019 revealed ?mass over sacrum, but MRI f/u revealed no sacral mass.   - Worsening HAs, though not clearly GCA, increased pred to 10 mg in 11/2019 with plans to start actemra, delayed due to [place] out of state.   - Attempted taper from 10 mg pred in 05/2020, but pt unable to tolerate tapering below 7 mg.   - Recommended adding actemra in 11/2020, delay due to insurance, started 06/2021  - Current medication regimen: mtx sq (otrexup) 20 mg qwk, FA 1 mg qd, actemra 162 mg q 14 days, prednisone 6 mg    Interim history:   Presents today for f/u.     She is currently taking 6 mg prednisone, has been at this dose for about 2 weeks.  She has had difficulty tapering prednisone still due to worsening achiness all over, particularly in the hips, arms, shoulders.  Intensity is similar to when she was taking 7 mg prednisone, but it is more constant at this lower dose.  Aching was so bad last week that she took Aleve, though she knows her cardiologist has recommended against NSAID use.  She finds Aleve to be more helpful than Tylenol, though has not tried Tylenol for these pains in long time.  Morning stiffness is minimal, lasting less than a minute.    Walking for exercise, usually 3 days weekly. Usually for 30 min. Last week walked 3 miles, but she paid for that and felt more sore.    No fevers, chills.   HAs on the top of the head intermittently x 1 mo.  No changes in vision, no jaw claudication, no scalp tendernesss.         CURRENT MEDICATIONS:  Current Outpatient Medications   Medication Sig Dispense Refill    aspirin (ECOTRIN) 81 MG tablet Take 1 tablet (81 mg total) by mouth.      blood sugar diagnostic (ACCU-CHEK GUIDE TEST STRIPS) Strp Use to check blood sugar once daily (fasting) 100 each 3    blood-glucose meter Misc Use once daily to check blood sugar as directed by your physician 1 each 0    carvediloL (COREG) 12.5 MG tablet Take 1 tablet (12.5 mg total) by mouth 2 (two) times a day with meals. 180 tablet 3    dapagliflozin (FARXIGA) 10 mg Tab tablet Take 1 tablet (10 mg total) by mouth every morning. 90 tablet 3    ergocalciferol, vitamin D2, (VITAMIN D2 ORAL) Take by mouth.      escitalopram oxalate (LEXAPRO)  20 MG tablet Take 1 tablet (20 mg total) by mouth in the morning. 90 tablet 4    evolocumab (REPATHA SURECLICK) 140 mg/mL PnIj Inject the contents of 1 pen (140mg ) under the skin every 14 days. Appointment needed for future refills. 6 mL 0    folic acid (FOLVITE) 1 MG tablet Take 2 tablets (2 mg total) by mouth daily. 180 tablet 3    isosorbide mononitrate (IMDUR) 30 MG 24 hr tablet Take 1 tablet (30 mg total) by mouth daily. 90 tablet 3    lancets Misc Use as directed 200 each 2    lancets Misc Use to check blood sugar once daily (fasting) 100 each 4    methotrexate, PF, (OTREXUP, PF,) 20 mg/0.4 mL AtIn Inject the contents of 1 pen (20mg ) under the skin every 7 days 4.8 mL 3    nitroglycerin (NITROSTAT) 0.4 MG SL tablet PLACE 1 TABLET UNDER THE TONGUE EVERY 5 MINUTES AS NEEDED FOR CHEST PAIN UP TO 3 DOSES THEN CALL 911 IF NO RELIEF      olmesartan (BENICAR) 40 MG tablet Take 1 tablet (40 mg total) by mouth daily. 90 tablet 4    pen needle, diabetic 31 gauge x 5/16 (8 mm) Ndle Use 1-3 times daily as directed. 100 each 3    predniSONE (DELTASONE) 1 MG tablet Take 1-4 tablets (1-4 mg total) by mouth daily. Take as directed for taper 540 tablet 1    predniSONE (DELTASONE) 5 MG tablet Take 1 tablet (5 mg total) by mouth daily. Take with 1mg  tablets as directed for taper. 90 tablet 1    safety needles 25 x 5/8  Ndle Use once a week for 4 weeks for b12 injections then once a month after 4 weeks 100 each 3    semaglutide (OZEMPIC) 1 mg/dose (4 mg/3 mL) PnIj injection Inject 1 mg under the skin every seven (7) days. 9 mL 1    syringe with needle (BD LUER-LOK SYRINGE) 3 mL 25 gauge x 1 Syrg Use once a week for 4 weeks for b12 injections then once a month after 4 weeks 100 each 2    tocilizumab (ACTEMRA ACTPEN) 162 mg/0.9 mL PnIj Inject 162 mg under the skin every fourteen (14) days. 5.4 mL 3    blood-glucose meter kit Check blood sugar daily. 1 each 0     No current facility-administered medications for this visit.       Past Medical History:   Diagnosis Date    Cataract     Diabetes mellitus (CMS-HCC)     Type 2     Hyperlipidemia     Hypertension         Record Review: Available records were reviewed, including pertinent office visits, labs, and imaging.      REVIEW OF SYSTEMS: Ten system were reviewed and negative except as noted above.    PHYSICAL EXAM:  VITAL SIGNS:   Vitals:    10/21/21 1027   BP: 81/51   BP Site: R Arm   BP Position: Sitting   BP Cuff Size: Large   Pulse: 75   Temp: 36.6 ??C (97.8 ??F)   TempSrc: Temporal   Weight: 67.2 kg (148 lb 3.2 oz)     General:   Pleasant 66 y.o.female in no acute distress, WDWN   Cardiovascular:  Regular rate and rhythm. No murmur, rub, or gallop. No lower extremity edema.    Lungs:  Clear to auscultation.Normal respiratory effort.    Musculoskeletal:  General: Ambulates w/o assistance.  Able to rise from a seated position without the use of hands.  2/18 tender points positive.  Hands: No swelling or tenderness.  Able to make a tight fist.  Wrists: Reduced extension of the left  Elbows: FROM w/o swelling or tenderness   Shoulders: Slight stiffness in range of motion with pain  Hips: Good range of motion bilaterally without tenderness.   Knees: FROM w/o effusions   Ankles: No swelling or tenderness   Feet: No pain with MTP squeeze   Psych:  Appropriate affect and mood   Skin:  No rashes.          ASSESSMENT/PLAN:    1.  Seronegative RA/PMR  No synovitis on exam, and overall history is still unclear for active PMR.  It is unclear to me that the Actemra has provided any benefit, we will continue this for now, but may consider discontinuing in the future.  She continues to struggle tapering prednisone, though given normal inflammatory markers and minimal morning stiffness, suspect that her pain is likely coming from other etiologies.  Recommend Tylenol 1000 mg 3 times daily as needed for pain.    Recommend continued tapering of prednisone by 1 mg monthly, taper instructions printed for patient.    2. Methotrexate, long term, current use  Checking labs below to evaluate for medication toxicity.    - Albumin  - ALT  - AST  - CBC w/ Differential  - Creatinine    3.  Osteopenia  Noted on DEXA 11/21/2019, T score L spine +0.5, femur neck -1.6. FRAX score showing 10 yr probability of 13% for of major osteoporotic fracture and a 1.3% for hip fracture.  Thus will not start antiresorptive therapy at this time.    Recommend resuming calcium and vitamin D supplements.   Order for updated bone density placed.          HCM:   -Pneumonia vaccine: Declines  - COVID-19 vaccine status:Moderna 08/16/2019, 09/13/2019, 05/19/2020, 08/27/2020. Declines bivalent booster   - Annual Influenza vaccine. Status: UTD per pt  - Bone health: See note on osteopenia above  -Contraception: Hysterectomy            Greater than 30 minutes spent in visit with patient, including pre and postvisit activities.  Return in 3 mo with myself and 6 mo with Dr Scarlette Calico

## 2021-10-25 NOTE — Unmapped (Signed)
Patient complaining of fluctuating blood pressure acompanied with headache and chest pressure that's ben going on for the past 3 weeks. She already address the issue with Dr. Andrey Farmer on her last clinic visit 09/30/2021. She already have a return appt.scheduled 3 months from today but she is requesting to be seen sooner.

## 2021-10-26 LAB — VITAMIN D 25 HYDROXY: VITAMIN D, TOTAL (25OH): 33.6 ng/mL (ref 20.0–80.0)

## 2021-10-28 ENCOUNTER — Ambulatory Visit
Admit: 2021-10-28 | Discharge: 2021-10-29 | Payer: PRIVATE HEALTH INSURANCE | Attending: Cardiovascular Disease | Primary: Cardiovascular Disease

## 2021-10-28 NOTE — Unmapped (Signed)
Cardiology Follow up visit      Requesting Provider: Virgilio Frees*   Primary Provider: Doroteo Bradford, MD     Reason for Consult:   Routine follow up     Assessment & Plan:  1. Coronary artery disease, stable angina   2. Hyperlipidemia    -h/o MI without angina s/p 3 DES 2012 LAD, LPDA 08/2016 OM3, preserved LVEF, statin intolerance  - excellent lipid control on PCSK9 inhibitor.   -Continue ASA daily  -Carvedilol 12.5 mg BID with plan to titrate to 25 mg BID as this could aid with glycemic and BP control  -Discontinue imdur, given absence of angina symptoms, expected to improve fatigue   -ER for any new symptoms not relieved by NTG  -OK to start exercise regimen    2. HTN:    -Continue olmesartan 40  -Slightly elevated at home, with fluctuating readings    3. Autonomic Dysfunction  - May attribute to her fatigue    4. OSA  - Not compliant with CPAP, due to discomfort    Elpidio Anis  MD [facility]  Division of Cardiology   [place] [facility] [phone]    History of Present Illness:  Madison Harris is a 66 y.o. female with a history of coronary artery disease, who has been referred for evaluation of CAD.    She first presented to cardiology attention in 2012 at which point she had an anterior MI at [facility].  She underwent a DES and a staged stent to an LPL branch.  Since then she had done well until March of 2018 when she presented with chest pain to Dr Clifton James for chest discomfort.  She underwent PCI to her OM3 with symptom resolution.  She has comorbid DM, HTN, HLD with statin intolerance, now on evolocumab with excellent LDL response.  She had severe myalgias on multiple statins.  She also has RA and was started on MTX a couple months ago and is actively titrating down on prednisone, now 9mg  daily.  She has had to switch to [facility] for insurance purposes and is here for a routine follow up.    Echo 09/14/2016 Normal LV/RV valvular function.    Madison Harris returns for follow-up in clinic today. She notes that her BP's at home fluctuate a lot and states that she feels tired and disoriented when she notices these swings. Her BP log has more high SBP's than low. She has had diabetes for around 15 years.  Her symptoms of angina seem to have improved and has not used SL NTG since last visit.  No pnd/orthopnea or edema.      Social History:  Social History     Socioeconomic History    Marital status: Married   Occupational History    Occupation: paralegal   Tobacco Use    Smoking status: Never    Smokeless tobacco: Never   Vaping Use    Vaping Use: Never used   Substance and Sexual Activity    Alcohol use: Yes     Comment: 2 per night    Drug use: Never       Family History:  Family History   Problem Relation Age of Onset    Cancer Mother     Cancer Brother     Cancer Maternal Grandmother     Diabetes Maternal Grandmother     Heart disease Maternal Grandmother  Hypertension Maternal Grandmother     Ovarian cancer Neg Hx     Breast cancer Neg Hx        Review of Systems:  A 12-system review of systems was performed and was negative except as noted in the HPI.    Physical Exam:  VITAL SIGNS:   Vitals:    10/28/21 1310 10/28/21 1312   BP: 107/59 114/61   BP Site: L Arm L Arm   BP Position: Sitting Sitting   BP Cuff Size: Medium Medium   Pulse: 71 71   SpO2: 97%    Weight: 68.2 kg (150 lb 6.4 oz)    Height: 154.9 cm (5' 1)      Body mass index is 28.42 kg/m??.  GENERAL: Well appearing NAD   HEENT: PERRL, EOMI, OP clear with MMM.   NECK: Supple without LAD, TM, JVD, or HJR.  RESPIRATORY: CTA bilaterally without wheezes or crackles.  Normal WOB.  CARDIOVASCULAR: RRR without m/r/g.  GASTROINTESTINAL: NABS present.  Soft, NT/ND without HSM.MUSCULOSKELETAL  / EXTREMITIES:  No LE edema.  2+ radial, PT, and DP pulses bilaterally            Scribe Attestation:         This document serves as a record of the services and decisions performed by Elpidio Anis, MD on 10/28/2021. It was created on his behalf by Michaele Offer, a trained medical scribe. The creation of this document is based on the provider's statements and observations that were conveyed to the medical scribe during the patient's encounter.     (The information in this document, created by the medical scribe for me, accurately reflects the services I personally performed and the decisions made by me. I have reviewed and approved this document for accuracy.)     Elpidio Anis  MD [facility]  Division of Cardiology   [place] of [facility]   Office [phone]   Pager [phone]

## 2021-11-25 NOTE — Unmapped (Signed)
[facility] Refill Coordination Note    Specialty Medication(s) to be Shipped:   Inflammatory Disorders: Otrexup    Other medication(s) to be shipped: No additional medications requested for fill at this time     Madison Harris, DOB: 04-Aug-1955  Phone: [phone] (home)       All above HIPAA information was verified with patient.     Was a Nurse, learning disability used for this call? No    Completed refill call assessment today to schedule patient's medication shipment from the [facility] [phone]).  All relevant notes have been reviewed.     Specialty medication(s) and dose(s) confirmed: Regimen is correct and unchanged.   Changes to medications: Ajiah reports no changes at this time.  Changes to insurance: No  New side effects reported not previously addressed with a pharmacist or physician: None reported  Questions for the pharmacist: No    Confirmed patient received a Conservation officer, historic buildings and a Surveyor, mining with first shipment. The patient will receive a drug information handout for each medication shipped and additional FDA Medication Guides as required.       DISEASE/MEDICATION-SPECIFIC INFORMATION        For patients on injectable medications: Patient currently has 0 doses left.  Next injection is scheduled for 6/13.    SPECIALTY MEDICATION ADHERENCE     Medication Adherence    Patient reported X missed doses in the last month: 0  Specialty Medication: Otrexup              Were doses missed due to medication being on hold? No        REFERRAL TO PHARMACIST     Referral to the pharmacist: Not needed      [facility]     Shipping address confirmed in Epic.     Delivery Scheduled: Yes, Expected medication delivery date: 6/13.     Medication will be delivered via UPS to the temporary address in Epic WAM.    Madison Harris   [id number] Specialty Technician

## 2021-11-29 MED FILL — OTREXUP (PF) 20 MG/0.4 ML SUBCUTANEOUS AUTO-INJECTOR: SUBCUTANEOUS | 28 days supply | Qty: 1.6 | Fill #1

## 2021-12-23 DIAGNOSIS — I2510 Atherosclerotic heart disease of native coronary artery without angina pectoris: Principal | ICD-10-CM

## 2021-12-23 MED ORDER — ESCITALOPRAM 20 MG TABLET
ORAL_TABLET | Freq: Every day | ORAL | 4 refills | 90 days | Status: CP
Start: 2021-12-23 — End: ?
  Filled 2021-12-27: qty 90, 90d supply, fill #0

## 2021-12-23 MED ORDER — OLMESARTAN 40 MG TABLET
ORAL_TABLET | Freq: Every day | ORAL | 4 refills | 90 days | Status: CP
Start: 2021-12-23 — End: 2022-12-23
  Filled 2021-12-27: qty 90, 90d supply, fill #0

## 2021-12-23 MED ORDER — CARVEDILOL 12.5 MG TABLET
ORAL_TABLET | Freq: Two times a day (BID) | ORAL | 1 refills | 90 days | Status: CP
Start: 2021-12-23 — End: 2022-06-21
  Filled 2021-12-27: qty 180, 90d supply, fill #0

## 2021-12-23 MED ORDER — REPATHA SURECLICK 140 MG/ML SUBCUTANEOUS PEN INJECTOR
SUBCUTANEOUS | 0 refills | 84.00000 days | Status: CN
Start: 2021-12-23 — End: ?

## 2021-12-23 NOTE — Unmapped (Signed)
[facility] Clinical Assessment & Refill Coordination Note    Madison Harris, [place]: 25-Mar-1956  Phone: [phone] (home)     All above HIPAA information was verified with patient.     Was a Nurse, learning disability used for this call? No    Specialty Medication(s):   Inflammatory Disorders: Otrexup and General Specialty: Repatha     Current Outpatient Medications   Medication Sig Dispense Refill   ??? aspirin (ECOTRIN) 81 MG tablet Take 1 tablet (81 mg total) by mouth.     ??? blood sugar diagnostic (ACCU-CHEK GUIDE TEST STRIPS) Strp Use to check blood sugar once daily (fasting) 100 each 3   ??? blood-glucose meter kit Check blood sugar daily. 1 each 0   ??? blood-glucose meter Misc Use once daily to check blood sugar as directed by your physician 1 each 0   ??? carvediloL (COREG) 12.5 MG tablet Take 1 tablet (12.5 mg total) by mouth 2 (two) times a day with meals. 180 tablet 3   ??? dapagliflozin (FARXIGA) 10 mg Tab tablet Take 1 tablet (10 mg total) by mouth every morning. 90 tablet 3   ??? ergocalciferol, vitamin D2, (VITAMIN D2 ORAL) Take by mouth. (Patient not taking: Reported on 10/28/2021)     ??? escitalopram oxalate (LEXAPRO) 20 MG tablet Take 1 tablet (20 mg total) by mouth in the morning. 90 tablet 4   ??? evolocumab (REPATHA SURECLICK) 140 mg/mL PnIj Inject the contents of 1 pen (140mg ) under the skin every 14 days. Appointment needed for future refills. 6 mL 0   ??? folic acid (FOLVITE) 1 MG tablet Take 2 tablets (2 mg total) by mouth daily. 180 tablet 3   ??? lancets Misc Use as directed 200 each 2   ??? lancets Misc Use to check blood sugar once daily (fasting) 100 each 4   ??? methotrexate, PF, (OTREXUP, PF,) 20 mg/0.4 mL AtIn Inject the contents of 1 pen (20mg ) under the skin every 7 days 4.8 mL 3   ??? nitroglycerin (NITROSTAT) 0.4 MG SL tablet PLACE 1 TABLET UNDER THE TONGUE EVERY 5 MINUTES AS NEEDED FOR CHEST PAIN UP TO 3 DOSES THEN CALL 911 IF NO RELIEF     ??? olmesartan (BENICAR) 40 MG tablet Take 1 tablet (40 mg total) by mouth daily. 90 tablet 4   ??? pen needle, diabetic 31 gauge x 5/16 (8 mm) Ndle Use 1-3 times daily as directed. 100 each 3   ??? predniSONE (DELTASONE) 1 MG tablet Take 1-4 tablets (1-4 mg total) by mouth daily. Take as directed for taper 540 tablet 1   ??? predniSONE (DELTASONE) 5 MG tablet Take 1 tablet (5 mg total) by mouth daily. Take with 1mg  tablets as directed for taper. 90 tablet 1   ??? safety needles 25 x 5/8  Ndle Use once a week for 4 weeks for b12 injections then once a month after 4 weeks 100 each 3   ??? semaglutide (OZEMPIC) 1 mg/dose (4 mg/3 mL) PnIj injection Inject 1 mg under the skin every seven (7) days. 9 mL 1   ??? syringe with needle (BD LUER-LOK SYRINGE) 3 mL 25 gauge x 1 Syrg Use once a week for 4 weeks for b12 injections then once a month after 4 weeks 100 each 2   ??? tocilizumab (ACTEMRA ACTPEN) 162 mg/0.9 mL PnIj Inject 162 mg under the skin every fourteen (14) days. 5.4 mL 3     No current facility-administered medications for this visit.  Changes to medications: [name] Reports stopping the following medications: imdur per 5/11 appt, discontinued. Carvedilol may have dose change, requested new RX    Allergies   Allergen Reactions   ??? Pravastatin Muscle Pain     Other Reaction: myalgia       Changes to allergies: No    SPECIALTY MEDICATION ADHERENCE     Repatha 140 mg/ml: 0 days of medicine on hand       Medication Adherence    Patient reported X missed doses in the last month: 0  Specialty Medication: Otrexup 20mg  q7d  Patient is on additional specialty medications: Yes  Additional Specialty Medications: Repatha   Patient Reported Additional Medication X Missed Doses in the Last Month: 0  Patient is on more than two specialty medications: No  Informant: patient          Specialty medication(s) dose(s) confirmed: Regimen is correct and unchanged.     Are there any concerns with adherence? No    Adherence counseling provided? Not needed    CLINICAL MANAGEMENT AND INTERVENTION      Clinical Benefit Assessment:    Do you feel the medicine is effective or helping your condition? Yes    Clinical Benefit counseling provided? Progress note from 5/11 shows evidence of clinical benefit    Adverse Effects Assessment:    Are you experiencing any side effects? No    Are you experiencing difficulty administering your medicine? No    Quality of Life Assessment:    Quality of Life    Rheumatology  1. What impact has your specialty medication had on the reduction of your daily pain level?: Tremendous  2. What impact has your specialty medication had on your ability to complete daily tasks (prepare meals, get dressed, etc...)?Marland Kitchen Tremendous  Oncology  Dermatology  Cystic Fibrosis          How many days over the past month did your condition  keep you from your normal activities? For example, brushing your teeth or getting up in the morning. Patient declined to answer    Have you discussed this with your provider? Not needed    Acute Infection Status:    Acute infections noted within Epic:  No active infections  Patient reported infection: None    Therapy Appropriateness:    Is therapy appropriate and patient progressing towards therapeutic goals? Yes, therapy is appropriate and should be continued    DISEASE/MEDICATION-SPECIFIC INFORMATION      For patients on injectable medications: Patient currently has 0 doses left.  Next injection is scheduled for 7/10 (Otrexup).    PATIENT SPECIFIC NEEDS     - Does the patient have any physical, cognitive, or cultural barriers? No    - Is the patient high risk? No    - Does the patient require a Care Management Plan? No     SOCIAL DETERMINANTS OF HEALTH     At the [facility], we have learned that life circumstances - like trouble affording food, housing, utilities, or transportation can affect the health of many of our patients.   That is why we wanted to ask: are you currently experiencing any life circumstances that are negatively impacting your health and/or quality of life? Patient declined to answer    Social Determinants of Health     Financial Resource Strain: Not on file   Internet Connectivity: Not on file   Food Insecurity: Not on file   Tobacco Use: Low Risk    ???  Smoking Tobacco Use: Never   ??? Smokeless Tobacco Use: Never   ??? Passive Exposure: Not on file   Housing/Utilities: Unknown   ??? Within the past 12 months, have you ever stayed: outside, in a car, in a tent, in an overnight shelter, or temporarily in someone else's home (i.e. couch-surfing)?: No   ??? Are you worried about losing your housing?: Not on file   ??? Within the past 12 months, have you been unable to get utilities (heat, electricity) when it was really needed?: Not on file   Alcohol Use: Not on file   Transportation Needs: Not on file   Substance Use: Low Risk    ??? Taken prescription drugs for non-medical reasons: Never   ??? Taken illegal drugs: Never   ??? Patient indicated they have taken drugs in the past year for non-medical reasons: Yes, [positive answer(s)]: Not on file   Health Literacy: Medium Risk   ??? : Rarely   Physical Activity: Not on file   Interpersonal Safety: Not on file   Stress: Not on file   Intimate Partner Violence: Not on file   Depression: Not on file   Social Connections: Not on file       Would you be willing to receive help with any of the needs that you have identified today? Not applicable       SHIPPING     Specialty Medication(s) to be Shipped:   Inflammatory Disorders: Otrexup, Repatha    Other medication(s) to be shipped: olmesarta, folic acid, prednisone 5, prednisone 1, carvedilol, lexapro     Changes to insurance: No    Delivery Scheduled: Yes, Expected medication delivery date: 7/11.     Medication will be delivered via UPS to the confirmed prescription address in [facility].    The patient will receive a drug information handout for each medication shipped and additional FDA Medication Guides as required.  Verified that patient has previously received a Conservation officer, historic buildings and a Surveyor, mining.    The patient or caregiver noted above participated in the development of this care plan and knows that they can request review of or adjustments to the care plan at any time.      All of the patient's questions and concerns have been addressed.    Julianne Rice   [facility] Specialty Pharmacist

## 2021-12-23 NOTE — Unmapped (Signed)
Unable to complete refill request, as patient is due for an appointment..   Please review below request.      Patient is requesting the following refill  Requested Prescriptions     Pending Prescriptions Disp Refills    escitalopram oxalate (LEXAPRO) 20 MG tablet 90 tablet 4     Sig: Take 1 tablet (20 mg total) by mouth in the morning.    olmesartan (BENICAR) 40 MG tablet 90 tablet 4     Sig: Take 1 tablet (40 mg total) by mouth daily.       Recent Visits  Date Type Provider Dept   04/26/21 Office Visit Livingston Diones, MD [facility] Internal Medicine   01/13/21 Office Visit Dalphine Handing, MD [facility] Internal Medicine   Showing recent visits within past 365 days with a meds authorizing provider and meeting all other requirements  Future Appointments  No visits were found meeting these conditions.  Showing future appointments within next 365 days with a meds authorizing provider and meeting all other requirements       Labs: Creatinine:   Creatinine (mg/dL)   Date Value   16/03/9603 0.78   07/25/2018 1.2    Potassium:   Potassium (mmol/L)   Date Value   04/26/2021 3.7   07/25/2018 4.5    Vitals:   BP Readings from Last 3 Encounters:   10/28/21 114/61   10/21/21 81/51   09/30/21 154/86    and   Pulse Readings from Last 3 Encounters:   10/28/21 71   10/21/21 75   09/30/21 67    PHQ9:        No data to display

## 2021-12-27 DIAGNOSIS — I2510 Atherosclerotic heart disease of native coronary artery without angina pectoris: Principal | ICD-10-CM

## 2021-12-27 MED FILL — PREDNISONE 5 MG TABLET: ORAL | 90 days supply | Qty: 90 | Fill #1

## 2021-12-27 MED FILL — FOLIC ACID 1 MG TABLET: ORAL | 90 days supply | Qty: 180 | Fill #1

## 2021-12-27 MED FILL — PREDNISONE 1 MG TABLET: ORAL | 30 days supply | Qty: 120 | Fill #2

## 2021-12-27 MED FILL — OTREXUP (PF) 20 MG/0.4 ML SUBCUTANEOUS AUTO-INJECTOR: SUBCUTANEOUS | 28 days supply | Qty: 1.6 | Fill #2

## 2021-12-27 NOTE — Unmapped (Signed)
Madison Harris 's Repatha shipment will be delayed as a result of no refills remain on the prescription.      I have reached out to the patient  at [phone] and left a voicemail message.  We will call the patient back to reschedule the delivery upon resolution. We have not confirmed the new delivery date.

## 2021-12-27 NOTE — Unmapped (Signed)
Refill request received for patient.      Medication Requested: Repatha  Last Office Visit: 10/28/2021   Next Office Visit: 01/20/2022  Last Prescriber: Andrey Farmer    Nurse refill requirements met? No  If not met, why: not approved medication for Care Partner to refill    Sent to: Provider for signing  If sent to provider, which provider?: Andrey Farmer

## 2021-12-28 DIAGNOSIS — I2510 Atherosclerotic heart disease of native coronary artery without angina pectoris: Principal | ICD-10-CM

## 2021-12-28 MED ORDER — REPATHA SURECLICK 140 MG/ML SUBCUTANEOUS PEN INJECTOR
SUBCUTANEOUS | 0 refills | 84 days | Status: CP
Start: 2021-12-28 — End: ?
  Filled 2022-01-06: qty 6, 84d supply, fill #0

## 2021-12-29 DIAGNOSIS — Z794 Long term (current) use of insulin: Principal | ICD-10-CM

## 2021-12-29 DIAGNOSIS — E1165 Type 2 diabetes mellitus with hyperglycemia: Principal | ICD-10-CM

## 2021-12-29 MED ORDER — DAPAGLIFLOZIN PROPANEDIOL 10 MG TABLET
ORAL_TABLET | Freq: Every morning | ORAL | 1 refills | 90 days | Status: CP
Start: 2021-12-29 — End: ?
  Filled 2022-01-06: qty 90, 90d supply, fill #0

## 2021-12-29 NOTE — Unmapped (Signed)
Patient is requesting the following medication(s) for refill: dapagliflozin (FARXIGA) 10 mg Tab tablet , and a quantity of 90.    Pharmacy name and address has been verified.      [facility]  [address] [place], [place] [address]  Phone: [phone]  Fax: [phone]           Patient has scheduled an appt for 9/27  her husband is in a rehab facility in [place]  can't do an appt til then.

## 2021-12-29 NOTE — Unmapped (Signed)
Addended by: Brain Hilts on: 12/29/2021 03:43 PM     Modules accepted: Orders

## 2021-12-29 NOTE — Unmapped (Signed)
Patient is requesting the following refill  Requested Prescriptions      No prescriptions requested or ordered in this encounter       Recent Visits  Date Type Provider Dept   04/26/21 Office Visit Livingston Diones, MD [facility] Internal Medicine   01/13/21 Office Visit Dalphine Handing, MD [facility] Internal Medicine   Showing recent visits within past 365 days with a meds authorizing provider and meeting all other requirements  Future Appointments  Date Type Provider Dept   03/16/22 Appointment Livingston Diones, MD [facility] Internal Medicine   Showing future appointments within next 365 days with a meds authorizing provider and meeting all other requirements       Labs:     Cr: 0.78 mg/dL    Z6X:   Hemoglobin W9U (%)   Date Value   04/26/2021 6.5 (H)   01/23/2019 9.0 (H)

## 2022-01-06 NOTE — Unmapped (Signed)
Madison Harris 's Repatha shipment will be sent out  as a result of prior authorization now approved.     I have reached out to the patient  at [phone] and communicated the delivery change. We will reschedule the medication for the delivery date that the patient agreed upon.  We have confirmed the delivery date as 7/21, via ups.

## 2022-01-24 NOTE — Unmapped (Signed)
[facility] Refill Coordination Note    Specialty Medication(s) to be Shipped:   Inflammatory Disorders: Otrexup    Other medication(s) to be shipped:  prednisone 1mg      Madison Harris, DOB: 01-18-1956  Phone: [phone] (home)       All above HIPAA information was verified with patient.     Was a Nurse, learning disability used for this call? No    Completed refill call assessment today to schedule patient's medication shipment from the [facility] [phone]).  All relevant notes have been reviewed.     Specialty medication(s) and dose(s) confirmed: Regimen is correct and unchanged.   Changes to medications: Talulah reports no changes at this time.  Changes to insurance: No  New side effects reported not previously addressed with a pharmacist or physician: None reported  Questions for the pharmacist: No    Confirmed patient received a Conservation officer, historic buildings and a Surveyor, mining with first shipment. The patient will receive a drug information handout for each medication shipped and additional FDA Medication Guides as required.       DISEASE/MEDICATION-SPECIFIC INFORMATION        For patients on injectable medications: Patient currently has 0 doses left.  Next injection is scheduled for 08/09-asap.    SPECIALTY MEDICATION ADHERENCE     Medication Adherence    Specialty Medication: OTREXUP                                Were doses missed due to medication being on hold? No    Otrexup 20/0.4 mg/ml: 0 days of medicine on hand       REFERRAL TO PHARMACIST     Referral to the pharmacist: Not needed      [facility]     Shipping address confirmed in Epic.     Delivery Scheduled: Yes, Expected medication delivery date: 08/09.     Medication will be delivered via UPS to the prescription address in Epic WAM.    Antonietta Barcelona   [facility] Specialty Technician

## 2022-01-25 MED FILL — OTREXUP (PF) 20 MG/0.4 ML SUBCUTANEOUS AUTO-INJECTOR: SUBCUTANEOUS | 28 days supply | Qty: 1.6 | Fill #3

## 2022-01-25 MED FILL — PREDNISONE 1 MG TABLET: ORAL | 30 days supply | Qty: 120 | Fill #3

## 2022-02-28 NOTE — Unmapped (Signed)
The [facility] has made a third and final attempt to reach this patient to refill the following medication:Otrexup.      We have left voicemails on the following phone numbers: [phone] and have sent a text message to the following phone numbers: [phone].    Dates contacted: 8/30,9/7, 9/11  Last scheduled delivery: 8/8    The patient may be at risk of non-compliance with this medication. The patient should call the [facility] at [phone]  Option 4, then Option 2 (all other specialty patients) to refill medication.    Olga Millers   [facility] Specialty Technician

## 2022-03-09 NOTE — Unmapped (Signed)
[facility] RHEUMATOLOGY CLINIC - PHARMACIST NOTES    [facility] has been unable to reach patient about refill of Otrexup. Last shipment was sent out on 01/25/22 for a 1 month supply. 02/11/22 mychart message from [facility] said for patient to call rheum clinic for rescheduling appointment. Left VM and provided [facility] contact information to schedule refill and rheumatology clinic contact information for scheduling appointment.    Waverly Ferrari  PharmD Candidate      I have reviewed the pharmacy intern's findings.     Karalee Height, PharmD CPP  Clinical Pharmacist

## 2022-03-15 DIAGNOSIS — M353 Polymyalgia rheumatica: Principal | ICD-10-CM

## 2022-03-15 DIAGNOSIS — I2510 Atherosclerotic heart disease of native coronary artery without angina pectoris: Principal | ICD-10-CM

## 2022-03-15 MED ORDER — PREDNISONE 5 MG TABLET
ORAL_TABLET | Freq: Every day | ORAL | 1 refills | 90 days
Start: 2022-03-15 — End: 2022-08-02

## 2022-03-15 MED ORDER — REPATHA SURECLICK 140 MG/ML SUBCUTANEOUS PEN INJECTOR
SUBCUTANEOUS | 0 refills | 84 days
Start: 2022-03-15 — End: ?

## 2022-03-15 NOTE — Unmapped (Signed)
[facility] Refill Coordination Note    Specialty Medication(s) to be Shipped:   Inflammatory Disorders: Otrexup    Other medication(s) to be shipped: No additional medications requested for fill at this time     Madison Harris, DOB: May 18, 1956  Phone: [phone] (home)       All above HIPAA information was verified with patient.     Was a Nurse, learning disability used for this call? No    Completed refill call assessment today to schedule patient's medication shipment from the [facility] [phone]).  All relevant notes have been reviewed.     Specialty medication(s) and dose(s) confirmed: Regimen is correct and unchanged.   Changes to medications: Laurien reports no changes at this time.  Changes to insurance: No  New side effects reported not previously addressed with a pharmacist or physician: None reported  Questions for the pharmacist: No    Confirmed patient received a Conservation officer, historic buildings and a Surveyor, mining with first shipment. The patient will receive a drug information handout for each medication shipped and additional FDA Medication Guides as required.       DISEASE/MEDICATION-SPECIFIC INFORMATION        For patients on injectable medications: Patient currently has 0 doses left.  Next injection is scheduled for 09/28-asap.    SPECIALTY MEDICATION ADHERENCE     Medication Adherence    Patient reported X missed doses in the last month: 0  Specialty Medication: oxtrexup 20mg /0.24ml  Patient is on additional specialty medications: No  Patient is on more than two specialty medications: No  Any gaps in refill history greater than 2 weeks in the last 3 months: no  Demonstrates understanding of importance of adherence: yes  Informant: patient  Reliability of informant: reliable  Provider-estimated medication adherence level: good  Patient is at risk for Non-Adherence: No  Reasons for non-adherence: no problems identified                  Confirmed plan for next specialty medication refill: delivery by pharmacy  Refills needed for supportive medications: not needed          Refill Coordination    Has the Patients' Contact Information Changed: No  Is the Shipping Address Different: No         Were doses missed due to medication being on hold? No    otrexup 20/0.4 mg/ml: 0 days of medicine on hand       REFERRAL TO PHARMACIST     Referral to the pharmacist: Not needed      [facility]     Shipping address confirmed in Epic.     Delivery Scheduled: Yes, Expected medication delivery date: 09/27.     Medication will be delivered via UPS to the prescription address in Epic WAM.    Antonietta Barcelona   [facility] Specialty Technician

## 2022-03-16 ENCOUNTER — Ambulatory Visit
Admit: 2022-03-16 | Discharge: 2022-03-17 | Payer: PRIVATE HEALTH INSURANCE | Attending: Internal Medicine | Primary: Internal Medicine

## 2022-03-16 DIAGNOSIS — M353 Polymyalgia rheumatica: Principal | ICD-10-CM

## 2022-03-16 DIAGNOSIS — Z794 Long term (current) use of insulin: Principal | ICD-10-CM

## 2022-03-16 DIAGNOSIS — E782 Mixed hyperlipidemia: Principal | ICD-10-CM

## 2022-03-16 DIAGNOSIS — I2510 Atherosclerotic heart disease of native coronary artery without angina pectoris: Principal | ICD-10-CM

## 2022-03-16 DIAGNOSIS — I10 Essential (primary) hypertension: Principal | ICD-10-CM

## 2022-03-16 DIAGNOSIS — E119 Type 2 diabetes mellitus without complications: Principal | ICD-10-CM

## 2022-03-16 DIAGNOSIS — Z7952 Long term (current) use of systemic steroids: Principal | ICD-10-CM

## 2022-03-16 DIAGNOSIS — F411 Generalized anxiety disorder: Principal | ICD-10-CM

## 2022-03-16 LAB — LIPID PANEL
CHOLESTEROL/HDL RATIO SCREEN: 2 (ref 0.0–4.5)
CHOLESTEROL: 149 mg/dL (ref 0–200)
HDL CHOLESTEROL: 74 mg/dL — ABNORMAL HIGH (ref 40–60)
LDL CHOLESTEROL CALCULATED: 51 mg/dL (ref 30–130)
NON-HDL CHOLESTEROL: 75 mg/dL
TRIGLYCERIDES: 119 mg/dL (ref <=150)
VLDL CHOLESTEROL CAL: 23.8 mg/dL — ABNORMAL LOW (ref 25–40)

## 2022-03-16 LAB — COMPREHENSIVE METABOLIC PANEL
ALBUMIN/GLOBULIN RATIO: 1.3 (ref 1.1–2.2)
ALBUMIN: 3.7 g/dL (ref 3.5–5.0)
ALKALINE PHOSPHATASE: 71 U/L (ref 50–136)
ALT (SGPT): 35 U/L (ref 12–78)
ANION GAP: 9 mmol/L
AST (SGOT): 20 U/L (ref 7–31)
BILIRUBIN TOTAL: 0.7 mg/dL (ref 0.0–1.5)
BLOOD UREA NITROGEN: 18 mg/dL (ref 5–26)
BUN / CREAT RATIO: 21 (ref 12–25)
CALCIUM: 9.2 mg/dL (ref 8.5–10.5)
CHLORIDE: 105 mmol/L (ref 95–110)
CO2: 26.6 mmol/L (ref 21.0–31.0)
CREATININE: 0.86 mg/dL (ref 0.50–1.50)
EGFR CKD-EPI (2021) FEMALE: 76 mL/min/{1.73_m2} (ref >=60)
GLOBULIN, TOTAL: 2.9 g/dL (ref 1.5–4.3)
GLUCOSE RANDOM: 208 mg/dL — ABNORMAL HIGH (ref 60–100)
POTASSIUM: 4.3 mmol/L (ref 3.5–5.1)
PROTEIN TOTAL: 6.6 g/dL (ref 6.4–8.3)
SODIUM: 141 mmol/L (ref 136–145)

## 2022-03-16 LAB — ALBUMIN / CREATININE URINE RATIO
ALBUMIN QUANT URINE: 6.2 mg/dL
ALBUMIN/CREATININE RATIO: 85.5 ug/mg — ABNORMAL HIGH (ref 0.0–30.0)
CREATININE, URINE: 72.5 mg/dL

## 2022-03-16 MED ORDER — SERTRALINE 100 MG TABLET
ORAL_TABLET | Freq: Every day | ORAL | 3 refills | 90 days | Status: CP
Start: 2022-03-16 — End: 2023-03-16
  Filled 2022-03-28: qty 90, 90d supply, fill #0

## 2022-03-16 MED FILL — OTREXUP (PF) 20 MG/0.4 ML SUBCUTANEOUS AUTO-INJECTOR: SUBCUTANEOUS | 28 days supply | Qty: 1.6 | Fill #4

## 2022-03-16 MED FILL — PREDNISONE 1 MG TABLET: ORAL | 30 days supply | Qty: 120 | Fill #4

## 2022-03-16 NOTE — Unmapped (Signed)
Prednisone refill  Last ov: 10/21/2021  Next ov: 03/23/2022

## 2022-03-16 NOTE — Unmapped (Signed)
[facility]  [address] [name]  [place] [address]  Ph: [phone]           ASSESSMENT & PLAN   Diagnoses and all orders for this visit:    CAD s/p PCI  Essential hypertension  Mixed hyperlipidemia  T2DM, controlled  Due for labs today.  No changes to medications.    -     Albumin/creatinine urine ratio; Future  -     Comprehensive Metabolic Panel; Future  -     Lipid Panel; Future  -     Hemoglobin A1c; Future    GAD (generalized anxiety disorder)  Discussed options.  Agreed to cross-titrate to Sertraline: 50mg  Sertraline + 10mg  Escitalopram for 5-7 days, then discontinue Escitalopram and increase Sertraline to 100mg  thereafter.  Follow-up with me via MyChart afer ~2 weeks on Sertraline 100mg .  Can increase further if needed and/or augment with Buspirone.  -     sertraline (ZOLOFT) 100 MG tablet; Take 1 tablet (100 mg total) by mouth daily.    Current chronic use of systemic steroids  PMR (polymyalgia rheumatica) (CMS-HCC)  Continue to work on weaning steroids.  I greatly appreciate input of Rheumatology and she will continue follow-up with them on regularly scheduled interval.      HPI     Chief Complaint   Patient presents with    Follow-up     Madison Harris is a 66 y.o. female in today for follow-up.  Last visit ~10 months ago.    Doing well, caring for husband with recent spinal cord injury.  No changes to meds.  She denies chest pain, SOB, DOE, orthopnea, PND, pre-syncope or syncope.       Pred 7mg  for PMR.  Stable on other meds.  Would like to change Lexapro, feels like not fully helping with anxiety with husband's recent spinal cord injury.    I have reviewed and updated the past medical, social, and family history today and updated them in the EMR.    EXAM & DATA   T   - P   - RR   - SpO2    BP   BP Readings from Last 3 Encounters:   10/28/21 114/61   10/21/21 81/51   09/30/21 154/86     Wt   Wt Readings from Last 3 Encounters:   10/28/21 68.2 kg (150 lb 6.4 oz)   10/21/21 67.2 kg (148 lb 3.2 oz)   09/30/21 68.9 kg (151 lb 12.8 oz)    -    Data Reviewed:  Lab Results   Component Value Date    NA 138 04/26/2021    K 3.7 04/26/2021    BUN 15 04/26/2021    CREATININE 0.78 10/21/2021    A1C 6.5 (H) 04/26/2021    CHOL 126 04/26/2021    LDL 53 04/26/2021    HDL 55 04/26/2021    TRIG 89 04/26/2021    ALT 25 10/21/2021     Lab Results   Component Value Date    A1C 6.5 (H) 04/26/2021    A1C 6.7 (H) 12/02/2020    A1C 6.7 (H) 11/02/2020    A1C 8.9 (H) 08/10/2020    A1C 8.0 (H) 04/06/2020    A1C 7.1 (H) 07/26/2019    A1C 7.8 (H) 04/25/2019    A1C 9.0 (H) 01/23/2019    A1C 7.9 (H) 11/14/2018    A1C 8.4 (H) 07/25/2018     Lab Results  Component Value Date    TRIG 89 04/26/2021    TRIG 130 11/02/2020    TRIG 87 08/10/2020    TRIG 144 04/06/2020    TRIG 120 07/26/2019    TRIG 131 07/25/2018     Lab Results   Component Value Date    LDL 53 04/26/2021    LDL 37 (L) 11/02/2020    LDL 46 08/10/2020    LDL 27 (L) 04/06/2020    LDL 21 07/26/2019    LDL 38.8 07/25/2018     Lab Results   Component Value Date    ALT 25 10/21/2021    ALT 19 07/15/2021    ALT 19 04/26/2021    ALT 21 12/02/2020    ALT 19 11/02/2020    ALT 23 08/10/2020    ALT 20 04/06/2020    ALT 14 11/06/2019    ALT 17 07/26/2019    ALT 18 04/25/2019     No results found for: APOB  No results found for: HSCRP  Lab Results   Component Value Date    ALBCRERAT 22.5 04/26/2021    ALBCRERAT 7.7 11/02/2020    ALBCRERAT  08/10/2020      Comment:      Unable to calculate due to value below lower limit of assay linearity.    ALBCRERAT 14.3 04/06/2020    ALBCRERAT 20.9 07/26/2019    ALBCRERAT 15.6 07/25/2018     Lab Results   Component Value Date    WBC 3.8 10/21/2021    HGB 14.7 10/21/2021    PLT 182 10/21/2021    MCV 101.0 (H) 10/21/2021    RDW 13.5 10/21/2021     No results found for: FERRITIN, TIBC, IRON  Lab Results   Component Value Date    TSH 0.430 10/03/2019     Lab Results   Component Value Date    VITDTOTAL 33.6 10/21/2021    VITDTOTAL 29.8 11/18/2020    VITDTOTAL 31.7 08/27/2020    VITDTOTAL 25.0 11/06/2019     No results found for: PSA    Pathology & Recent Imaging:  No results found for: FINALDX  No results found.      ----------------------------------------------------------------------------------------------------------------------  This note was dictated with Dragon Medical and may contain typos missed during proofreading.

## 2022-03-16 NOTE — Unmapped (Signed)
Left VM to clarify how many mg patient is on

## 2022-03-17 DIAGNOSIS — M353 Polymyalgia rheumatica: Principal | ICD-10-CM

## 2022-03-17 MED ORDER — PREDNISONE 1 MG TABLET
ORAL_TABLET | Freq: Every day | ORAL | 0 refills | 135 days
Start: 2022-03-17 — End: 2023-03-17

## 2022-03-17 MED ORDER — PREDNISONE 5 MG TABLET
ORAL_TABLET | Freq: Every day | ORAL | 1 refills | 90 days
Start: 2022-03-17 — End: ?

## 2022-03-17 NOTE — Unmapped (Unsigned)
Pred refill  Last Visit Date: 10/21/2021  Next Visit Date: 03/23/2022

## 2022-03-17 NOTE — Unmapped (Signed)
Attempt #2:  Attempted to call patient to find out how many mg patient needs refill on for prednisone. Left VM fro patient to call the clnic back or to send [place] a mychart message. Mychart message also sent.

## 2022-03-18 LAB — HEMOGLOBIN A1C
ESTIMATED AVERAGE GLUCOSE: 134 mg/dL
HEMOGLOBIN A1C: 6.3 % — ABNORMAL HIGH (ref 4.9–6.1)

## 2022-03-18 MED ORDER — PREDNISONE 1 MG TABLET
ORAL_TABLET | Freq: Every day | ORAL | 0 refills | 30 days | Status: CP
Start: 2022-03-18 — End: 2023-03-18

## 2022-03-18 MED ORDER — PREDNISONE 5 MG TABLET
ORAL_TABLET | Freq: Every day | ORAL | 0 refills | 30 days | Status: CP
Start: 2022-03-18 — End: ?
  Filled 2022-03-22: qty 30, 30d supply, fill #0

## 2022-03-18 MED ORDER — REPATHA SURECLICK 140 MG/ML SUBCUTANEOUS PEN INJECTOR
SUBCUTANEOUS | 0 refills | 84 days | Status: CP
Start: 2022-03-18 — End: ?
  Filled 2022-03-28: qty 6, 84d supply, fill #0

## 2022-03-22 MED FILL — CARVEDILOL 12.5 MG TABLET: ORAL | 90 days supply | Qty: 180 | Fill #1

## 2022-03-22 MED FILL — OLMESARTAN 40 MG TABLET: ORAL | 90 days supply | Qty: 90 | Fill #1

## 2022-03-22 MED FILL — FOLIC ACID 1 MG TABLET: ORAL | 90 days supply | Qty: 180 | Fill #2

## 2022-03-24 NOTE — Unmapped (Addendum)
[facility] Refill Coordination Note    Specialty Medication(s) to be Shipped:   General Specialty: Repatha    Other medication(s) to be shipped:  sertraline and prednisone     Madison Harris, DOB: [birth year]  Phone: [phone] (home)       All above HIPAA information was verified with patient.     Was a Nurse, learning disability used for this call? No    Completed refill call assessment today to schedule patient's medication shipment from the [facility] [phone]).  All relevant notes have been reviewed.     Specialty medication(s) and dose(s) confirmed: Regimen is correct and unchanged.   Changes to medications: Verenisse reports no changes at this time.  Changes to insurance: No  New side effects reported not previously addressed with a pharmacist or physician: None reported  Questions for the pharmacist: No    Confirmed patient received a Conservation officer, historic buildings and a Surveyor, mining with first shipment. The patient will receive a drug information handout for each medication shipped and additional FDA Medication Guides as required.       DISEASE/MEDICATION-SPECIFIC INFORMATION        For patients on injectable medications: Patient currently has unsure doses left.  Next injection is scheduled for 03/30/22.    SPECIALTY MEDICATION ADHERENCE     Medication Adherence    Patient reported X missed doses in the last month: 1  Specialty Medication: REPATHA SURECLICK 140 mg/mL Pnij (evolocumab)  Patient is on additional specialty medications: No                                Were doses missed due to medication being on hold? No    Repatha 140 mg/ml: unknown days of medicine on hand        REFERRAL TO PHARMACIST     Referral to the pharmacist: Yes - routine compliance concerns. Patient has missed 1-3 doses of medication. Refills were scheduled and concern routed to pharmacist for evaluation.      SHIPPING     Shipping address confirmed in Epic.     Delivery Scheduled: Yes, Expected medication delivery date: 03/28/22.     Medication will be delivered via UPS to the prescription address in Epic WAM.    Unk Lightning   [facility] Specialty Technician

## 2022-04-06 ENCOUNTER — Ambulatory Visit: Admit: 2022-04-06 | Discharge: 2022-04-07 | Payer: PRIVATE HEALTH INSURANCE

## 2022-04-06 DIAGNOSIS — M8580 Other specified disorders of bone density and structure, unspecified site: Principal | ICD-10-CM

## 2022-04-06 DIAGNOSIS — M0600 Rheumatoid arthritis without rheumatoid factor, unspecified site: Principal | ICD-10-CM

## 2022-04-06 DIAGNOSIS — G8929 Other chronic pain: Principal | ICD-10-CM

## 2022-04-06 DIAGNOSIS — M0609 Rheumatoid arthritis without rheumatoid factor, multiple sites: Principal | ICD-10-CM

## 2022-04-06 DIAGNOSIS — E559 Vitamin D deficiency, unspecified: Principal | ICD-10-CM

## 2022-04-06 DIAGNOSIS — M7711 Lateral epicondylitis, right elbow: Principal | ICD-10-CM

## 2022-04-06 DIAGNOSIS — M353 Polymyalgia rheumatica: Principal | ICD-10-CM

## 2022-04-06 DIAGNOSIS — G5601 Carpal tunnel syndrome, right upper limb: Principal | ICD-10-CM

## 2022-04-06 DIAGNOSIS — Z79631 Methotrexate, long term, current use: Principal | ICD-10-CM

## 2022-04-06 LAB — CBC W/ AUTO DIFF
BASOPHILS ABSOLUTE COUNT: 0 10*9/L (ref 0.0–0.1)
BASOPHILS RELATIVE PERCENT: 0.7 %
EOSINOPHILS ABSOLUTE COUNT: 0.1 10*9/L (ref 0.0–0.5)
EOSINOPHILS RELATIVE PERCENT: 1 %
HEMATOCRIT: 44.3 % — ABNORMAL HIGH (ref 34.0–44.0)
HEMOGLOBIN: 14.9 g/dL (ref 11.3–14.9)
LYMPHOCYTES ABSOLUTE COUNT: 0.8 10*9/L — ABNORMAL LOW (ref 1.1–3.6)
LYMPHOCYTES RELATIVE PERCENT: 14.8 %
MEAN CORPUSCULAR HEMOGLOBIN CONC: 33.6 g/dL (ref 32.0–36.0)
MEAN CORPUSCULAR HEMOGLOBIN: 35 pg — ABNORMAL HIGH (ref 25.9–32.4)
MEAN CORPUSCULAR VOLUME: 104.3 fL — ABNORMAL HIGH (ref 77.6–95.7)
MEAN PLATELET VOLUME: 9.8 fL (ref 6.8–10.7)
MONOCYTES ABSOLUTE COUNT: 0.2 10*9/L — ABNORMAL LOW (ref 0.3–0.8)
MONOCYTES RELATIVE PERCENT: 3.9 %
NEUTROPHILS ABSOLUTE COUNT: 4.2 10*9/L (ref 1.8–7.8)
NEUTROPHILS RELATIVE PERCENT: 79.6 %
PLATELET COUNT: 144 10*9/L — ABNORMAL LOW (ref 150–450)
RED BLOOD CELL COUNT: 4.25 10*12/L (ref 3.95–5.13)
RED CELL DISTRIBUTION WIDTH: 13.7 % (ref 12.2–15.2)
WBC ADJUSTED: 5.3 10*9/L (ref 3.6–11.2)

## 2022-04-06 LAB — CALCIUM: CALCIUM: 9.5 mg/dL (ref 8.7–10.4)

## 2022-04-06 MED ORDER — PREDNISONE 5 MG TABLET
ORAL_TABLET | Freq: Every day | ORAL | 0 refills | 90 days | Status: CP
Start: 2022-04-06 — End: ?

## 2022-04-06 MED ORDER — FOLIC ACID 1 MG TABLET
ORAL_TABLET | Freq: Every day | ORAL | 3 refills | 90 days | Status: CP
Start: 2022-04-06 — End: ?

## 2022-04-06 MED ORDER — OTREXUP (PF) 20 MG/0.4 ML SUBCUTANEOUS AUTO-INJECTOR
SUBCUTANEOUS | 3 refills | 84 days | Status: CP
Start: 2022-04-06 — End: ?
  Filled 2022-04-12: qty 1.6, 28d supply, fill #0

## 2022-04-06 MED ORDER — PREDNISONE 1 MG TABLET
ORAL_TABLET | Freq: Every day | ORAL | 2 refills | 90 days | Status: CP
Start: 2022-04-06 — End: ?

## 2022-04-06 NOTE — Unmapped (Signed)
Rheumatology return visit     REASON FOR VISIT: f/u PMR vs SNRA     HISTORY: [name] is a 66 y.o. 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  Initially diagnosed with PMR by outside rheumatologist, severe stiffness in hips and neck. 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 Started SQ mtx (otrexup) in 04/2018. Transferred care to our clinic in 09/2018 due to insurance. Mtx increased to 20 mg at that visit. Repeat RF and CCP were negative. Suspect that underlying diagnosis is PMR.  Treatment history:  - Prednisone taper  - Difficulty tapering prednisone, so mtx (otrexup) added 04/2018  - Transferred care to [facility] rheumatology in 09/2018  - Worsening pain again after tapering to 3 mg, increased back to 7.5 mg in 10/2019  - She then reported increasing pain and transient HA, pain with chewing. Recommended further increase in prednisone, but pt wished to remain on 7.5 mg.   - Hip XR in 10/2019 revealed ?mass over sacrum, but MRI f/u revealed no sacral mass.   - Worsening HAs, though not clearly GCA, increased pred to 10 mg in 11/2019 with plans to start actemra, delayed due to [place] of state.   - Attempted taper from 10 mg pred in 05/2020, but pt unable to tolerate tapering below 7 mg.   - Recommended adding actemra in 11/2020, delay due to insurance, started 06/2021  - Current medication regimen: mtx sq (otrexup) 20 mg qwk, FA 2 mg qd, actemra 162 mg q 14 days, prednisone 7 mg    Interim history:   Presents today for f/u.     At last visit she was taking 6 mg prednisone and we discussed tapering the prednisone. She states she tried to do this, but could not tolerate lower doses due to increase in pain. She has difficulty explaining where on her body had pain. She does think she had aching in her arms. She self increased prednisone to 7 mg because at lower doses she had increased pain.   She notes pain in the R forearm since December. This is not better with prednisone. She notes an aching pain in the proximal R forearm that waxes and wanes, but sometimes she is unable to use her arm at all due to pain. Associated intermittent numbness and tingling in fingers of R hand, not sure which fingers involved. Sometimes has pain in the elbow and sometimes on the shoulder on the R. Loss of strength in the R hand and dropping things due to this.     She continues actemra but has felt no differently since starting this. She wonders if she needs to continue taking this.     Her pains are worse with activity and better with rest.   Worse with activity and better with rest.     Considered adding duloxetine, but did not since she just started sertraline       CURRENT MEDICATIONS:  Current Outpatient Medications   Medication Sig Dispense Refill    aspirin (ECOTRIN) 81 MG tablet Take 1 tablet (81 mg total) by mouth.      blood sugar diagnostic (ACCU-CHEK GUIDE TEST STRIPS) Strp Use to check blood sugar once daily (fasting) 100 each 3    blood-glucose meter kit Check blood sugar daily. 1 each 0    blood-glucose meter Misc Use once daily to check blood sugar as directed by your physician 1 each 0    carvediloL (COREG)  12.5 MG tablet Take 1 tablet (12.5 mg total) by mouth 2 (two) times a day with meals. 180 tablet 1    dapagliflozin propanediol (FARXIGA) 10 mg Tab tablet Take 1 tablet (10 mg total) by mouth every morning. 90 tablet 1    evolocumab (REPATHA SURECLICK) 140 mg/mL PnIj Inject the contents of one pen (140 mg) under the skin every fourteen (14) days. 6 mL 0    folic acid (FOLVITE) 1 MG tablet Take 2 tablets (2 mg total) by mouth daily. 180 tablet 3    lancets Misc Use as directed 200 each 2    lancets Misc Use to check blood sugar once daily (fasting) 100 each 4    methotrexate, PF, (OTREXUP, PF,) 20 mg/0.4 mL AtIn Inject the contents of 1 pen (20mg ) under the skin every 7 days 4.8 mL 3    nitroglycerin (NITROSTAT) 0.4 MG SL tablet PLACE 1 TABLET UNDER THE TONGUE EVERY 5 MINUTES AS NEEDED FOR CHEST PAIN UP TO 3 DOSES THEN CALL 911 IF NO RELIEF      olmesartan (BENICAR) 40 MG tablet Take 1 tablet (40 mg total) by mouth daily. 90 tablet 4    pen needle, diabetic 31 gauge x 5/16 (8 mm) Ndle Use 1-3 times daily as directed. 100 each 3    predniSONE (DELTASONE) 1 MG tablet Take 2 tablets (2 mg total) by mouth daily. Take as directed for taper 60 tablet 0    predniSONE (DELTASONE) 5 MG tablet Take 1 tablet (5 mg total) by mouth daily. 30 tablet 0    safety needles 25 x 5/8  Ndle Use once a week for 4 weeks for b12 injections then once a month after 4 weeks 100 each 3    semaglutide (OZEMPIC) 1 mg/dose (4 mg/3 mL) PnIj injection Inject 1 mg under the skin every seven (7) days. 9 mL 1    sertraline (ZOLOFT) 100 MG tablet Take 1 tablet (100 mg total) by mouth daily. 90 tablet 3    syringe with needle (BD LUER-LOK SYRINGE) 3 mL 25 gauge x 1 Syrg Use once a week for 4 weeks for b12 injections then once a month after 4 weeks 100 each 2    tocilizumab (ACTEMRA ACTPEN) 162 mg/0.9 mL PnIj Inject 162 mg under the skin every fourteen (14) days. 5.4 mL 3     No current facility-administered medications for this visit.       Past Medical History:   Diagnosis Date    Cataract     Diabetes mellitus (CMS-HCC)     Type 2     Hyperlipidemia     Hypertension         Record Review: Available records were reviewed, including pertinent office visits, labs, and imaging.      REVIEW OF SYSTEMS: Ten system were reviewed and negative except as noted above.    PHYSICAL EXAM:  VITAL SIGNS:   Vitals:    04/06/22 1017   BP: 120/57   Pulse: 53   Temp: 36.8 ??C (98.3 ??F)   Weight: 68 kg (150 lb)   Height: 154.9 cm (5' 1)     General:   Pleasant 66 y.o.female in no acute distress, WDWN   Cardiovascular:  Regular rate and rhythm. No murmur, rub, or gallop. No lower extremity edema.    Lungs:  Clear to auscultation.Normal respiratory effort.    Musculoskeletal:   General: Ambulates w/o assistance.    Hands: No swelling or tenderness. Able to make  a tight fist.  Wrists: Reduced extension of the left. No swelling or tenderness   Elbows: FROM w/o swelling. Tenderness of the lateral epicondyle on R. Pain over lateral epicondyle on R worse with forced extension of wrist.   Shoulders: FROM b/l   Knees: FROM w/o effusions   Ankles: No swelling or tenderness    Neuro:   + tinel on R    Psych:  Appropriate affect and mood   Skin:  No rashes.          ASSESSMENT/PLAN:    1.  Hx of seronegative RA/PMR, continued chronic pain   I do not suspect that her pain currently is due to PMR or RA. She has not noted improvement in pain or improved ability to taper prednisone on actemra, so we will stop this. She will continue mtx (otrexup) 20 mg sq qwk and FA 2 mg qd for now.   Discussed that I suspect her continued pain is not due to inflammatory arthritis or PMR. Could consider referral to pain clinic for continued management. Also considered starting cymbalta, but she recently started zoloft, and would not want to combine these.   Recommend tapering prednisone by 1 mg q mo until off.     2. Carpal tunnel syndrome of right wrist  Suspect carpal tunnel of R wrist or possibly R ulnar neuropathy. EMG ordered.   - EMG; Future    3. Lateral epicondylitis of right elbow  Suspect this is the etiology of the R elbow and forearm pain. Recommend PT, but she cites financial strain. Stretches for her to perform at home rendered.     4. Methotrexate, long term, current use  Checking labs below to evaluate for medication toxicity.    - Albumin  - ALT  - AST  - CBC w/ Differential  - Creatinine    5.  Osteopenia  Noted on DEXA 11/21/2019, T score L spine +0.5, femur neck -1.6. FRAX score showing 10 yr probability of 13% for of major osteoporotic fracture and a 1.3% for hip fracture.   Recommend resuming calcium and vitamin D supplements.   Updated dexa ordered, recommend she schedule this.   - Vitamin D 25 Hydroxy (25OH D2 + D3)  - Calcium        HCM:   -Pneumonia vaccine: did not discuss   - COVID-19 vaccine status:Moderna 08/16/2019, 09/13/2019, 05/19/2020, 08/27/2020. Recommend new booster.   - Annual Influenza vaccine. Status: UTD per pt  - Bone health: See note on osteopenia above  -Contraception: Hysterectomy      Greater than 40 minutes spent in visit with patient, including pre and postvisit activities.  Return in 3 mo with myself and 6 mo with Dr Scarlette Calico

## 2022-04-06 NOTE — Unmapped (Addendum)
Ok to stop the actemra. Continue otrexup and folic acid.     Start with at home exercises for the elbow pain. If this is not working, may need to consider formal physical therapy.      Order placed for nerve conduction study (EMG) on the right arm.      Please call radiology to schedule your Dexa bone density study at [phone].     Recommend tapering prednisone:  Starting tomorrow: take prednisone 6 mg daily  Starting Nov 15: take prednisone 5 mg daily  Starting Dec 15: take prednisone 4 mg daily  Starting Jan 15: take prednisone 3 mg daily  Starting Feb 15: take prednisone 2 mg daily  Starting March 15: take prednisone 1 mg daily  Starting April 15: take prednisone 1 mg every other day  Starting May 15: stop prednisone

## 2022-04-07 NOTE — Unmapped (Addendum)
[facility] Refill Coordination Note    Specialty Medication(s) to be Shipped:   Inflammatory Disorders: Otrexup    Other medication(s) to be shipped:  Madison Harris, DOB: 12/09/1955  Phone: [phone] (home)       All above HIPAA information was verified with patient.     Was a Nurse, learning disability used for this call? No    Completed refill call assessment today to schedule patient's medication shipment from the [facility] [phone]).  All relevant notes have been reviewed.     Specialty medication(s) and dose(s) confirmed: Regimen is correct and unchanged.   Changes to medications: [name] reports no changes at this time.  Changes to insurance: No  New side effects reported not previously addressed with a pharmacist or physician: None reported  Questions for the pharmacist: No    Confirmed patient received a Conservation officer, historic buildings and a Surveyor, mining with first shipment. The patient will receive a drug information handout for each medication shipped and additional FDA Medication Guides as required.       DISEASE/MEDICATION-SPECIFIC INFORMATION        For patients on injectable medications: Patient currently has 1 doses left.  Next injection is scheduled for 04/11/22.    SPECIALTY MEDICATION ADHERENCE     Medication Adherence    Patient reported X missed doses in the last month: 0  Specialty Medication: Otrexup  Patient is on additional specialty medications: No                                Were doses missed due to medication being on hold? No      REFERRAL TO PHARMACIST     Referral to the pharmacist: Not needed      [facility]     Shipping address confirmed in Epic.     Delivery Scheduled: Yes, Expected medication delivery date: 04/13/22.     Medication will be delivered via UPS to the prescription address in Epic WAM.    Quintella Reichert   [facility] Specialty Technician

## 2022-04-12 MED FILL — FARXIGA 10 MG TABLET: ORAL | 90 days supply | Qty: 90 | Fill #1

## 2022-04-13 LAB — VITAMIN D 25 HYDROXY: VITAMIN D, TOTAL (25OH): 27.4 ng/mL (ref 20.0–80.0)

## 2022-07-12 DIAGNOSIS — E1165 Type 2 diabetes mellitus with hyperglycemia: Principal | ICD-10-CM

## 2022-07-12 DIAGNOSIS — Z794 Long term (current) use of insulin: Principal | ICD-10-CM

## 2022-07-12 MED ORDER — OZEMPIC 1 MG/DOSE (4 MG/3 ML) SUBCUTANEOUS PEN INJECTOR
SUBCUTANEOUS | 1 refills | 84 days | Status: CP
Start: 2022-07-12 — End: ?
  Filled 2022-07-18: qty 3, 28d supply, fill #0

## 2022-07-13 DIAGNOSIS — Z794 Long term (current) use of insulin: Principal | ICD-10-CM

## 2022-07-13 DIAGNOSIS — E1165 Type 2 diabetes mellitus with hyperglycemia: Principal | ICD-10-CM

## 2022-07-13 NOTE — Unmapped (Signed)
Patient is requesting the following refill  Requested Prescriptions     Pending Prescriptions Disp Refills    semaglutide (OZEMPIC) 1 mg/dose (4 mg/3 mL) PnIj injection 9 mL 1     Sig: Inject 1 mg under the skin every seven (7) days.       Recent Visits  Date Type Provider Dept   03/16/22 Office Visit Livingston Diones, MD [facility]   Showing recent visits within past 365 days with a meds authorizing provider and meeting all other requirements  Future Appointments  No visits were found meeting these conditions.  Showing future appointments within next 365 days with a meds authorizing provider and meeting all other requirements       Labs: A1c:   Hemoglobin A1C (%)   Date Value   03/16/2022 6.3 (H)   01/23/2019 9.0 (H)    Creatinine:   Creatinine (mg/dL)   Date Value   47/82/9562 0.86   07/25/2018 1.2

## 2022-07-18 MED FILL — SERTRALINE 100 MG TABLET: ORAL | 90 days supply | Qty: 90 | Fill #1

## 2022-07-21 DIAGNOSIS — I2510 Atherosclerotic heart disease of native coronary artery without angina pectoris: Principal | ICD-10-CM

## 2022-07-21 MED ORDER — REPATHA SURECLICK 140 MG/ML SUBCUTANEOUS PEN INJECTOR
SUBCUTANEOUS | 0 refills | 84 days | Status: CP
Start: 2022-07-21 — End: ?

## 2022-07-21 NOTE — Unmapped (Signed)
The [facility] has made a second and final attempt to reach this patient to refill the following medication:OTREXUP and REPATHA SURECLICK 140 mg/mL.      We have left voicemails on the following phone numbers: [phone], have sent a MyChart message, have sent a text message to the following phone numbers: [phone], and have sent a Mychart questionnaire..    Dates contacted: 12/29 and 07/21/22  Last scheduled delivery: 04/13/22 and 03/29/22    The patient may be at risk of non-compliance with this medication. The patient should call the [facility] at [phone]  Option 4, then Option 2 (all other specialty patients) to refill medication.    Unk Lightning   [facility] Specialty Technician

## 2022-07-21 NOTE — Unmapped (Signed)
Refill request received for patient.      Medication Requested: repatha  Last Office Visit: 10/28/2021   Next Office Visit: Visit date not found  Last Prescriber: Eppie Gibson    Nurse refill requirements met? Yes  If not met, why: n/a    Sent to: Provider for signing  If sent to provider, which provider?: Eppie Gibson

## 2022-07-22 DIAGNOSIS — I2510 Atherosclerotic heart disease of native coronary artery without angina pectoris: Principal | ICD-10-CM

## 2022-07-22 NOTE — Unmapped (Signed)
[facility] - PHARMACIST NOTES     [facility] sent message that they have been unable to reach patient for refill of Otrexup and Repatha. Left VM and provided [facility] contact information.    Madison Harris  PharmD Candidate [address] [employer] Intern

## 2022-07-26 NOTE — Unmapped (Signed)
[facility] Clinical Intervention    Type of intervention: Medication adherence    Medication involved: Repatha and Otrexup    Problem identified: Patient declined fill of both medications stating she is no longer on them.     Intervention performed: Since there were not notes in the system about patient stopping medications, reached out to inquire on reasoning.  Patient states she is using CBD and feels that controls her pain/inflammation and she does not have RA.  She stopped Otrexup for this reason.  Patient believed Repatha was also for arthritis but explained that this is for her cholesterol.  Patient has not been using that over the past few months but will start back on Repatha now.  She has 3 injections on hand which should last her ~6 weeks.      Follow-up needed: 4 weeks to schedule Repatha refill     Approximate time spent: 5-10 minutes    Clinical evidence used to support intervention: Chart review    Result of the intervention: Improved medication adherence    Julianne Rice, PharmD   [facility] Specialty Pharmacist

## 2022-07-26 NOTE — Unmapped (Signed)
Madison Harris has been contacted in regards to their refill of Repatha and Otrexup. At this time, they have declined refill due to  no longer taking medications .

## 2022-07-27 NOTE — Unmapped (Signed)
Attempted to call patient twice. Left VM for her to call us back or respond via mychart since she is active on mychart. Sent patient a Clinical cytogeneticist message with the questions requested.

## 2022-08-22 DIAGNOSIS — E1165 Type 2 diabetes mellitus with hyperglycemia: Principal | ICD-10-CM

## 2022-08-22 DIAGNOSIS — Z794 Long term (current) use of insulin: Principal | ICD-10-CM

## 2022-08-22 MED ORDER — DAPAGLIFLOZIN PROPANEDIOL 10 MG TABLET
ORAL_TABLET | Freq: Every morning | ORAL | 1 refills | 90 days
Start: 2022-08-22 — End: ?

## 2022-08-22 NOTE — Unmapped (Signed)
Madison Harris has been contacted in regards to their refill of Repatha. At this time, they have declined refill due to  patient has enough medication at this time, could not specify how much . Refill assessment call date has been updated per the patient's request.  Patient called in refills for non-spec medications    Delila Spence, BS PharmD - Pharmacist - [facility]   [address], [place], [place] [address]   Phone: [phone] Ext [phone]- Fax. [phone]

## 2022-08-23 MED ORDER — DAPAGLIFLOZIN PROPANEDIOL 10 MG TABLET
ORAL_TABLET | Freq: Every morning | ORAL | 1 refills | 90 days | Status: CP
Start: 2022-08-23 — End: ?
  Filled 2022-08-29: qty 90, 90d supply, fill #0

## 2022-08-23 NOTE — Unmapped (Signed)
Patient is requesting the following refill  Requested Prescriptions     Pending Prescriptions Disp Refills    dapagliflozin propanediol (FARXIGA) 10 mg Tab tablet 90 tablet 1     Sig: Take 1 tablet (10 mg total) by mouth every morning.       Recent Visits  Date Type Provider Dept   03/16/22 Office Visit Livingston Diones, MD [facility]   Showing recent visits within past 365 days with a meds authorizing provider and meeting all other requirements  Future Appointments  No visits were found meeting these conditions.  Showing future appointments within next 365 days with a meds authorizing provider and meeting all other requirements       Labs: A1c:   Hemoglobin A1C (%)   Date Value   03/16/2022 6.3 (H)   01/23/2019 9.0 (H)    Creatinine:   Creatinine (mg/dL)   Date Value   29/56/2130 0.86   07/25/2018 1.2

## 2022-08-29 MED FILL — OLMESARTAN 40 MG TABLET: ORAL | 90 days supply | Qty: 90 | Fill #2

## 2022-08-29 MED FILL — OZEMPIC 1 MG/DOSE (4 MG/3 ML) SUBCUTANEOUS PEN INJECTOR: SUBCUTANEOUS | 28 days supply | Qty: 3 | Fill #1

## 2022-09-05 NOTE — Unmapped (Unsigned)
**  INCOMPLETE---LVM, If patient has stopped Otrexup, discontinue Rx (MD Is already aware). If Repatha is the only spec med then move enrollment to PCSK9, [place] copay card is not working so RX is $120/84 days, patient cal call MFR at [phone] to discuss options**      Update on [employer]

## 2022-09-20 DIAGNOSIS — I2510 Atherosclerotic heart disease of native coronary artery without angina pectoris: Principal | ICD-10-CM

## 2022-09-20 DIAGNOSIS — Z794 Long term (current) use of insulin: Principal | ICD-10-CM

## 2022-09-20 DIAGNOSIS — E1165 Type 2 diabetes mellitus with hyperglycemia: Principal | ICD-10-CM

## 2022-09-30 DIAGNOSIS — E1165 Type 2 diabetes mellitus with hyperglycemia: Principal | ICD-10-CM

## 2022-09-30 DIAGNOSIS — I2510 Atherosclerotic heart disease of native coronary artery without angina pectoris: Principal | ICD-10-CM

## 2022-09-30 DIAGNOSIS — Z794 Long term (current) use of insulin: Principal | ICD-10-CM

## 2022-09-30 MED ORDER — REPATHA SURECLICK 140 MG/ML SUBCUTANEOUS PEN INJECTOR
SUBCUTANEOUS | 0 refills | 84 days | Status: CP
Start: 2022-09-30 — End: ?

## 2022-09-30 MED ORDER — OZEMPIC 1 MG/DOSE (4 MG/3 ML) SUBCUTANEOUS PEN INJECTOR
SUBCUTANEOUS | 1 refills | 84 days | Status: CP
Start: 2022-09-30 — End: ?

## 2022-09-30 NOTE — Unmapped (Signed)
Refill request received for patient.      Medication Requested: Repatha   Last Office Visit: 10/28/2021   Next Office Visit: Visit date not found  Last Prescriber: Andrey Farmer    Nurse refill requirements met? Yes  If not met, why: New Pharmacy    Sent to: Pharmacy per protocol  If sent to provider, which provider?:

## 2022-09-30 NOTE — Unmapped (Signed)
Patient is requesting the following refill  Requested Prescriptions     Pending Prescriptions Disp Refills    semaglutide (OZEMPIC) 1 mg/dose (4 mg/3 mL) PnIj injection 9 mL 1     Sig: Inject 1 mg under the skin every seven (7) days.       Recent Visits  Date Type Provider Dept   03/16/22 Office Visit Livingston Diones, MD [facility]   Showing recent visits within past 365 days with a meds authorizing provider and meeting all other requirements  Future Appointments  Date Type Provider Dept   11/24/22 Appointment Livingston Diones, MD [facility]   Showing future appointments within next 365 days with a meds authorizing provider and meeting all other requirements       Labs: A1c:   Hemoglobin A1C (%)   Date Value   03/16/2022 6.3 (H)   01/23/2019 9.0 (H)

## 2022-10-10 DIAGNOSIS — L989 Disorder of the skin and subcutaneous tissue, unspecified: Principal | ICD-10-CM

## 2022-10-10 NOTE — Unmapped (Signed)
Referral has been faxed to [facility]

## 2022-11-04 DIAGNOSIS — M353 Polymyalgia rheumatica: Principal | ICD-10-CM

## 2022-11-04 DIAGNOSIS — Z7952 Long term (current) use of systemic steroids: Principal | ICD-10-CM

## 2022-11-04 MED ORDER — FOLIC ACID 1 MG TABLET
ORAL_TABLET | Freq: Every day | ORAL | 3 refills | 90 days | Status: CP
Start: 2022-11-04 — End: ?

## 2022-11-04 NOTE — Unmapped (Signed)
folic acid (FOLVITE) 1 MG tablet     Last Visit Date: Visit date not found  Next Visit Date: Visit date not found    Lab Results   Component Value Date    ALT 35 03/16/2022    AST 20 03/16/2022    ALBUMIN 3.7 03/16/2022    CREATININE 0.86 03/16/2022     Lab Results   Component Value Date    WBC 5.3 04/06/2022    HGB 14.9 04/06/2022    HCT 44.3 (H) 04/06/2022    PLT 144 (L) 04/06/2022     Lab Results   Component Value Date    NEUTROPCT 79.6 04/06/2022    LYMPHOPCT 14.8 04/06/2022    MONOPCT 3.9 04/06/2022    EOSPCT 1.0 04/06/2022    BASOPCT 0.7 04/06/2022

## 2022-11-04 NOTE — Unmapped (Signed)
Referral was faxed to Dr. Jacqualine Mau at [facility].

## 2022-11-04 NOTE — Unmapped (Signed)
Rheum referral entered by PCP.  Received VM from pt asking for notes to be faxed to [facility] at [phone].  Dr Edwinna Areola, confirmation received

## 2022-11-07 MED ORDER — SERTRALINE 100 MG TABLET
ORAL_TABLET | Freq: Every day | ORAL | 1 refills | 90 days | Status: CP
Start: 2022-11-07 — End: 2023-11-07

## 2022-11-24 ENCOUNTER — Ambulatory Visit: Admit: 2022-11-24 | Discharge: 2022-11-25 | Payer: MEDICARE | Attending: Internal Medicine | Primary: Internal Medicine

## 2022-11-24 DIAGNOSIS — Z91199 Nonadherence to medical treatment: Principal | ICD-10-CM

## 2022-11-24 DIAGNOSIS — Z0000 Encounter for general adult medical examination without abnormal findings: Principal | ICD-10-CM

## 2022-11-24 DIAGNOSIS — Z794 Long term (current) use of insulin: Principal | ICD-10-CM

## 2022-11-24 DIAGNOSIS — Z7952 Long term (current) use of systemic steroids: Principal | ICD-10-CM

## 2022-11-24 DIAGNOSIS — E782 Mixed hyperlipidemia: Principal | ICD-10-CM

## 2022-11-24 DIAGNOSIS — M353 Polymyalgia rheumatica: Principal | ICD-10-CM

## 2022-11-24 DIAGNOSIS — I2510 Atherosclerotic heart disease of native coronary artery without angina pectoris: Principal | ICD-10-CM

## 2022-11-24 DIAGNOSIS — G4733 Obstructive sleep apnea (adult) (pediatric): Principal | ICD-10-CM

## 2022-11-24 DIAGNOSIS — Z1231 Encounter for screening mammogram for malignant neoplasm of breast: Principal | ICD-10-CM

## 2022-11-24 DIAGNOSIS — E119 Type 2 diabetes mellitus without complications: Principal | ICD-10-CM

## 2022-11-24 DIAGNOSIS — I10 Essential (primary) hypertension: Principal | ICD-10-CM

## 2022-11-24 LAB — COMPREHENSIVE METABOLIC PANEL
ALBUMIN/GLOBULIN RATIO: 1.1 (ref 1.1–2.2)
ALBUMIN: 3.6 g/dL (ref 3.5–5.0)
ALKALINE PHOSPHATASE: 95 U/L (ref 50–136)
ALT (SGPT): 33 U/L (ref 12–78)
ANION GAP: 12 mmol/L
AST (SGOT): 22 U/L (ref 7–31)
BILIRUBIN TOTAL: 0.5 mg/dL (ref 0.0–1.5)
BLOOD UREA NITROGEN: 26 mg/dL (ref 5–26)
BUN / CREAT RATIO: 28 — ABNORMAL HIGH (ref 12–25)
CALCIUM: 9 mg/dL (ref 8.5–10.5)
CHLORIDE: 101 mmol/L (ref 95–110)
CO2: 24 mmol/L (ref 21.0–31.0)
CREATININE: 0.93 mg/dL (ref 0.50–1.50)
EGFR CKD-EPI (2021) FEMALE: 68 mL/min/{1.73_m2} (ref >=60)
GLOBULIN, TOTAL: 3.3 g/dL (ref 1.5–4.3)
GLUCOSE RANDOM: 282 mg/dL — ABNORMAL HIGH (ref 60–100)
POTASSIUM: 4.4 mmol/L (ref 3.5–5.1)
PROTEIN TOTAL: 6.9 g/dL (ref 6.4–8.3)
SODIUM: 137 mmol/L (ref 136–145)

## 2022-11-24 LAB — CBC
HEMATOCRIT: 42.3 % (ref 35.0–45.0)
HEMOGLOBIN: 14.3 g/dL (ref 11.3–15.5)
MEAN CORPUSCULAR HEMOGLOBIN CONC: 33.9 g/dL (ref 31.0–36.0)
MEAN CORPUSCULAR HEMOGLOBIN: 32.5 pg — ABNORMAL HIGH (ref 26.0–32.0)
MEAN CORPUSCULAR VOLUME: 96 fL (ref 89.0–97.0)
MEAN PLATELET VOLUME: 7.5 fL (ref 6.5–8.9)
PLATELET COUNT: 176 10*9/L (ref 140–440)
RED BLOOD CELL COUNT: 4.41 10*12/L (ref 3.80–5.10)
RED CELL DISTRIBUTION WIDTH: 15.1 % — ABNORMAL HIGH (ref 11.0–15.0)
WBC ADJUSTED: 5.9 10*9/L (ref 3.8–10.8)

## 2022-11-24 LAB — LIPID PANEL
CHOLESTEROL/HDL RATIO SCREEN: 3.4 (ref 0.0–4.5)
CHOLESTEROL: 252 mg/dL — ABNORMAL HIGH (ref 0–200)
HDL CHOLESTEROL: 74 mg/dL — ABNORMAL HIGH (ref 40–60)
LDL CHOLESTEROL CALCULATED: 151 mg/dL — ABNORMAL HIGH (ref 30–130)
NON-HDL CHOLESTEROL: 178 mg/dL
TRIGLYCERIDES: 134 mg/dL (ref <=150)
VLDL CHOLESTEROL CAL: 26.8 mg/dL (ref 25–40)

## 2022-11-24 LAB — ALBUMIN / CREATININE URINE RATIO
ALBUMIN QUANT URINE: 1.5 mg/dL
ALBUMIN/CREATININE RATIO: 35.9 ug/mg — ABNORMAL HIGH (ref 0.0–30.0)
CREATININE, URINE: 41.8 mg/dL

## 2022-11-24 MED ORDER — OLMESARTAN 40 MG TABLET
ORAL_TABLET | Freq: Every day | ORAL | 4 refills | 90 days | Status: CP
Start: 2022-11-24 — End: 2023-11-24

## 2022-11-24 MED ORDER — DULOXETINE 60 MG CAPSULE,DELAYED RELEASE
ORAL_CAPSULE | Freq: Every day | ORAL | 3 refills | 90 days | Status: CP
Start: 2022-11-24 — End: 2023-11-24

## 2022-11-24 MED ORDER — DAPAGLIFLOZIN PROPANEDIOL 10 MG TABLET
ORAL_TABLET | Freq: Every morning | ORAL | 3 refills | 90 days | Status: CP
Start: 2022-11-24 — End: ?

## 2022-11-24 MED ORDER — OZEMPIC 1 MG/DOSE (4 MG/3 ML) SUBCUTANEOUS PEN INJECTOR
SUBCUTANEOUS | 1 refills | 84 days | Status: CP
Start: 2022-11-24 — End: ?

## 2022-11-24 NOTE — Unmapped (Signed)
Addended by: Doroteo Bradford on: 11/24/2022 11:19 AM     Modules accepted: Orders

## 2022-11-24 NOTE — Unmapped (Signed)
[facility] 9[address]  [facility] INTERNAL MEDICINE  [address] Gerre Harris  [place] [address]  Dept: [phone]  Loc: [phone]     ASSESSMENT & PLAN     1. Annual physical exam    2. CAD s/p PCI    3. Essential hypertension    4. Mixed hyperlipidemia    5. T2DM, controlled    6. PMR (polymyalgia rheumatica) (CMS-HCC)    7. OSA (obstructive sleep apnea)    8. Current chronic use of systemic steroids    9. Encounter for screening mammogram for malignant neoplasm of breast    10. Nonadherence to medical treatment      Orders Placed This Encounter   Procedures    Mammo Screening Bilateral    Albumin/creatinine urine ratio    Comprehensive Metabolic Panel    Lipid Panel    Hemoglobin A1c    CBC    Ambulatory referral to Ophthalmology     Labs ordered as appropriate. Lifestyle issues discussed (including diet/sleep hygiene/exercise/stress management/sexual health/substance use as appropriate), and regular dental and eye exams were encouraged.  Immunizations and health maintenance (mammogram) all reviewed and ordered as indicated in plan.  Signs and symptoms that should prompt return sooner than scheduled were reviewed with the patient.    Unfortunately, Mrs. Dux has stopped nearly all of her medications for unclear reasons but seems to stem from dissatisfaction/frustration with her rheumatologic treatment.  We had a very frank discussion today about her forward compliance with recommendations.  She seems amenable to this and will work on getting back onto her regular medications.    Cardiometabolic: Multiple chronic cardiometabolic problems, previously well-controlled but now non-compliant with most of her medications. Strongly recommend restarting medications, though she is now on Medicare and is difficult to ascertain the costs.  I reiterated the importance of aspirin indefinitely given her CAD history.  Regarding Ozempic, would start at half dose 0.5mg  daily for 1 month then increase to 1.0mg  daily. Labs today.  Follow-up in 6 months or earlier if needed.     PMR: Continue follow-up with rheumatology, though she reports she is switching to an outside rheumatologist because of her frustration with [facility] rheumatology's recommendations.  I tried to clarify that they believe some of her pain is not coming from her underlying autoimmune disease, not that she does not have an autoimmune disease.  She has unfortunately stopped her methotrexate so I recommend stopping folic acid for now as well while she is off of methotrexate.  She will establish with a new rheumatologist in about 2 months.  We did discuss switching from sertraline to duloxetine to hopefully help with some of her pain.      Note: Today's visit addressed both an Annual Preventive Exam as documented as well as the separately identifiable problem-based evaluation and management of the diagnoses noted above.      HPI     Chief Complaint   Patient presents with    Follow-up     Madison Harris is a 67 y.o. female in today for an annual exam and follow-up of multiple chronic medical problems.      Cardiometabolic: CAD s/p PCI, hypertension, hyperlipidemia, T2DM  3 DES 2012 LAD, L PDA 2018 OM 3, statin intolerance.  She is taking Olmesartan, Carvedilol and +/- Farxiga.  She has stopped her ASA, Ozempic, and PCSK9i.  She denies chest pain, SOB, DOE, orthopnea, PND, pre-syncope or syncope.  Last A1c 02/2022, 6.3%.  OSA intolerant of  CPAP.  AHI 60s.    PMR: Follows with rheumatology on methotrexate, last visit 8 month ago.  Started Actemra 06/2021 but stopped in Fall because no help.  She has stopped methotrexate despite Rheumatology asking her to continue.  She is not taking prednisone either.    I have reviewed and updated the past medical, social, and family history today and updated them in the EMR.    Allergies   Allergen Reactions    Pravastatin Muscle Pain     Other Reaction: myalgia     Current Outpatient Medications   Medication Instructions aspirin (ECOTRIN) 81 mg    blood sugar diagnostic (ACCU-CHEK GUIDE TEST STRIPS) Strp Use to check blood sugar once daily (fasting)    blood-glucose meter kit Check blood sugar daily.    blood-glucose meter Misc Use once daily to check blood sugar as directed by your physician    carvediloL (COREG) 12.5 MG tablet Take 1 tablet (12.5 mg total) by mouth 2 (two) times a day with meals.    dapagliflozin propanediol (FARXIGA) 10 mg, Oral, Every morning    DULoxetine (CYMBALTA) 60 mg, Oral, Daily (standard)    evolocumab (REPATHA SURECLICK) 140 mg/mL PnIj Inject the contents of one pen (140 mg) under the skin every fourteen (14) days.    lancets Misc Use as directed    lancets Misc Use to check blood sugar once daily (fasting)    nitroglycerin (NITROSTAT) 0.4 MG SL tablet PLACE 1 TABLET UNDER THE TONGUE EVERY 5 MINUTES AS NEEDED FOR CHEST PAIN UP TO 3 DOSES THEN CALL 911 IF NO RELIEF    olmesartan (BENICAR) 40 mg, Oral, Daily (standard)    OZEMPIC 1 mg, Subcutaneous, Every 7 days    pen needle, diabetic 31 gauge x 5/16 (8 mm) Ndle Use 1-3 times daily as directed.    safety needles 25 x 5/8  Ndle Use once a week for 4 weeks for b12 injections then once a month after 4 weeks    syringe with needle (BD LUER-LOK SYRINGE) 3 mL 25 gauge x 1 Syrg Use once a week for 4 weeks for b12 injections then once a month after 4 weeks       Medical History Surgical History   Past Medical History:   Diagnosis Date    Cataract     Diabetes mellitus (CMS-HCC)     Type 2     Hyperlipidemia     Hypertension     Past Surgical History:   Procedure Laterality Date    CARDIAC SURGERY      2 stents.     HYSTERECTOMY      PR COLONOSCOPY FLX DX W/COLLJ SPEC WHEN PFRMD N/A 09/10/2020    Procedure: COLONOSCOPY, FLEXIBLE, PROXIMAL TO SPLENIC FLEXURE; DIAGNOSTIC, W/WO COLLECTION SPECIMEN BY BRUSH OR WASH;  Surgeon: Pasty Arch, MD;  Location: [facility] PROCED[facility];  Service: Gastroenterology      Family History Social History   Family History Problem Relation Age of Onset    Cancer Mother     Cancer Brother     Cancer Maternal Grandmother     Diabetes Maternal Grandmother     Heart disease Maternal Grandmother     Hypertension Maternal Grandmother     Ovarian cancer Neg Hx     Breast cancer Neg Hx     Social History     Socioeconomic History    Marital status: Married   Occupational History    Occupation: IT consultant  Tobacco Use    Smoking status: Never    Smokeless tobacco: Never   Vaping Use    Vaping status: Never Used   Substance and Sexual Activity    Alcohol use: Yes     Comment: 2 per night    Drug use: Never     Social Determinants of Health     Food Insecurity: No Food Insecurity (11/24/2022)    Hunger Vital Sign     Worried About Running Out of Food in the Last Year: Never true     Ran Out of Food in the Last Year: Never true          EXAM & DATA   T   - P 54 - RR   - SpO2 96%  BP   BP Readings from Last 3 Encounters:   11/24/22 118/79   04/06/22 120/57   10/28/21 114/61     Wt   Wt Readings from Last 3 Encounters:   11/24/22 68.3 kg (150 lb 9.6 oz)   04/06/22 68 kg (150 lb)   10/28/21 68.2 kg (150 lb 6.4 oz)       Physical Exam  Vitals reviewed.   Constitutional:       General: She is not in acute distress.     Appearance: She is well-developed.   HENT:      Head: Normocephalic and atraumatic.   Eyes:      General: No scleral icterus.     Conjunctiva/sclera: Conjunctivae normal.      Pupils: Pupils are equal, round, and reactive to light.   Pulmonary:      Effort: Pulmonary effort is normal. No respiratory distress.   Musculoskeletal:      Cervical back: Normal range of motion and neck supple.   Neurological:      Mental Status: She is alert and oriented to person, place, and time.      Coordination: Coordination normal.   Psychiatric:         Behavior: Behavior normal.         Thought Content: Thought content normal.         Judgment: Judgment normal.         PHQ-2 Score:    PHQ-9 Score:    Screening complete, no depression identified / no further action needed today    Data Reviewed:  Lab Results   Component Value Date    NA 141 03/16/2022    K 4.3 03/16/2022    BUN 18 03/16/2022    CREATININE 0.86 03/16/2022    A1C 6.3 (H) 03/16/2022    CHOL 149 03/16/2022    LDL 51 03/16/2022    HDL 74 (H) 03/16/2022    TRIG 119 03/16/2022    ALT 35 03/16/2022     Lab Results   Component Value Date    A1C 6.3 (H) 03/16/2022    A1C 6.5 (H) 04/26/2021    A1C 6.7 (H) 12/02/2020    A1C 6.7 (H) 11/02/2020    A1C 8.9 (H) 08/10/2020    A1C 8.0 (H) 04/06/2020    A1C 7.1 (H) 07/26/2019    A1C 7.8 (H) 04/25/2019    A1C 9.0 (H) 01/23/2019    A1C 7.9 (H) 11/14/2018     Lab Results   Component Value Date    TRIG 119 03/16/2022    TRIG 89 04/26/2021    TRIG 130 11/02/2020    TRIG 87 08/10/2020    TRIG 144 04/06/2020  TRIG 120 07/26/2019    TRIG 131 07/25/2018     Lab Results   Component Value Date    LDL 51 03/16/2022    LDL 53 04/26/2021    LDL 37 (L) 11/02/2020    LDL 46 08/10/2020    LDL 27 (L) 04/06/2020    LDL 21 07/26/2019    LDL 38.8 07/25/2018     Lab Results   Component Value Date    ALT 35 03/16/2022    ALT 25 10/21/2021    ALT 19 07/15/2021    ALT 19 04/26/2021    ALT 21 12/02/2020    ALT 19 11/02/2020    ALT 23 08/10/2020    ALT 20 04/06/2020    ALT 14 11/06/2019    ALT 17 07/26/2019     No results found for: APOB  No results found for: HSCRP  Lab Results   Component Value Date    ALBCRERAT 85.5 (H) 03/16/2022    ALBCRERAT 22.5 04/26/2021    ALBCRERAT 7.7 11/02/2020    ALBCRERAT  08/10/2020      Comment:      Unable to calculate due to value below lower limit of assay linearity.    ALBCRERAT 14.3 04/06/2020    ALBCRERAT 20.9 07/26/2019    ALBCRERAT 15.6 07/25/2018     Lab Results   Component Value Date    WBC 5.9 11/24/2022    HGB 14.3 11/24/2022    PLT 176 11/24/2022    MCV 96.0 11/24/2022    RDW 15.1 (H) 11/24/2022     No results found for: FERRITIN, TIBC, IRON  Lab Results   Component Value Date    TSH 0.430 10/03/2019     Lab Results   Component Value Date    VITDTOTAL 27.4 04/06/2022    VITDTOTAL 33.6 10/21/2021    VITDTOTAL 29.8 11/18/2020    VITDTOTAL 31.7 08/27/2020    VITDTOTAL 25.0 11/06/2019       No results found for: HMPAP, HPV  Mammogram date: 07/26/2019    Pathology & Recent Imaging:  No results found for: FINALDX  No results found.  ----------------------------------------------------------------------------------------------------------------------  This note was dictated with Dragon Medical and may contain typos missed during proofreading.

## 2022-11-24 NOTE — Unmapped (Addendum)
I have ordered a mammogram for you today.  You can call  [phone] to schedule at any of the following locations:    [facility]   [address], [place], [place] [address]    [facility]  [address], [place], [place] [address]    [facility]  [address] [place], [place] [address]    [facility]  [address] [place], [place] [address]    [facility]  [address] [place], [place] [address]

## 2022-11-25 LAB — HEMOGLOBIN A1C
ESTIMATED AVERAGE GLUCOSE: 214 mg/dL
HEMOGLOBIN A1C: 9.1 % — ABNORMAL HIGH (ref 4.9–6.1)

## 2022-12-23 ENCOUNTER — Ambulatory Visit: Admit: 2022-12-23 | Discharge: 2022-12-24 | Payer: MEDICARE

## 2022-12-23 DIAGNOSIS — E119 Type 2 diabetes mellitus without complications: Principal | ICD-10-CM

## 2022-12-23 DIAGNOSIS — H3589 Other specified retinal disorders: Principal | ICD-10-CM

## 2022-12-23 DIAGNOSIS — Z794 Long term (current) use of insulin: Principal | ICD-10-CM

## 2022-12-23 NOTE — Unmapped (Signed)
Ophthalmology Retina Clinic    Initial History of present illness:     Madison Harris is a 67 y.o. female , referred by Virgilio Frees* for evaluation of Diabetic Eye Exam.   Vision is blurred for near and is blurred for distance (Glasses no longer helping).  Associated symptoms include blurred vision.  Diabetes characteristics include Type 2 and on insulin.  Duration of 10 years.  Blood sugar level fluctuates.    Comments   Patient presents for Diabetic Eye Exam. Denies flashes and floaters, pain or discomfort.     Lab Results       Component                Value               Date                       A1C                      9.1 (H)             11/24/2022               has a past medical history of Cataract, Diabetes mellitus (CMS-HCC), Hyperlipidemia, and Hypertension.     Assessment and plan:     # diabetes type 2  Hbg A1c: 9.1 (11/24/2022), 6.3 (03/16/2022)  The patient was advised to maintain tight blood pressure control, favorable levels of cholesterol and tight glucose control with Hbg A1c <7%.   12/23/2022 First visit KLW    First visit today KLW. The patient has diabetes without any evidence of retinopathy.        Follow up one year with OCT, sooner PRN       # Cataracts both eyes   mild   Observe        Copy to :   Referring provider   Primary Care Provider Livingston Diones, MD    INTERPRETATION EXTENDED OPHTHALMOSCOPY MACULA (90Dlens)  Macula - right:  Vitreous syneresis, Normal  Macula - left:  Vitreous syneresis, Normal         12/23/2022   I saw and evaluated the patient, participating in the key portions of the service.  I reviewed the resident???s note.  I agree with the resident???s findings and plan including interpretation of images.

## 2023-02-14 NOTE — Unmapped (Signed)
DATE:  02/14/23  PATIENT MRN:  [id number]  PATIENT NAME: Lachelle, Sprowl  PATIENT DOB:  October 04, 1955  REFERRAL ID:  [id number]  ORDER DESCRIPTION:  Emg  REFERRAL SPECIALTY:  None Specified / External Referral           Dear Staci Righter,     We recently received a request for an evaluation for [facility].  After multiple attempts to contact her, we have been unable to connect with the patient to schedule an appointment.     If there is a continuing medical need for her to be evaluated, please have your office first confirm her agreement with this plan, then instruct patient to contact our office. This referral order will be left open for 6 months. It would be helpful for someone in your office to confirm and update the patient's contact information.     Thank you for referring your patients to [facility], and please let us know if further assistance is needed.        Sincerely,  [facility]

## 2023-04-20 NOTE — Unmapped (Signed)
 [place] [facility] ([facility]-- Medication Adherence  CHART REVIEW    To patients reading this note:  Based on information provided by your insurance, the primary purpose of this chart review is to determine the need and/or appropriateness of statin therapy. Any suggested changes are intended to enhance the overall care you receive.      Population:  AETNA-[place]    A chart review was performed based on patient's history of diabetes  and third party payer claims data.     Based on chart review, the patient meets the following exclusion criteria:   Patient has well documented intolerance to statin therapy.          Owens Loffler, CPP , PharmD  [place] [facility] ([facility])

## 2023-04-27 NOTE — Unmapped (Signed)
[place] [facility] ([facility]  UNREACHABLE    Madison Harris is a 67 y.o. female identified as possibly having a proportion of days covered of less than 80%  for a diabetes medication , based on payer reports and chart review.       1st attempt call made to the patient's Cell number(s). There was no answer & unable to leave a voicemail.       Population:  AETNA-[place]    Medication(s):    Diabetes: dapagliflozin [name]) 10mg    Last Filled on 11/24/2022 for a 90 day supply  Refills remaining:YES    Additional Details & Actions Taken:   MyChart message sent      Aldona Bar , CPhT  Certified Pharmacy Technician  [place] [facility] ([facility])  Phone: [phone]

## 2023-05-25 MED ORDER — DAPAGLIFLOZIN PROPANEDIOL 10 MG TABLET
ORAL_TABLET | Freq: Every morning | ORAL | 6 refills | 30 days | Status: CP
Start: 2023-05-25 — End: ?

## 2023-05-25 NOTE — Unmapped (Signed)
Pt tried to fill her dapagliflozin propanediol (FARXIGA) 10 mg Tab tablet, but the 90-day refill would have had a copay of $429. Pt would like to have a 30-day supply instead to save money. The pharmacy told her to request a new prescription for 30 days.    Pt requests new prescription be sent to:     [facility] - [place], [place] - [address] [facility] AT Portal to Registered [address]  [place], [place] [address]  Phone: [phone]  Fax: [phone]     Pt is available for call back at [phone] if needed, please and thanks! -Eber Jones

## 2023-05-25 NOTE — Unmapped (Signed)
Patient is requesting the following refill  Requested Prescriptions     Pending Prescriptions Disp Refills    dapagliflozin propanediol (FARXIGA) 10 mg Tab tablet 90 tablet 3     Sig: Take 1 tablet (10 mg total) by mouth every morning.       Recent Visits  Date Type Provider Dept   11/24/22 Office Visit Livingston Diones, MD [facility] Internal Medicine   Showing recent visits within past 365 days and meeting all other requirements  Future Appointments  Date Type Provider Dept   05/29/23 Appointment Livingston Diones, MD [facility] Internal Medicine   Showing future appointments within next 365 days and meeting all other requirements       Labs: A1c:   Hemoglobin A1C (%)   Date Value   11/24/2022 9.1 (H)   01/23/2019 9.0 (H)    Creatinine:   Creatinine (mg/dL)   Date Value   16/03/9603 0.93   07/25/2018 1.2

## 2023-05-29 ENCOUNTER — Ambulatory Visit: Admit: 2023-05-29 | Discharge: 2023-05-30 | Attending: Internal Medicine | Primary: Internal Medicine

## 2023-05-29 DIAGNOSIS — M353 Polymyalgia rheumatica: Principal | ICD-10-CM

## 2023-05-29 DIAGNOSIS — E782 Mixed hyperlipidemia: Principal | ICD-10-CM

## 2023-05-29 DIAGNOSIS — Z23 Encounter for immunization: Principal | ICD-10-CM

## 2023-05-29 DIAGNOSIS — E1165 Type 2 diabetes mellitus with hyperglycemia: Principal | ICD-10-CM

## 2023-05-29 DIAGNOSIS — I10 Essential (primary) hypertension: Principal | ICD-10-CM

## 2023-05-29 DIAGNOSIS — Z1231 Encounter for screening mammogram for malignant neoplasm of breast: Principal | ICD-10-CM

## 2023-05-29 DIAGNOSIS — Z794 Long term (current) use of insulin: Principal | ICD-10-CM

## 2023-05-29 DIAGNOSIS — I2510 Atherosclerotic heart disease of native coronary artery without angina pectoris: Principal | ICD-10-CM

## 2023-05-29 LAB — COMPREHENSIVE METABOLIC PANEL
ALBUMIN/GLOBULIN RATIO: 1.1 (ref 1.1–2.2)
ALBUMIN: 3.8 g/dL (ref 3.5–5.0)
ALKALINE PHOSPHATASE: 92 U/L (ref 50–136)
ALT (SGPT): 30 U/L (ref 12–78)
ANION GAP: 12 mmol/L
AST (SGOT): 18 U/L (ref 7–31)
BILIRUBIN TOTAL: 0.9 mg/dL (ref 0.0–1.5)
BLOOD UREA NITROGEN: 21 mg/dL (ref 5–26)
BUN / CREAT RATIO: 20 (ref 12–25)
CALCIUM: 9.8 mg/dL (ref 8.5–10.5)
CHLORIDE: 96 mmol/L (ref 95–110)
CO2: 25.6 mmol/L (ref 21.0–31.0)
CREATININE: 1.03 mg/dL (ref 0.50–1.50)
EGFR CKD-EPI (2021) FEMALE: 60 mL/min/{1.73_m2} (ref >=60)
GLOBULIN, TOTAL: 3.6 g/dL (ref 1.5–4.3)
GLUCOSE RANDOM: 233 mg/dL — ABNORMAL HIGH (ref 60–100)
POTASSIUM: 4 mmol/L (ref 3.5–5.1)
PROTEIN TOTAL: 7.4 g/dL (ref 6.4–8.3)
SODIUM: 134 mmol/L — ABNORMAL LOW (ref 136–145)

## 2023-05-29 LAB — LIPID PANEL
CHOLESTEROL/HDL RATIO SCREEN: 4.2 (ref 0.0–4.5)
CHOLESTEROL: 253 mg/dL — ABNORMAL HIGH (ref 0–200)
HDL CHOLESTEROL: 60 mg/dL (ref 40–60)
LDL CHOLESTEROL CALCULATED: 158 mg/dL — ABNORMAL HIGH (ref 30–130)
NON-HDL CHOLESTEROL: 193 mg/dL
TRIGLYCERIDES: 177 mg/dL — ABNORMAL HIGH (ref <=150)
VLDL CHOLESTEROL CAL: 35.4 mg/dL (ref 25–40)

## 2023-05-29 LAB — VITAMIN B12: VITAMIN B-12: 268 pg/mL (ref 211–911)

## 2023-05-29 MED ORDER — EZETIMIBE 10 MG TABLET
ORAL_TABLET | Freq: Every day | ORAL | 3 refills | 90.00 days | Status: CP
Start: 2023-05-29 — End: 2024-05-28

## 2023-05-29 NOTE — Unmapped (Signed)
BP at goal today.

## 2023-05-29 NOTE — Unmapped (Addendum)
Needs to labs today.  Unfortunately, she stopped Ozempic within the past year, is now on medicare and likely not covered under her current drug plan.  She was intolerant to metformin (diarrhea).  Will refer to [facility] to help with medication management.  Continue Farxiga 10mg  daily (quite expensive for her as well).

## 2023-05-29 NOTE — Unmapped (Signed)
BP controlled.  Lipid and glycemic control as above.  Continue ASA 81mg  daily.

## 2023-05-29 NOTE — Unmapped (Signed)
Better controlled now with Methotrexate.  Try to avoid prednisone as much as able.

## 2023-05-29 NOTE — Unmapped (Addendum)
Needs to get back on Repatha - limited by drug plan.  Will start Ezetimibe 10mg  daily today and have her meet with [facility] clinic to discuss alternatives (ideally needs to get back on Repatha but may not be an option until she can change drug plan next year)

## 2023-05-29 NOTE — Unmapped (Signed)
[facility] INTERNAL MEDICINE  [address] [name]  [name] [address]  Ph: [phone]  Assessment & Plan  Essential hypertension  BP at goal today.   Type 2 diabetes mellitus with hyperglycemia, with long-term current use of insulin (CMS-HCC)  Needs to labs today.  Unfortunately, she stopped Ozempic within the past year, is now on medicare and likely not covered under her current drug plan.  She was intolerant to metformin (diarrhea).  Will refer to [facility] to help with medication management.  Continue Farxiga 10mg  daily (quite expensive for her as well).  PMR (polymyalgia rheumatica) (CMS-HCC)  Better controlled now with Methotrexate.  Try to avoid prednisone as much as able.  Mixed hyperlipidemia  Needs to get back on Repatha - limited by drug plan.  Will start Ezetimibe 10mg  daily today and have her meet with [facility] clinic to discuss alternatives (ideally needs to get back on Repatha but may not be an option until she can change drug plan next year)  Encounter for screening mammogram for malignant neoplasm of breast  Now 3rd time I've ordered this for her.  Reminded today she is due.   CAD s/p PCI  BP controlled.  Lipid and glycemic control as above.  Continue ASA 81mg  daily.     Orders Placed This Encounter   Procedures    INFLUENZA VACCINE IIV3(IM)(PF)6 MOS UP       HPI     Chief Complaint   Patient presents with    Follow-up     Madison Harris is a 67 y.o. female in today for 6 month follow-up of multiple chronic medical problems.    T2DM  CAD status post PCI  Dyslipidemia  OSA  PMR  At last visit, she was not taking meds for DM.     Ozempic - not taking  Repatha - not taking  [name] - 10mg  once daily ($143/30 days)  ASA 81once daily  Olmesartan 40 mg once daily  Cymbalta 60mg  daily  Carvedilolol 12.5mg  once daily     Now on methotrexate IM, feels like things doing much better.  She was on steroids initially.  Has been so so much better.    Not checking BG regularly, but thinks her BG is better.    I have reviewed and updated the past medical, social, and family history today and updated them in the EMR.    EXAM & DATA   T   - P 62 - RR   - SpO2 97%  BP   BP Readings from Last 3 Encounters:   05/29/23 102/62   11/24/22 118/79   04/06/22 120/57     Wt   Wt Readings from Last 3 Encounters:   11/24/22 68.3 kg (150 lb 9.6 oz)   04/06/22 68 kg (150 lb)   10/28/21 68.2 kg (150 lb 6.4 oz)     Data Reviewed:  Lab Results   Component Value Date    NA 137 11/24/2022    K 4.4 11/24/2022    BUN 26 11/24/2022    CREATININE 0.93 11/24/2022    A1C 9.1 (H) 11/24/2022    CHOL 252 (H) 11/24/2022    LDL 151 (H) 11/24/2022    HDL 74 (H) 11/24/2022    TRIG 134 11/24/2022    ALT 33 11/24/2022       Lab Results   Component Value Date    A1C 9.1 (H) 11/24/2022    A1C 6.3 (H) 03/16/2022  A1C 6.5 (H) 04/26/2021    A1C 6.7 (H) 12/02/2020    A1C 6.7 (H) 11/02/2020    A1C 8.9 (H) 08/10/2020    A1C 8.0 (H) 04/06/2020    A1C 7.1 (H) 07/26/2019    A1C 7.8 (H) 04/25/2019    A1C 9.0 (H) 01/23/2019     No results found for: INSULIN  Lab Results   Component Value Date    TRIG 134 11/24/2022    TRIG 119 03/16/2022    TRIG 89 04/26/2021    TRIG 130 11/02/2020    TRIG 87 08/10/2020    TRIG 144 04/06/2020    TRIG 120 07/26/2019    TRIG 131 07/25/2018     Lab Results   Component Value Date    LDL 151 (H) 11/24/2022    LDL 51 03/16/2022    LDL 53 04/26/2021    LDL 37 (L) 11/02/2020    LDL 46 08/10/2020    LDL 27 (L) 04/06/2020    LDL 21 07/26/2019    LDL 38.8 07/25/2018     Lab Results   Component Value Date    ALT 33 11/24/2022    ALT 35 03/16/2022    ALT 25 10/21/2021    ALT 19 07/15/2021    ALT 19 04/26/2021    ALT 21 12/02/2020    ALT 19 11/02/2020    ALT 23 08/10/2020    ALT 20 04/06/2020    ALT 14 11/06/2019     No results found for: APOB  No results found for: HSCRP  Lab Results   Component Value Date    ALBCRERAT 35.9 (H) 11/24/2022    ALBCRERAT 85.5 (H) 03/16/2022    ALBCRERAT 22.5 04/26/2021    ALBCRERAT 7.7 11/02/2020    ALBCRERAT  08/10/2020      Comment:      Unable to calculate due to value below lower limit of assay linearity.    ALBCRERAT 14.3 04/06/2020    ALBCRERAT 20.9 07/26/2019    ALBCRERAT 15.6 07/25/2018     No results found for: FERRITIN, VITD      ----------------------------------------------------------------------------------------------------------------------  This note was dictated with Dragon Medical and may contain typos missed during proofreading.

## 2023-05-30 LAB — HEMOGLOBIN A1C
ESTIMATED AVERAGE GLUCOSE: 249 mg/dL
HEMOGLOBIN A1C: 10.3 % — ABNORMAL HIGH (ref 4.9–6.1)

## 2023-06-29 ENCOUNTER — Encounter: Admit: 2023-06-29 | Discharge: 2023-06-30 | Payer: MEDICARE | Attending: Ambulatory Care | Primary: Ambulatory Care

## 2023-06-29 DIAGNOSIS — Z794 Long term (current) use of insulin: Principal | ICD-10-CM

## 2023-06-29 DIAGNOSIS — E1165 Type 2 diabetes mellitus with hyperglycemia: Principal | ICD-10-CM

## 2023-06-29 DIAGNOSIS — E782 Mixed hyperlipidemia: Principal | ICD-10-CM

## 2023-06-29 NOTE — Unmapped (Signed)
Hyperlipidemia  Patient reported treatment: ezetimibe 10 mg     Assessment / Plan  The ASCVD Risk score (Arnett DK, et al., 2019) failed to calculate.  Lab Results   Component Value Date    LDL 158 (H) 05/29/2023    TRIG 177 (H) 05/29/2023     See above, will have patient attempt to enroll in patient assistance program for Repatha   _____________________________________________________

## 2023-06-29 NOTE — Unmapped (Addendum)
Patient reported treatment: dapagliflozin Madison Harris) 10 mg daily   Previous treatments: GLP-1 agonists (cost), metformin (side effects)     Assessment / Plan  Lab Results   Component Value Date    A1C 10.3 (H) 05/29/2023    A1C 9.1 (H) 11/24/2022    A1C 6.3 (H) 03/16/2022     Currently uncontrolled. Goal A1c <7% per ADA guidelines.    Cost is a concern for patients medications. Still taking [name].   Reviewed with patient options for affordability. Inflation Reduction Act limits out of pocket cost to a max of $2000. With combination of Farxiga, Ozempic, and Repatha, patient appears to hit out of pocket maximum for plan very quickly (by March - assuming all 3 medications filled each month). She changed to an [facility] Advantage plan but new card hasn't been uploaded into system yet to verify which specific plan she has. Medicare Prescription Payment Plan could also benefit patient as this would spread out the cost of the medication over the course of the year compared to having it all due in one - three months time span   Also reviewed patient assistance programs for medications with patient. Reviewed application process and each drug would come from the respective drug manufacturer and require a separate application. Patient feels that she would qualify for each program based on household income (60 - 80,000 annually for a household of 2).  She would like to move forward with applying for patient assistance programs for [place], Franki Monte, Repatha.   Sent blank applications to patient via MyChart and mail. She will fill out her sections and return to pharmacist via email or fax to have provider sections completed. Will attempt to have medications sent to [facility] so that medications can be mailed to patient instead of having to come to office to get them.        _____________________________________________________

## 2023-06-29 NOTE — Unmapped (Signed)
Patient ID: Madison Harris is a 68 y.o. female     Assessment/Plan:      Assessment & Plan  Type 2 diabetes mellitus with hyperglycemia, with long-term current use of insulin (CMS-HCC)  Patient reported treatment: dapagliflozin Madison Harris) 10 mg daily    Previous treatments: GLP-1 agonists (cost), metformin (side effects)     Assessment / Plan  Lab Results   Component Value Date    A1C 10.3 (H) 05/29/2023    A1C 9.1 (H) 11/24/2022    A1C 6.3 (H) 03/16/2022     Currently uncontrolled. Goal A1c <7% per ADA guidelines.    Cost is a concern for patients medications. Still taking [name].   Reviewed with patient options for affordability. Inflation Reduction Act limits out of pocket cost to a max of $2000. With combination of Farxiga, Ozempic, and Madison Harris, patient appears to hit out of pocket maximum for plan very quickly (by March - assuming all 3 medications filled each month). She changed to an Madison Harris plan but new card hasn't been uploaded into system yet to verify which specific plan she has. Medicare Prescription Payment Plan could also benefit patient as this would spread out the cost of the medication over the course of the year compared to having it all due in one - three months time span   Also reviewed patient assistance programs for medications with patient. Reviewed application process and each drug would come from the respective drug manufacturer and require a separate application. Patient feels that she would qualify for each program based on household income (60 - 80,000 annually for a household of 2).  She would like to move forward with applying for patient assistance programs for Madison Harris, Madison Harris, Madison Harris.   Sent blank applications to patient via MyChart and mail. She will fill out her sections and return to pharmacist via email or fax to have provider sections completed. Will attempt to have medications sent to Madison Harris so that medications can be mailed to patient instead of having to come to office to get them.        _____________________________________________________     Mixed hyperlipidemia  Hyperlipidemia  Patient reported treatment: ezetimibe 10 mg     Assessment / Plan  The ASCVD Risk score (Arnett DK, et al., 2019) failed to calculate.  Lab Results   Component Value Date    LDL 158 (H) 05/29/2023    TRIG 177 (H) 05/29/2023     See above, will have patient attempt to enroll in patient assistance program for Madison Harris   _____________________________________________________     Follow-up  1 month, or as needed in between visits     [facility] Components:     Medication adherence and barriers to the treatment plan have been addressed. Opportunities to optimize healthy behaviors have been discussed. Patient / caregiver voiced understanding.        Future Appointments   Date Time Provider Department Center   07/04/2023 10:40 AM [facility] 2 [facility] [place] [facility]   07/27/2023 10:30 AM PHARMACIST [facility]   09/27/2023 11:00 AM Madison Harris, Madison Fanning, MD [facility]   12/25/2023 10:00 AM Madison Harris, Madison Peri, MD [facility]          >50% of this 31 minute visit spent in direct pt care, including counseling.      The patient reports they are physically located in [place] and is currently: at home. I conducted a phone  visit.  I spent 34 minutes on the phone call with the patient on the date of service .       Notes from this visit will be sent via Epic to patient's provider: Dr. Gaye Harris Harris, BCPS, BCACP, BC-ADM  Clinical Pharmacist- [place] [facility] ([facility])  Direct Line: [phone] -  C[facility] Clinic: [phone] - Fax: [phone]    Subjective:     HPI: Madison Harris is a 68 y.o. female who presents for diabetes management.      Adherence and Lifestyle  Adherence: She reports missed doses of Madison Harris, Ozempic. Reason(s) for nonadherence:   Cost concerns Objective:     Immunization History   Administered Date(s) Administered    COVID-19 VACCINE,MRNA(MODERNA)(PF) 08/16/2019, 09/13/2019, 05/19/2020, 08/27/2020    INFLUENZA INJ MDCK PF, QUAD,(FLUCELVAX)(76MO AND UP EGG FREE) 04/06/2020, 04/26/2021    INFLUENZA TIV (TRI) PF (IM)(HISTORICAL) 04/26/2013    INFLUENZA VACCINE IIV3(IM)(PF)6 MOS UP 05/29/2023    Influenza Vaccine Quad(IM)6 MO-Adult(PF) 04/26/2013, 04/25/2014, 04/22/2015, 07/25/2018, 04/25/2019, 03/16/2022    Influenza Virus Vaccine, unspecified formulation 04/26/2013, 04/25/2014, 04/22/2015       Lab Results   Component Value Date    Hemoglobin A1C 10.3 (H) 05/29/2023    Hemoglobin A1C 9.1 (H) 11/24/2022    Hemoglobin A1C 6.3 (H) 03/16/2022    Hemoglobin A1C 9.0 (H) 01/23/2019    Hemoglobin A1C 8.4 (H) 07/25/2018    Creat U 41.8 11/24/2022      Lab Results   Component Value Date    Cholesterol 253 (H) 05/29/2023    Cholesterol, Total 127 07/25/2018    HDL 60 05/29/2023    HDL 62 (H) 07/25/2018    LDL calculated 38.8 07/25/2018    LDL Calculated 158 (H) 05/29/2023      The 10-yr ASCVD Risk score Denman George DC Jr., et al., 2013) failed to calculate due to the following reason:  The 2013 10-yr ASCVD risk score is only valid if the patient does not have prior/existing clinical ASCVD (myocardial infarction, stroke, CABG, coronary angioplasty, angina or peripheral arterial disease, coronary atherosclerosis, ischemic heart disease, or cerebrovascular disease). The patient has prior/existing ASCVD.  Has Coronary Atherosclerosis    There were no vitals filed for this visit.   Lab Results   Component Value Date    BUN 21 05/29/2023    BUN 16.00 07/25/2018    Creatinine 1.03 05/29/2023    Creatinine 1.2 07/25/2018    Sodium 134 (L) 05/29/2023    Sodium 138 07/25/2018    Potassium 4.0 05/29/2023    Potassium 4.5 07/25/2018       Current Outpatient Medications   Medication Sig Dispense Refill    aspirin (ECOTRIN) 81 MG tablet Take 1 tablet (81 mg total) by mouth.      blood sugar diagnostic (ACCU-CHEK GUIDE TEST STRIPS) Strp Use to check blood sugar once daily (fasting) 100 each 3    blood-glucose meter kit Check blood sugar daily. 1 each 0    blood-glucose meter Misc Use once daily to check blood sugar as directed by your physician 1 each 0    carvediloL (COREG) 12.5 MG tablet Take 1 tablet (12.5 mg total) by mouth 2 (two) times a day with meals. 180 tablet 1    dapagliflozin propanediol (FARXIGA) 10 mg Tab tablet Take 1 tablet (10 mg total) by mouth every morning. 30 tablet 6    DULoxetine (CYMBALTA) 60 MG capsule Take 1 capsule (  60 mg total) by mouth daily. 90 capsule 3    evolocumab (Madison Harris SURECLICK) 140 mg/mL PnIj Inject the contents of one pen (140 mg) under the skin every fourteen (14) days. (Patient not taking: Reported on 05/29/2023) 6 mL 0    ezetimibe (ZETIA) 10 mg tablet Take 1 tablet (10 mg total) by mouth daily. 90 tablet 3    folic acid (FOLVITE) 1 MG tablet Take 1 tablet (1 mg total) by mouth daily.      lancets Misc Use as directed 200 each 2    lancets Misc Use to check blood sugar once daily (fasting) 100 each 4    nitroglycerin (NITROSTAT) 0.4 MG SL tablet PLACE 1 TABLET UNDER THE TONGUE EVERY 5 MINUTES AS NEEDED FOR CHEST PAIN UP TO 3 DOSES THEN CALL 911 IF NO RELIEF      olmesartan (BENICAR) 40 MG tablet Take 1 tablet (40 mg total) by mouth daily. 90 tablet 4    pen needle, diabetic 31 gauge x 5/16 (8 mm) Ndle Use 1-3 times daily as directed. 100 each 3    safety needles 25 x 5/8  Ndle Use once a week for 4 weeks for b12 injections then once a month after 4 weeks 100 each 3    semaglutide (OZEMPIC) 1 mg/dose (4 mg/3 mL) PnIj injection Inject 1 mg under the skin every seven (7) days. 9 mL 1    syringe with needle (BD LUER-LOK SYRINGE) 3 mL 25 gauge x 1 Syrg Use once a week for 4 weeks for b12 injections then once a month after 4 weeks 100 each 2     No current facility-administered medications for this visit.

## 2023-07-04 ENCOUNTER — Ambulatory Visit: Admit: 2023-07-04 | Payer: MEDICARE

## 2023-07-14 DIAGNOSIS — Z794 Long term (current) use of insulin: Principal | ICD-10-CM

## 2023-07-14 DIAGNOSIS — E1165 Type 2 diabetes mellitus with hyperglycemia: Principal | ICD-10-CM

## 2023-07-14 DIAGNOSIS — E782 Mixed hyperlipidemia: Principal | ICD-10-CM

## 2023-07-14 NOTE — Unmapped (Signed)
Forms for Farxiga and Ozempic completed by patient and provider. Faxed to respective patient assistance foundations

## 2023-07-14 NOTE — Unmapped (Signed)
[place] [facility] ([facility]) [facility]  Embedded Clinic Visit      Name: Madison Harris  Date of Birth: 1956/02/23  Today's Date: 07/14/2023   Age: 68 y.o.      Patient Summary    Visit Type: Documentation Only, Time spent with patient: 0    Assessment & Plan  Type 2 diabetes mellitus with hyperglycemia, with long-term current use of insulin (CMS-HCC)  Forms for Farxiga and Ozempic completed by patient and provider. Faxed to respective patient assistance foundations   Mixed hyperlipidemia  Form for Repatha completed by patient and provider. Faxed to respective patient assistance foundation    Follow- up  2/6 as scheduled     Medication Management   All medications including indication, dose, frequency, and affordability concerns (if applicable) and disease states were reviewed and assessed during visit. Notes and recommendations from today's visit have been sent to the appropriate provider(s).    Supervising physician for CPP: [name], [name], MD        Bretta Bang PharmD, BCPS, BCACP, BC-ADM  Clinical Pharmacist- [place] [facility] ([facility])  Direct Line: [phone] -  [facility] Clinic: [phone] - Fax: [phone]    Future Appointments   Date Time Provider Department Center   07/27/2023 10:30 AM PHARMACIST [facility]   09/27/2023 11:00 AM Livingston Diones, MD [facility]   12/25/2023 10:00 AM Britt Bottom, MD [facility]       Current Medications:     Current Outpatient Medications on File Prior to Visit   Medication Sig Dispense Refill    aspirin (ECOTRIN) 81 MG tablet Take 1 tablet (81 mg total) by mouth.      blood sugar diagnostic (ACCU-CHEK GUIDE TEST STRIPS) Strp Use to check blood sugar once daily (fasting) 100 each 3    blood-glucose meter kit Check blood sugar daily. 1 each 0    blood-glucose meter Misc Use once daily to check blood sugar as directed by your physician 1 each 0    carvediloL (COREG) 12.5 MG tablet Take 1 tablet (12.5 mg total) by mouth 2 (two) times a day with meals. 180 tablet 1    dapagliflozin propanediol (FARXIGA) 10 mg Tab tablet Take 1 tablet (10 mg total) by mouth every morning. 30 tablet 6    DULoxetine (CYMBALTA) 60 MG capsule Take 1 capsule (60 mg total) by mouth daily. 90 capsule 3    evolocumab (REPATHA SURECLICK) 140 mg/mL PnIj Inject the contents of one pen (140 mg) under the skin every fourteen (14) days. (Patient not taking: Reported on 05/29/2023) 6 mL 0    ezetimibe (ZETIA) 10 mg tablet Take 1 tablet (10 mg total) by mouth daily. 90 tablet 3    folic acid (FOLVITE) 1 MG tablet Take 1 tablet (1 mg total) by mouth daily.      lancets Misc Use as directed 200 each 2    lancets Misc Use to check blood sugar once daily (fasting) 100 each 4    nitroglycerin (NITROSTAT) 0.4 MG SL tablet PLACE 1 TABLET UNDER THE TONGUE EVERY 5 MINUTES AS NEEDED FOR CHEST PAIN UP TO 3 DOSES THEN CALL 911 IF NO RELIEF      olmesartan (BENICAR) 40 MG tablet Take 1 tablet (40 mg total) by mouth daily. 90 tablet 4    pen needle, diabetic 31 gauge x 5/16 (8 mm) Ndle Use 1-3 times daily as directed. 100 each 3  safety needles 25 x 5/8  Ndle Use once a week for 4 weeks for b12 injections then once a month after 4 weeks 100 each 3    semaglutide (OZEMPIC) 1 mg/dose (4 mg/3 mL) PnIj injection Inject 1 mg under the skin every seven (7) days. 9 mL 1    syringe with needle (BD LUER-LOK SYRINGE) 3 mL 25 gauge x 1 Syrg Use once a week for 4 weeks for b12 injections then once a month after 4 weeks 100 each 2     No current facility-administered medications on file prior to visit.

## 2023-07-14 NOTE — Unmapped (Signed)
Form for Repatha completed by patient and provider. Faxed to respective patient assistance foundation

## 2023-07-25 NOTE — Unmapped (Signed)
Successfully faxed paperwork to [employer] at [phone] - they needed [place] to fill out Section K correctly with Dr. Tye [place]' information.    Placed doc in scan bin.

## 2023-08-03 NOTE — Unmapped (Signed)
[place] [facility] ([facility]) [facility]  Embedded Clinic Visit      Name: Madison Harris  Date of Birth: 1955-09-20  Today's Date: 08/03/2023   Age: 68 y.o.      Patient messaged last week via MyChart noting that she was not feeling well and wanted to reschedule appointment. Rescheduled appointment but was never able to receive confirmation and patient did not answer phone today when called. Will attempt follow up at later date     Patient Assistance Application Status:  Applications submitted for Ozempic, Repatha, and [name]. Ozempic and Repatha are awaiting proof of income verification to make determination for eligibility of program. Madison Harris application was denied because they state that income level is above threshold for program. According to patient only income is from social security for husband and herself. This would put income below threshold and proof of income would be required to appeal denial for Madison Harris program as well.         Bretta Bang PharmD, BCPS, BCACP, BC-ADM  Clinical Pharmacist-  [facility] ([facility])  Direct Line: [phone] -  C[facility] Clinic: [phone] - Fax: [phone]

## 2023-08-21 DIAGNOSIS — E1165 Type 2 diabetes mellitus with hyperglycemia: Principal | ICD-10-CM

## 2023-08-21 DIAGNOSIS — Z794 Long term (current) use of insulin: Principal | ICD-10-CM

## 2023-08-21 NOTE — Unmapped (Signed)
 Proof of income received for patient and spouse. Letters sent to [employer], appeal letter sent to [employer]

## 2023-08-21 NOTE — Unmapped (Signed)
 [place] [facility] ([facility]) [facility]  Embedded Clinic Visit      Name: Madison Harris  Date of Birth: Jan 12, 1956  Today's Date: 08/21/2023   Age: 68 y.o.      Patient Summary    Visit Type: Documentation Only, Time spent with patient: 0    Assessment & Plan  Type 2 diabetes mellitus with hyperglycemia, with long-term current use of insulin (CMS-HCC)  Proof of income received for patient and spouse. Letters sent to [employer], appeal letter sent to [employer]     Follow- up  1-2 weeks for program status     Medication Management   All medications including indication, dose, frequency, and affordability concerns (if applicable) and disease states were reviewed and assessed during visit. Notes and recommendations from today's visit have been sent to the appropriate provider(s).    Supervising physician for CPP: [name], [name], MD          Bretta Bang PharmD, BCPS, BCACP, BC-ADM  Clinical Pharmacist-  [facility] ([facility])  Direct Line: [phone] -  [facility] Clinic: [phone] - Fax: [phone]      Future Appointments   Date Time Provider Department Center   09/27/2023 11:00 AM Livingston Diones, MD [facility]   12/25/2023 10:00 AM Britt Bottom, MD [facility]       Current Medications:     Current Outpatient Medications on File Prior to Visit   Medication Sig Dispense Refill    aspirin (ECOTRIN) 81 MG tablet Take 1 tablet (81 mg total) by mouth.      blood sugar diagnostic (ACCU-CHEK GUIDE TEST STRIPS) Strp Use to check blood sugar once daily (fasting) 100 each 3    blood-glucose meter kit Check blood sugar daily. 1 each 0    blood-glucose meter Misc Use once daily to check blood sugar as directed by your physician 1 each 0    carvediloL (COREG) 12.5 MG tablet Take 1 tablet (12.5 mg total) by mouth 2 (two) times a day with meals. 180 tablet 1    dapagliflozin propanediol (FARXIGA) 10 mg Tab tablet Take 1 tablet (10 mg total) by mouth every morning. 30 tablet 6    DULoxetine (CYMBALTA) 60 MG capsule Take 1 capsule (60 mg total) by mouth daily. 90 capsule 3    evolocumab (REPATHA SURECLICK) 140 mg/mL PnIj Inject the contents of one pen (140 mg) under the skin every fourteen (14) days. (Patient not taking: Reported on 05/29/2023) 6 mL 0    ezetimibe (ZETIA) 10 mg tablet Take 1 tablet (10 mg total) by mouth daily. 90 tablet 3    folic acid (FOLVITE) 1 MG tablet Take 1 tablet (1 mg total) by mouth daily.      lancets Misc Use as directed 200 each 2    lancets Misc Use to check blood sugar once daily (fasting) 100 each 4    nitroglycerin (NITROSTAT) 0.4 MG SL tablet PLACE 1 TABLET UNDER THE TONGUE EVERY 5 MINUTES AS NEEDED FOR CHEST PAIN UP TO 3 DOSES THEN CALL 911 IF NO RELIEF      olmesartan (BENICAR) 40 MG tablet Take 1 tablet (40 mg total) by mouth daily. 90 tablet 4    pen needle, diabetic 31 gauge x 5/16 (8 mm) Ndle Use 1-3 times daily as directed. 100 each 3    safety needles 25 x 5/8  Ndle Use once a week for 4 weeks for b12  injections then once a month after 4 weeks 100 each 3    semaglutide (OZEMPIC) 1 mg/dose (4 mg/3 mL) PnIj injection Inject 1 mg under the skin every seven (7) days. 9 mL 1    syringe with needle (BD LUER-LOK SYRINGE) 3 mL 25 gauge x 1 Syrg Use once a week for 4 weeks for b12 injections then once a month after 4 weeks 100 each 2     No current facility-administered medications on file prior to visit.

## 2023-08-23 DIAGNOSIS — Z794 Long term (current) use of insulin: Principal | ICD-10-CM

## 2023-08-23 DIAGNOSIS — E1165 Type 2 diabetes mellitus with hyperglycemia: Principal | ICD-10-CM

## 2023-08-23 DIAGNOSIS — E782 Mixed hyperlipidemia: Principal | ICD-10-CM

## 2023-08-23 NOTE — Unmapped (Signed)
 [place] [facility] ([facility]  Embedded Clinic Visit      Name: Madison Harris  Date of Birth: 1956/04/17  Today's Date: 08/23/2023   Age: 68 y.o.      Patient Summary    Visit Type: Documentation Only, Time spent with patient: 0    Assessment & Plan  Mixed hyperlipidemia    Assessment / Plan  The ASCVD Risk score (Arnett DK, et al., 2019) failed to calculate.  Lab Results   Component Value Date    LDL 158 (H) 05/29/2023    TRIG 177 (H) 05/29/2023     Called [employer]. Proof of income received and accepted. Meets income guidelines.  Agent informed me that they would need a prior authorization from [facility] for patient as well. PA submitted through cover my meds and approved. Uploaded approval to media tab  Agent also informed me that a patient access foundation grant is also currently open and patient would need to apply for that first.   Will work with patient to apply for [employer] (Hypercholesteremia - Medicare)     _____________________________________________________   Type 2 diabetes mellitus with hyperglycemia, with long-term current use of insulin (CMS-HCC)    Assessment / Plan  Lab Results   Component Value Date    A1C 10.3 (H) 05/29/2023    A1C 9.1 (H) 11/24/2022    A1C 6.3 (H) 03/16/2022     Called [place] [employer] [place], stated that they needed a new letter. New letter with requests demanded by program sent   _____________________________________________________       Follow- up  1 week    Medication Management   All medications including indication, dose, frequency, and affordability concerns (if applicable) and disease states were reviewed and assessed during visit. Notes and recommendations from today's visit have been sent to the appropriate provider(s).    Supervising physician for CPP: Dr Jennefer Bravo PharmD, BCPS, BCACP, BC-ADM  Clinical Pharmacist- [place] [facility] ([facility])  Direct Line: [phone] -  [facility] Clinic: [phone] - Fax: [phone]      Future Appointments   Date Time Provider Department Center   09/27/2023 11:00 AM Livingston Diones, MD [facility]   12/25/2023 10:00 AM Britt Bottom, MD [facility]       Current Medications:     Current Outpatient Medications on File Prior to Visit   Medication Sig Dispense Refill    aspirin (ECOTRIN) 81 MG tablet Take 1 tablet (81 mg total) by mouth.      blood sugar diagnostic (ACCU-CHEK GUIDE TEST STRIPS) Strp Use to check blood sugar once daily (fasting) 100 each 3    blood-glucose meter kit Check blood sugar daily. 1 each 0    blood-glucose meter Misc Use once daily to check blood sugar as directed by your physician 1 each 0    carvediloL (COREG) 12.5 MG tablet Take 1 tablet (12.5 mg total) by mouth 2 (two) times a day with meals. 180 tablet 1    dapagliflozin propanediol (FARXIGA) 10 mg Tab tablet Take 1 tablet (10 mg total) by mouth every morning. 30 tablet 6    DULoxetine (CYMBALTA) 60 MG capsule Take 1 capsule (60 mg total) by mouth daily. 90 capsule 3    evolocumab (REPATHA SURECLICK) 140 mg/mL PnIj Inject the contents of one pen (140 mg) under the skin every fourteen (14) days. (Patient not taking:  Reported on 05/29/2023) 6 mL 0    ezetimibe (ZETIA) 10 mg tablet Take 1 tablet (10 mg total) by mouth daily. 90 tablet 3    folic acid (FOLVITE) 1 MG tablet Take 1 tablet (1 mg total) by mouth daily.      lancets Misc Use as directed 200 each 2    lancets Misc Use to check blood sugar once daily (fasting) 100 each 4    nitroglycerin (NITROSTAT) 0.4 MG SL tablet PLACE 1 TABLET UNDER THE TONGUE EVERY 5 MINUTES AS NEEDED FOR CHEST PAIN UP TO 3 DOSES THEN CALL 911 IF NO RELIEF      olmesartan (BENICAR) 40 MG tablet Take 1 tablet (40 mg total) by mouth daily. 90 tablet 4    pen needle, diabetic 31 gauge x 5/16 (8 mm) Ndle Use 1-3 times daily as directed. 100 each 3    safety needles 25 x 5/8  Ndle Use once a week for 4 weeks for b12 injections then once a month after 4 weeks 100 each 3    semaglutide (OZEMPIC) 1 mg/dose (4 mg/3 mL) PnIj injection Inject 1 mg under the skin every seven (7) days. 9 mL 1    syringe with needle (BD LUER-LOK SYRINGE) 3 mL 25 gauge x 1 Syrg Use once a week for 4 weeks for b12 injections then once a month after 4 weeks 100 each 2     No current facility-administered medications on file prior to visit.

## 2023-08-23 NOTE — Unmapped (Signed)
 Assessment / Plan  The ASCVD Risk score (Arnett DK, et al., 2019) failed to calculate.  Lab Results   Component Value Date    LDL 158 (H) 05/29/2023    TRIG 177 (H) 05/29/2023     Called [employer]. Proof of income received and accepted. Meets income guidelines.  Agent informed me that they would need a prior authorization from [facility] for patient as well. PA submitted through cover my meds and approved. Uploaded approval to media tab  Agent also informed me that a patient access foundation grant is also currently open and patient would need to apply for that first.   Will work with patient to apply for [employer] (Hypercholesteremia - Medicare)     _____________________________________________________

## 2023-08-23 NOTE — Unmapped (Signed)
 Assessment / Plan  Lab Results   Component Value Date    A1C 10.3 (H) 05/29/2023    A1C 9.1 (H) 11/24/2022    A1C 6.3 (H) 03/16/2022     Called [place] [employer] [place], stated that they needed a new letter. New letter with requests demanded by program sent   _____________________________________________________

## 2023-08-29 DIAGNOSIS — I2510 Atherosclerotic heart disease of native coronary artery without angina pectoris: Principal | ICD-10-CM

## 2023-08-29 DIAGNOSIS — Z794 Long term (current) use of insulin: Principal | ICD-10-CM

## 2023-08-29 DIAGNOSIS — E1165 Type 2 diabetes mellitus with hyperglycemia: Principal | ICD-10-CM

## 2023-08-29 NOTE — Unmapped (Signed)
 Assessment / Plan  Lab Results   Component Value Date    A1C 10.3 (H) 05/29/2023    A1C 9.1 (H) 11/24/2022    A1C 6.3 (H) 03/16/2022     Farxiga through [place] [employer] [place] was approved, medication will be shipped to patient  [employer] claims they are unable to read proof of income documents. Have sent them multiple copies all of which they claim are illegible. Will work with patient to obtain a hard copy of her statements to provide to [employer] for approval    _____________________________________________________

## 2023-08-29 NOTE — Unmapped (Signed)
 Assessment / Plan  [employer] requires patient to apply to [facility] as they have an open disease fund for hypercholesteremia patients on Medicare. If denied from the [facility] or closes before patient accepted, will go back through [employer] and be approved. Will work with patient to apply to [employer]. Medication would come from local pharmacy and patient is given a card to use towards copays of medications.    _____________________________________________________

## 2023-08-29 NOTE — Unmapped (Signed)
 [place] [facility] ([facility]  Embedded Clinic Visit      Name: Madison Harris  Date of Birth: Jan 23, 1956  Today's Date: 08/29/2023   Age: 68 y.o.      Patient Summary    Visit Type: Documentation Only, Time spent with patient: 4    Assessment & Plan  Type 2 diabetes mellitus with hyperglycemia, with long-term current use of insulin (CMS-HCC)    Assessment / Plan  Lab Results   Component Value Date    A1C 10.3 (H) 05/29/2023    A1C 9.1 (H) 11/24/2022    A1C 6.3 (H) 03/16/2022     Farxiga through [place] [employer] [place] was [place], medication will be shipped to patient  [employer] claims they are unable to read proof of income documents. Have sent them multiple copies all of which they claim are illegible. Will work with patient to obtain a hard copy of her statements to provide to [employer] for approval    _____________________________________________________   CAD s/p PCI    Assessment / Plan  [employer] requires patient to apply to [employer] as they have an open disease fund for hypercholesteremia patients on Medicare. If denied from the [facility] or closes before patient accepted, will go back through [employer] and be approved. Will work with patient to apply to [employer]. Medication would come from local pharmacy and patient is given a card to use towards copays of medications.    _____________________________________________________     Follow- up  As above     Medication Management   All medications including indication, dose, frequency, and affordability concerns (if applicable) and disease states were reviewed and assessed during visit. Notes and recommendations from today's visit have been sent to the appropriate provider(s).    Supervising physician for CPP: Dr Jennefer Bravo PharmD, BCPS, BCACP, BC-ADM  Clinical Pharmacist- [place] [facility] ([facility])  Direct Line: [phone] -  [facility] Clinic: [phone] - Fax: [phone]      Future Appointments   Date Time Provider Department Center   09/27/2023 11:00 AM Livingston Diones, MD [facility]   12/25/2023 10:00 AM Britt Bottom, MD [facility]       Current Medications:     Current Outpatient Medications on File Prior to Visit   Medication Sig Dispense Refill    aspirin (ECOTRIN) 81 MG tablet Take 1 tablet (81 mg total) by mouth.      blood sugar diagnostic (ACCU-CHEK GUIDE TEST STRIPS) Strp Use to check blood sugar once daily (fasting) 100 each 3    blood-glucose meter kit Check blood sugar daily. 1 each 0    blood-glucose meter Misc Use once daily to check blood sugar as directed by your physician 1 each 0    carvediloL (COREG) 12.5 MG tablet Take 1 tablet (12.5 mg total) by mouth 2 (two) times a day with meals. 180 tablet 1    dapagliflozin propanediol (FARXIGA) 10 mg Tab tablet Take 1 tablet (10 mg total) by mouth every morning. 30 tablet 6    DULoxetine (CYMBALTA) 60 MG capsule Take 1 capsule (60 mg total) by mouth daily. 90 capsule 3    evolocumab (REPATHA SURECLICK) 140 mg/mL PnIj Inject the contents of one pen (140 mg) under the skin every fourteen (14) days. (Patient not taking: Reported on 05/29/2023) 6 mL 0    ezetimibe (ZETIA) 10 mg tablet  Take 1 tablet (10 mg total) by mouth daily. 90 tablet 3    folic acid (FOLVITE) 1 MG tablet Take 1 tablet (1 mg total) by mouth daily.      lancets Misc Use as directed 200 each 2    lancets Misc Use to check blood sugar once daily (fasting) 100 each 4    nitroglycerin (NITROSTAT) 0.4 MG SL tablet PLACE 1 TABLET UNDER THE TONGUE EVERY 5 MINUTES AS NEEDED FOR CHEST PAIN UP TO 3 DOSES THEN CALL 911 IF NO RELIEF      olmesartan (BENICAR) 40 MG tablet Take 1 tablet (40 mg total) by mouth daily. 90 tablet 4    pen needle, diabetic 31 gauge x 5/16 (8 mm) Ndle Use 1-3 times daily as directed. 100 each 3    safety needles 25 x 5/8  Ndle Use once a week for 4 weeks for b12 injections then once a month after 4 weeks 100 each 3    semaglutide (OZEMPIC) 1 mg/dose (4 mg/3 mL) PnIj injection Inject 1 mg under the skin every seven (7) days. 9 mL 1    syringe with needle (BD LUER-LOK SYRINGE) 3 mL 25 gauge x 1 Syrg Use once a week for 4 weeks for b12 injections then once a month after 4 weeks 100 each 2     No current facility-administered medications on file prior to visit.

## 2023-09-14 DIAGNOSIS — E119 Type 2 diabetes mellitus without complications: Principal | ICD-10-CM

## 2023-09-14 DIAGNOSIS — E1165 Type 2 diabetes mellitus with hyperglycemia: Principal | ICD-10-CM

## 2023-09-14 DIAGNOSIS — I2510 Atherosclerotic heart disease of native coronary artery without angina pectoris: Principal | ICD-10-CM

## 2023-09-14 DIAGNOSIS — Z794 Long term (current) use of insulin: Principal | ICD-10-CM

## 2023-09-14 NOTE — Unmapped (Signed)
 Assessment / Plan  Lab Results   Component Value Date    A1C 10.3 (H) 05/29/2023    A1C 9.1 (H) 11/24/2022    A1C 6.3 (H) 03/16/2022     Patient noted that she had a clearer version of her proof of income that she will send when she gets back home. [employer] claims they are unable to read sent copies of forms in media tab and request fresh copies. Will send appeal letter to them when new copies of forms are received   _____________________________________________________

## 2023-09-14 NOTE — Unmapped (Signed)
 Assessment / Plan  Called patient to remind her she needs to call [employer] to tell them she didn't file taxes in 2023. Informed me that she will be filing taxes for 2024 but hasn't filed them yet. Patient would still need to call [employer] to let them know that she hasn't filed taxes yet and I can submit the proof of income verification they would need to approve her assistance for Repatha    _____________________________________________________

## 2023-09-14 NOTE — Unmapped (Signed)
 [place] [facility] ([facility]  Embedded Clinic Visit      Name: Madison Harris  Date of Birth: 1956/04/25  Today's Date: 09/14/2023   Age: 68 y.o.      Patient Summary    Visit Type: Phone, The patient reports they are physically located in [place] and is currently: at home. I conducted a phone visit.  I spent 10 minutes on the phone call with the patient on the date of service .     Assessment & Plan  CAD s/p PCI    Assessment / Plan  Called patient to remind her she needs to call [employer] to tell them she didn't file taxes in 2023. Informed me that she will be filing taxes for 2024 but hasn't filed them yet. Patient would still need to call [employer] to let them know that she hasn't filed taxes yet and I can submit the proof of income verification they would need to approve her assistance for Repatha    _____________________________________________________    Type 2 diabetes mellitus with hyperglycemia, with long-term current use of insulin    Assessment / Plan  Lab Results   Component Value Date    A1C 10.3 (H) 05/29/2023    A1C 9.1 (H) 11/24/2022    A1C 6.3 (H) 03/16/2022     Patient noted that she had a clearer version of her proof of income that she will send when she gets back home. [employer] claims they are unable to read sent copies of forms in media tab and request fresh copies. Will send appeal letter to them when new copies of forms are received   _____________________________________________________       Follow- up  Once forms submitted     Medication Management   All medications including indication, dose, frequency, and affordability concerns (if applicable) and disease states were reviewed and assessed during visit. Notes and recommendations from today's visit have been sent to the appropriate provider(s).    Supervising physician for CPP: [name], [name], MD          Bretta Bang PharmD, BCPS, BCACP, BC-ADM  Clinical Pharmacist- [place] [facility] ([facility])  Direct Line: [phone] -  [facility] Clinic: [phone] - Fax: [phone]      Future Appointments   Date Time Provider Department Center   09/27/2023 11:00 AM Livingston Diones, MD [facility]   12/25/2023 10:00 AM Britt Bottom, MD [facility]       Current Medications:     Current Outpatient Medications on File Prior to Visit   Medication Sig Dispense Refill    aspirin (ECOTRIN) 81 MG tablet Take 1 tablet (81 mg total) by mouth.      blood sugar diagnostic (ACCU-CHEK GUIDE TEST STRIPS) Strp Use to check blood sugar once daily (fasting) 100 each 3    blood-glucose meter kit Check blood sugar daily. 1 each 0    blood-glucose meter Misc Use once daily to check blood sugar as directed by your physician 1 each 0    carvediloL (COREG) 12.5 MG tablet Take 1 tablet (12.5 mg total) by mouth 2 (two) times a day with meals. 180 tablet 1    dapagliflozin propanediol (FARXIGA) 10 mg Tab tablet Take 1 tablet (10 mg total) by mouth every morning. 30 tablet 6    DULoxetine (CYMBALTA) 60 MG capsule Take 1 capsule (60 mg total) by mouth daily. 90 capsule 3    evolocumab (  REPATHA SURECLICK) 140 mg/mL PnIj Inject the contents of one pen (140 mg) under the skin every fourteen (14) days. (Patient not taking: Reported on 05/29/2023) 6 mL 0    ezetimibe (ZETIA) 10 mg tablet Take 1 tablet (10 mg total) by mouth daily. 90 tablet 3    folic acid (FOLVITE) 1 MG tablet Take 1 tablet (1 mg total) by mouth daily.      lancets Misc Use as directed 200 each 2    lancets Misc Use to check blood sugar once daily (fasting) 100 each 4    nitroglycerin (NITROSTAT) 0.4 MG SL tablet PLACE 1 TABLET UNDER THE TONGUE EVERY 5 MINUTES AS NEEDED FOR CHEST PAIN UP TO 3 DOSES THEN CALL 911 IF NO RELIEF      olmesartan (BENICAR) 40 MG tablet Take 1 tablet (40 mg total) by mouth daily. 90 tablet 4    pen needle, diabetic 31 gauge x 5/16 (8 mm) Ndle Use 1-3 times daily as directed. 100 each 3    safety needles 25 x 5/8  Ndle Use once a week for 4 weeks for b12 injections then once a month after 4 weeks 100 each 3    semaglutide (OZEMPIC) 1 mg/dose (4 mg/3 mL) PnIj injection Inject 1 mg under the skin every seven (7) days. 9 mL 1    syringe with needle (BD LUER-LOK SYRINGE) 3 mL 25 gauge x 1 Syrg Use once a week for 4 weeks for b12 injections then once a month after 4 weeks 100 each 2     No current facility-administered medications on file prior to visit.

## 2023-09-26 DIAGNOSIS — Z794 Long term (current) use of insulin: Principal | ICD-10-CM

## 2023-09-26 DIAGNOSIS — E782 Mixed hyperlipidemia: Principal | ICD-10-CM

## 2023-09-26 DIAGNOSIS — E1165 Type 2 diabetes mellitus with hyperglycemia: Principal | ICD-10-CM

## 2023-09-26 NOTE — Unmapped (Signed)
 Assessment / Plan  Lab Results   Component Value Date    A1C 10.3 (H) 05/29/2023    A1C 9.1 (H) 11/24/2022    A1C 6.3 (H) 03/16/2022     [employer] also requests clearer versions of patient's income statements. Will send to program once received from patient   Farxiga shipped to patient and she noted that she had started   _____________________________________________________

## 2023-09-26 NOTE — Unmapped (Signed)
 Assessment / Plan  The ASCVD Risk score (Arnett DK, et al., 2019) failed to calculate.  Lab Results   Component Value Date    LDL 158 (H) 05/29/2023    TRIG 177 (H) 05/29/2023     Called [employer]. Received income documents but hard to read. Requesting clearer documents. Patient notified via email. Once processed, can receive Repatha from local pharmacy using copay card   _____________________________________________________

## 2023-09-26 NOTE — Unmapped (Signed)
 [place] [facility]  Embedded Clinic Visit      Name: Madison Harris  Date of Birth: September 28, 1955  Today's Date: 09/26/2023   Age: 68 y.o.      Patient Summary    Visit Type: Documentation Only, Time spent with patient: 1    Assessment & Plan  Mixed hyperlipidemia    Assessment / Plan  The ASCVD Risk score (Arnett DK, et al., 2019) failed to calculate.  Lab Results   Component Value Date    LDL 158 (H) 05/29/2023    TRIG 177 (H) 05/29/2023     Called [employer]. Received income documents but hard to read. Requesting clearer documents. Patient notified via email. Once processed, can receive Repatha from local pharmacy using copay card   _____________________________________________________   Type 2 diabetes mellitus with hyperglycemia, with long-term current use of insulin    Assessment / Plan  Lab Results   Component Value Date    A1C 10.3 (H) 05/29/2023    A1C 9.1 (H) 11/24/2022    A1C 6.3 (H) 03/16/2022     [employer] also requests clearer versions of patient's income statements. Will send to program once received from patient   Farxiga shipped to patient and she noted that she had started   _____________________________________________________     Follow- up  Once documents received for income     Medication Management   All medications including indication, dose, frequency, and affordability concerns (if applicable) and disease states were reviewed and assessed during visit. Notes and recommendations from today's visit have been sent to the appropriate provider(s).    Supervising physician for CPP: [name], Isidoro Margarita, MD          Othel Blind PharmD, BCPS, BCACP, BC-ADM  Clinical Pharmacist- [place] [facility] ([facility])  Direct Line: [phone] -  [facility] Clinic: [phone] - Fax: [phone]      Future Appointments   Date Time Provider Department Center   09/27/2023 11:00 AM Synthia Ewing, MD [facility]   12/25/2023 10:00 AM Alta Ast, MD [facility]       Current Medications:     Current Outpatient Medications on File Prior to Visit   Medication Sig Dispense Refill    aspirin (ECOTRIN) 81 MG tablet Take 1 tablet (81 mg total) by mouth.      blood sugar diagnostic (ACCU-CHEK GUIDE TEST STRIPS) Strp Use to check blood sugar once daily (fasting) 100 each 3    blood-glucose meter kit Check blood sugar daily. 1 each 0    blood-glucose meter Misc Use once daily to check blood sugar as directed by your physician 1 each 0    carvediloL (COREG) 12.5 MG tablet Take 1 tablet (12.5 mg total) by mouth 2 (two) times a day with meals. 180 tablet 1    dapagliflozin propanediol (FARXIGA) 10 mg Tab tablet Take 1 tablet (10 mg total) by mouth every morning. (Patient taking differently: Take 1 tablet (10 mg total) by mouth every morning. Receives from Patient Assistance Program)      DULoxetine (CYMBALTA) 60 MG capsule Take 1 capsule (60 mg total) by mouth daily. 90 capsule 3    evolocumab (REPATHA SURECLICK) 140 mg/mL PnIj Inject the contents of one pen (140 mg) under the skin every fourteen (14) days. (Patient not taking: Reported on 05/29/2023) 6 mL 0    ezetimibe (ZETIA) 10 mg tablet Take 1 tablet (10 mg total) by  mouth daily. 90 tablet 3    folic acid (FOLVITE) 1 MG tablet Take 1 tablet (1 mg total) by mouth daily.      lancets Misc Use as directed 200 each 2    lancets Misc Use to check blood sugar once daily (fasting) 100 each 4    nitroglycerin (NITROSTAT) 0.4 MG SL tablet PLACE 1 TABLET UNDER THE TONGUE EVERY 5 MINUTES AS NEEDED FOR CHEST PAIN UP TO 3 DOSES THEN CALL 911 IF NO RELIEF      olmesartan (BENICAR) 40 MG tablet Take 1 tablet (40 mg total) by mouth daily. 90 tablet 4    pen needle, diabetic 31 gauge x 5/16 (8 mm) Ndle Use 1-3 times daily as directed. 100 each 3    safety needles 25 x 5/8  Ndle Use once a week for 4 weeks for b12 injections then once a month after 4 weeks 100 each 3 semaglutide (OZEMPIC) 1 mg/dose (4 mg/3 mL) PnIj injection Inject 1 mg under the skin every seven (7) days. 9 mL 1    syringe with needle (BD LUER-LOK SYRINGE) 3 mL 25 gauge x 1 Syrg Use once a week for 4 weeks for b12 injections then once a month after 4 weeks 100 each 2     No current facility-administered medications on file prior to visit.

## 2023-09-27 ENCOUNTER — Ambulatory Visit: Admit: 2023-09-27 | Discharge: 2023-09-28 | Attending: Internal Medicine | Primary: Internal Medicine

## 2023-09-27 DIAGNOSIS — Z1231 Encounter for screening mammogram for malignant neoplasm of breast: Principal | ICD-10-CM

## 2023-09-27 DIAGNOSIS — I10 Essential (primary) hypertension: Principal | ICD-10-CM

## 2023-09-27 DIAGNOSIS — E782 Mixed hyperlipidemia: Principal | ICD-10-CM

## 2023-09-27 DIAGNOSIS — M353 Polymyalgia rheumatica: Principal | ICD-10-CM

## 2023-09-27 DIAGNOSIS — Z794 Long term (current) use of insulin: Principal | ICD-10-CM

## 2023-09-27 DIAGNOSIS — E1165 Type 2 diabetes mellitus with hyperglycemia: Principal | ICD-10-CM

## 2023-09-27 DIAGNOSIS — I2510 Atherosclerotic heart disease of native coronary artery without angina pectoris: Principal | ICD-10-CM

## 2023-09-27 DIAGNOSIS — E538 Deficiency of other specified B group vitamins: Principal | ICD-10-CM

## 2023-09-27 DIAGNOSIS — F411 Generalized anxiety disorder: Principal | ICD-10-CM

## 2023-09-27 LAB — LIPID PANEL
CHOLESTEROL/HDL RATIO SCREEN: 4.3 (ref 0.0–4.5)
CHOLESTEROL: 255 mg/dL — ABNORMAL HIGH (ref 0–200)
HDL CHOLESTEROL: 60 mg/dL (ref 40–60)
LDL CHOLESTEROL CALCULATED: 133 mg/dL — ABNORMAL HIGH (ref 30–130)
NON-HDL CHOLESTEROL: 195 mg/dL
TRIGLYCERIDES: 312 mg/dL — ABNORMAL HIGH (ref <=150)
VLDL CHOLESTEROL CAL: 62.4 mg/dL — ABNORMAL HIGH (ref 25–40)

## 2023-09-27 LAB — COMPREHENSIVE METABOLIC PANEL
ALBUMIN/GLOBULIN RATIO: 1.5 (ref 1.1–2.2)
ALBUMIN: 4.1 g/dL (ref 3.5–5.0)
ALKALINE PHOSPHATASE: 97 U/L (ref 50–136)
ALT (SGPT): 27 U/L (ref 12–78)
ANION GAP: 10 mmol/L
AST (SGOT): 20 U/L (ref 7–31)
BILIRUBIN TOTAL: 0.6 mg/dL (ref 0.0–1.5)
BLOOD UREA NITROGEN: 19 mg/dL (ref 5–26)
BUN / CREAT RATIO: 21 (ref 12–25)
CALCIUM: 9.1 mg/dL (ref 8.5–10.5)
CHLORIDE: 100 mmol/L (ref 95–110)
CO2: 25.4 mmol/L (ref 21.0–31.0)
CREATININE: 0.91 mg/dL (ref 0.50–1.50)
EGFR CKD-EPI (2021) FEMALE: 70 mL/min/1.73m2 (ref >=60)
GLOBULIN, TOTAL: 2.8 g/dL (ref 1.5–4.3)
GLUCOSE RANDOM: 421 mg/dL — ABNORMAL HIGH (ref 60–100)
POTASSIUM: 4.4 mmol/L (ref 3.5–5.1)
PROTEIN TOTAL: 6.9 g/dL (ref 6.4–8.3)
SODIUM: 135 mmol/L — ABNORMAL LOW (ref 136–145)

## 2023-09-27 LAB — VITAMIN B12: VITAMIN B-12: 292 pg/mL (ref 211–911)

## 2023-09-27 LAB — HEMOGLOBIN A1C
ESTIMATED AVERAGE GLUCOSE: 255 mg/dL
HEMOGLOBIN A1C: 10.5 % — ABNORMAL HIGH (ref 4.8–5.6)

## 2023-09-27 MED ORDER — EZETIMIBE 10 MG TABLET
ORAL_TABLET | Freq: Every day | ORAL | 3 refills | 90.00 days | Status: CP
Start: 2023-09-27 — End: 2024-09-26

## 2023-09-27 MED ORDER — SAFETY NEEDLES 25 GAUGE X 5/8"
3 refills | 0.00 days | Status: CP
Start: 2023-09-27 — End: ?

## 2023-09-27 MED ORDER — OLMESARTAN 40 MG TABLET
ORAL_TABLET | Freq: Every day | ORAL | 4 refills | 90.00 days | Status: CP
Start: 2023-09-27 — End: 2024-09-26

## 2023-09-27 MED ORDER — DULOXETINE 60 MG CAPSULE,DELAYED RELEASE
ORAL_CAPSULE | Freq: Every day | ORAL | 3 refills | 90.00 days | Status: CP
Start: 2023-09-27 — End: 2024-09-26

## 2023-09-27 MED ORDER — BD LUER-LOK SYRINGE 3 ML 25 GAUGE X 1"
2 refills | 0.00 days | Status: CP
Start: 2023-09-27 — End: ?

## 2023-09-27 MED ORDER — CARVEDILOL 12.5 MG TABLET
ORAL_TABLET | Freq: Two times a day (BID) | ORAL | 1 refills | 90.00 days | Status: CP
Start: 2023-09-27 — End: 2024-03-25

## 2023-09-27 NOTE — Unmapped (Signed)
 LDL is at 158 mg/dL. She experiences muscle pains with pravastatin and is currently on ezetimibe. Continue ezetimibe and pursue Repatha through patient assistance programs for cardiovascular health.

## 2023-09-27 NOTE — Unmapped (Signed)
 Well controlled with Olmesartan - continue.

## 2023-09-27 NOTE — Unmapped (Signed)
 Her symptoms have improved. Nausea from methotrexate injections is reduced by consuming a protein drink. Continue methotrexate injections and advise her to drink a protein drink before injections to minimize nausea.

## 2023-09-27 NOTE — Unmapped (Signed)
 Symptomatically doing well.  Blood pressure well-controlled.  Continue aspirin 81 mg daily.  Lipids currently uncontrolled on ezetimibe, working on Repatha patient assistance through [facility].

## 2023-09-27 NOTE — Unmapped (Addendum)
 Her HbA1c has increased to 10.3%, necessitating improved glycemic control and reduced cardiovascular risk. Continue working on PAP for [employer].  Continue Farxiga 10 mg daily. Blood work today. Continue working on diet and exercise.

## 2023-09-27 NOTE — Unmapped (Signed)
Check B12 level today.  

## 2023-09-27 NOTE — Unmapped (Signed)
 [facility] INTERNAL MEDICINE  [name]  [place] [address]  Ph: [phone]  Assessment & Plan  CAD s/p PCI  Symptomatically doing well.  Blood pressure well-controlled.  Continue aspirin 81 mg daily.  Lipids currently uncontrolled on ezetimibe, working on Repatha patient assistance through [facility].  Essential hypertension  Well controlled with Olmesartan - continue.  Type 2 diabetes mellitus with hyperglycemia, with long-term current use of insulin  Her HbA1c has increased to 10.3%, necessitating improved glycemic control and reduced cardiovascular risk. Continue working on PAP for [employer].  Continue Farxiga 10 mg daily. Blood work today. Continue working on diet and exercise.  Encounter for screening mammogram for malignant neoplasm of breast  Again encouraged mammogram which she has not done despite me ordering twice in the past year.  B12 deficiency  Check B12 level today.  GAD (generalized anxiety disorder)  Okay to increase Cymbalta to 120 mg daily in the morning.  Recommend follow-up with Alban Hudson to ensure this is working and no adverse effects.  Mixed hyperlipidemia  LDL is at 158 mg/dL. She experiences muscle pains with pravastatin and is currently on ezetimibe. Continue ezetimibe and pursue Repatha through patient assistance programs for cardiovascular health.  PMR (polymyalgia rheumatica)  Her symptoms have improved. Nausea from methotrexate injections is reduced by consuming a protein drink. Continue methotrexate injections and advise her to drink a protein drink before injections to minimize nausea.    Orders Placed This Encounter   Procedures    Mammo Screening Bilateral    Comprehensive metabolic panel    Lipid Panel    Hemoglobin A1c    Vitamin B12       HPI     History of Present Illness  Madison Harris is a 68 year old female with hypertension, coronary artery disease, type two diabetes, dyslipidemia, and obstructive sleep apnea who presents for medication management and follow-up.    She experiences muscle pains associated with pravastatin use, and there is an ongoing effort to obtain patient assistance for Repatha, which has not yet been approved. She experiences diarrhea with metformin and is currently on Ozempic 1 mg every 7 days and Farxiga 10 mg daily. Her last hemoglobin A1c on May 29, 2023, was 10.3%, up from 6.3% in September 2023 when she was adherent to her medication regimen. Additionally, her LDL cholesterol was 158 mg/dL in December 8295.    She is currently taking olmesartan for blood pressure management, methotrexate for her rheumatologic condition, and duloxetine (Cymbalta) for which she is considering an increased dose. She also takes ezetimibe for cholesterol management. Her rheumatologic symptoms have been better recently, and she has been using Boost nutritional drink to mitigate nausea associated with methotrexate injections.    No chest pain, shortness of breath, swelling, or changes in bowel movements from her baseline. She is trying to improve her diet and increase physical activity, such as walking, to manage her diabetes better.      I have reviewed and updated the past medical, social, and family history today and updated them in the EMR.    EXAM & DATA   T   - P 68 - RR   - SpO2 97%  BP   BP Readings from Last 3 Encounters:   09/27/23 128/72   05/29/23 102/62   11/24/22 118/79     Wt   Wt Readings from Last 3 Encounters:   09/27/23 67.7 kg (149 lb 4.8 oz)   11/24/22 68.3  kg (150 lb 9.6 oz)   04/06/22 68 kg (150 lb)     Data Reviewed:  Lab Results   Component Value Date    NA 134 (L) 05/29/2023    K 4.0 05/29/2023    BUN 21 05/29/2023    CREATININE 1.03 05/29/2023    A1C 10.3 (H) 05/29/2023    CHOL 253 (H) 05/29/2023    LDL 158 (H) 05/29/2023    HDL 60 05/29/2023    TRIG 161 (H) 05/29/2023    ALT 30 05/29/2023       Lab Results   Component Value Date    A1C 10.3 (H) 05/29/2023    A1C 9.1 (H) 11/24/2022    A1C 6.3 (H) 03/16/2022    A1C 6.5 (H) 04/26/2021    A1C 6.7 (H) 12/02/2020    A1C 6.7 (H) 11/02/2020    A1C 8.9 (H) 08/10/2020    A1C 8.0 (H) 04/06/2020    A1C 7.1 (H) 07/26/2019    A1C 7.8 (H) 04/25/2019     No results found for: INSULIN  Lab Results   Component Value Date    TRIG 177 (H) 05/29/2023    TRIG 134 11/24/2022    TRIG 119 03/16/2022    TRIG 89 04/26/2021    TRIG 130 11/02/2020    TRIG 87 08/10/2020    TRIG 144 04/06/2020    TRIG 120 07/26/2019    TRIG 131 07/25/2018     Lab Results   Component Value Date    LDL 158 (H) 05/29/2023    LDL 151 (H) 11/24/2022    LDL 51 03/16/2022    LDL 53 04/26/2021    LDL 37 (L) 11/02/2020    LDL 46 08/10/2020    LDL 27 (L) 04/06/2020    LDL 21 07/26/2019    LDL 38.8 07/25/2018     Lab Results   Component Value Date    ALT 30 05/29/2023    ALT 33 11/24/2022    ALT 35 03/16/2022    ALT 25 10/21/2021    ALT 19 07/15/2021    ALT 19 04/26/2021    ALT 21 12/02/2020    ALT 19 11/02/2020    ALT 23 08/10/2020    ALT 20 04/06/2020     No results found for: APOB  No results found for: HSCRP  Lab Results   Component Value Date    ALBCRERAT 35.9 (H) 11/24/2022    ALBCRERAT 85.5 (H) 03/16/2022    ALBCRERAT 22.5 04/26/2021    ALBCRERAT 7.7 11/02/2020    ALBCRERAT  08/10/2020      Comment:      Unable to calculate due to value below lower limit of assay linearity.    ALBCRERAT 14.3 04/06/2020    ALBCRERAT 20.9 07/26/2019    ALBCRERAT 15.6 07/25/2018     No results found for: FERRITIN, VITD    ----------------------------------------------------------------------------------------------------------------------  This note was dictated with Dragon Medical and may contain typos missed during proofreading.

## 2023-10-10 DIAGNOSIS — E782 Mixed hyperlipidemia: Principal | ICD-10-CM

## 2023-10-10 DIAGNOSIS — E1165 Type 2 diabetes mellitus with hyperglycemia: Principal | ICD-10-CM

## 2023-10-10 DIAGNOSIS — Z794 Long term (current) use of insulin: Principal | ICD-10-CM

## 2023-10-10 NOTE — Unmapped (Signed)
 [place] [facility]  Embedded Clinic Visit      Name: Madison Harris  Date of Birth: 1955/08/03  Today's Date: 10/10/2023   Age: 68 y.o.      Patient Summary      Assessment & Plan  Mixed hyperlipidemia    Assessment / Plan  The ASCVD Risk score (Arnett DK, et al., 2019) failed to calculate.  Lab Results   Component Value Date    LDL 133 (H) 09/27/2023    TRIG 312 (H) 09/27/2023     Called patient to remind her we are still needing income verification for [employer]. If she filed taxes for 2024, will need first several pages of tax return (including household size). They also need income verification page found on their website. Will email this to her.   _____________________________________________________   Type 2 diabetes mellitus with hyperglycemia, with long-term current use of insulin     Assessment / Plan  Lab Results   Component Value Date    A1C 10.5 (H) 09/27/2023    A1C 10.3 (H) 05/29/2023    A1C 9.1 (H) 11/24/2022     See above, will await income information to send to [employer]  _____________________________________________________     Follow- up  Next week weeks if information received.     Medication Management   All medications including indication, dose, frequency, and affordability concerns (if applicable) and disease states were reviewed and assessed during visit. Notes and recommendations from today's visit have been sent to the appropriate provider(s).    Supervising physician for CPP: [name], Isidoro Margarita, MD          Othel Blind PharmD, BCPS, BCACP, BC-ADM  Clinical Pharmacist- [place] [facility] ([facility])  Direct Line: [phone] -  [facility] Clinic: [phone] - Fax: [phone]      Future Appointments   Date Time Provider Department Center   12/25/2023 10:00 AM Alta Ast, MD [facility]   12/28/2023 11:00 AM Darryle Ends, PA [facility]       Current Medications: Current Outpatient Medications on File Prior to Visit   Medication Sig Dispense Refill    aspirin (ECOTRIN) 81 MG tablet Take 1 tablet (81 mg total) by mouth.      blood sugar diagnostic (ACCU-CHEK GUIDE TEST STRIPS) Strp Use to check blood sugar once daily (fasting) (Patient not taking: Reported on 09/27/2023) 100 each 3    blood-glucose meter kit Check blood sugar daily. 1 each 0    blood-glucose meter Misc Use once daily to check blood sugar as directed by your physician (Patient not taking: Reported on 09/27/2023) 1 each 0    carvedilol  (COREG ) 12.5 MG tablet Take 1 tablet (12.5 mg total) by mouth 2 (two) times a day with meals. 180 tablet 1    dapagliflozin  propanediol (FARXIGA ) 10 mg Tab tablet Take 1 tablet (10 mg total) by mouth every morning. (Patient taking differently: Take 1 tablet (10 mg total) by mouth every morning. Receives from Patient Assistance Program)      DULoxetine  (CYMBALTA ) 60 MG capsule Take 2 capsules (120 mg total) by mouth daily. 180 capsule 3    evolocumab  (REPATHA  SURECLICK) 140 mg/mL PnIj Inject the contents of one pen (140 mg) under the skin every fourteen (14) days. 6 mL 0    ezetimibe  (ZETIA ) 10 mg tablet Take 1 tablet (10 mg total) by mouth daily. 90 tablet 3    folic acid  (FOLVITE )  1 MG tablet Take 1 tablet (1 mg total) by mouth daily.      lancets Misc Use as directed (Patient not taking: Reported on 09/27/2023) 200 each 2    lancets Misc Use to check blood sugar once daily (fasting) (Patient not taking: Reported on 09/27/2023) 100 each 4    methotrexate  sodium (METHOTREXATE , CONTAINS PRESERVATIVES,) 25 mg/mL injection solution Inject under the skin once.      nitroglycerin (NITROSTAT) 0.4 MG SL tablet PLACE 1 TABLET UNDER THE TONGUE EVERY 5 MINUTES AS NEEDED FOR CHEST PAIN UP TO 3 DOSES THEN CALL 911 IF NO RELIEF      olmesartan  (BENICAR ) 40 MG tablet Take 1 tablet (40 mg total) by mouth daily. 90 tablet 4    pen needle, diabetic 31 gauge x 5/16 (8 mm) Ndle Use 1-3 times daily as directed. 100 each 3    safety needles 25 gauge x 5/8 Ndle Use once a week for 4 weeks for b12 injections then once a month after 4 weeks 100 each 3    semaglutide  (OZEMPIC ) 1 mg/dose (4 mg/3 mL) PnIj injection Inject 1 mg under the skin every seven (7) days. (Patient not taking: Reported on 09/27/2023) 9 mL 1    syringe with needle (BD LUER-LOK SYRINGE) 3 mL 25 gauge x 1 Syrg Use once a week for 4 weeks for b12 injections then once a month after 4 weeks 100 each 2     No current facility-administered medications on file prior to visit.

## 2023-10-10 NOTE — Unmapped (Signed)
 Assessment / Plan  The ASCVD Risk score (Arnett DK, et al., 2019) failed to calculate.  Lab Results   Component Value Date    LDL 133 (H) 09/27/2023    TRIG 312 (H) 09/27/2023     Called patient to remind her we are still needing income verification for [employer]. If she filed taxes for 2024, will need first several pages of tax return (including household size). They also need income verification page found on their website. Will email this to her.   _____________________________________________________

## 2023-10-10 NOTE — Unmapped (Signed)
 Assessment / Plan  Lab Results   Component Value Date    A1C 10.5 (H) 09/27/2023    A1C 10.3 (H) 05/29/2023    A1C 9.1 (H) 11/24/2022     See above, will await income information to send to [employer]  _____________________________________________________

## 2023-12-07 DIAGNOSIS — Z794 Long term (current) use of insulin: Principal | ICD-10-CM

## 2023-12-07 DIAGNOSIS — E1165 Type 2 diabetes mellitus with hyperglycemia: Principal | ICD-10-CM

## 2023-12-07 DIAGNOSIS — I2510 Atherosclerotic heart disease of native coronary artery without angina pectoris: Principal | ICD-10-CM

## 2023-12-07 DIAGNOSIS — E119 Type 2 diabetes mellitus without complications: Principal | ICD-10-CM

## 2023-12-07 MED ORDER — NYSTATIN 100,000 UNIT/GRAM TOPICAL CREAM
Freq: Two times a day (BID) | TOPICAL | 1 refills | 0.00000 days | Status: CP
Start: 2023-12-07 — End: 2023-12-21

## 2023-12-07 NOTE — Unmapped (Unsigned)
 [facility] INTERNAL MEDICINE  [address] Madison Harris  [place] [address]  Ph: [phone]           ASSESSMENT & PLAN     Assessment & Plan  Transient Ischemic Attack (TIA)  Recent episode of transient right sided weakness with negative MRI.  Neurology follow-up scheduled. Plavix and aspirin initiated for prevention.  Repatha  ordered pending insurance approval.   -  Dual AP therapy with ASA dn Plavix x 3 weeks then ASA alone after that.   - Attend neurology appointment on Monday.  - Monitor for recurrent symptoms, return to ER if she occurs.  - OK to Continue CBD for joint pain relief.    Poorly Controlled Type 2 Diabetes Mellitus  A1c 10.6%. Current medications include Farxiga . Does not tolerate Metformin . Starting Glipizide and Ozempic .   - Start glipizide 5 mg once daily, increase to 5 mg twice daily after one week if tolerated.  - Send prescription for Ozempic  and Repatha , start with a one-month supply.  - Check blood sugar once or twice daily.  - Consider continuous glucose monitor with adhesive sensitivity precautions.    Hypertension  Blood pressure management suboptimal per home readings. Carvedilol  dose increased.    - Continue carvedilol  25 mg.  - Monitor blood pressure regularly, especially for readings below 100/70 mmHg and symptoms of falls/lightheadedness should prompt FU.   - Report any symptoms of hypotension.    Vulvar Pruritus  Chronic itching possibly related to uncontrolled diabetes or yeast infection. No discharge, area red and swollen. Initial treatment with antifungal cream recommended.  - Prescribe antifungal cream for 14 days.  - Reassess if no improvement in one week, consider topical steroid.  - Perform visual examination to rule out other causes.      Return precautions discussed, patient and daughter expresses understanding and agreement to plan. Denies any further questions or concerns at this time. NAD noted upon discharge.    I personally spent 57  minutes face-to-face and non-face-to-face in the care of this patient, which includes all pre, intra, and post visit time on the date of service.  All documented time was specific to the E/M visit and does not include any procedures that may have been performed. Coordination of care with CPP in house today also performed.       HPI     Chief Complaint   Patient presents with    Hospitalization Follow-up     Admitted to [facility] in [place] to rule out stroke/TIA.  Patient went to ED for lightheadedness right sided facial numbness right foot weakness and confusion.  Prior to ED arrival [facility] at home 432.  Discharged this past Tuesday.  Head CT, brain MRI and echo negative per patient.  Discharging MD thinks sx may have been related to patients BS     She is accompanied by her daughter, Madison Harris.    Madison Harris is a 68 y.o. female in today for 12/04/2023 [facility] follow up of stroke like symptoms. Presented with  transient right-sided weakness,    MRI negative, suspected TIA related/  started on aspirin and Plavix for 3 weeks plan for aspirin monotherapy after that Zio recommended but patient declined due to adhesive reaction.  Coreg  increased to 25 mg twice daily.       Additionally Her blood glucose was over 430 mg/dL prior to the emergency department visit, which decreased to 350-360 mg/dL in the ED. She has started checking  her blood glucose daily and has a neurology appointment scheduled for Monday.  Her diabetes is poorly controlled with a recent A1c of 10.6%. She is working with a pharmacist to get Ozempic  and Repatha  approved, and Farxiga  has been approved.  She has previously been on metformin  and glipizide but is not currently taking them. She cannot tolerate metformin  due to severe diarrhea. She has been prescribed glipizide but has not started it yet. She is currently taking Farxiga .    She has experienced intense itching in the genital area for nine months, which she scratches until it bleeds. There is no discharge, and the itching is external. She has not been to an OBGYN recently.    She has high blood pressure and is currently taking carvedilol , which was increased from 12.5 mg to 25 mg. She is also taking aspirin and Plavix, which was prescribed for three weeks after her recent hospital visit.      I have reviewed and updated the past medical, social, and family history today and updated them in the EMR.    EXAM & DATA   T 36.8 ??C (98.3 ??F) (Oral) - P 72 - RR   - SpO2 96%  BP   BP Readings from Last 3 Encounters:   12/07/23 108/64   09/27/23 128/72   05/29/23 102/62     Wt   Wt Readings from Last 3 Encounters:   12/07/23 68.1 kg (150 lb 3.2 oz)   09/27/23 67.7 kg (149 lb 4.8 oz)   11/24/22 68.3 kg (150 lb 9.6 oz)    -    Physical Exam Daughter present for exam, declined other chaperone  General: Alert, appropriately groomed, in no acute distress  EYES: Sclera anicteric, conjunctivae clear.    CV:   No extremity edema.   RESP: Even unlabored.   MSK:   Gait and station stable. Moving all 4 extremities.  GU: mild vulvar swelling and excoriation, erythematous patches at groin. No discharge.   PSYCH: Mood and affect appropriate.   NEURO: grossly non focal         Data Reviewed:  12/04/2023 [facility] admission 12/04/23 -12/05/23: strokelike symptoms transient right-sided weakness concern for TIA MRI negative started on aspirin and Plavix for 3 weeks plan for aspirin monotherapy after that 0 recommended but patient declined due to adhesive reaction.  Coreg  increased to 25 mg twice daily.  Poor glycemic control A1c 10.6% Glucotrol 5 mg added along with metformin  XR 1000 mg daily.  Should have follow-up with neurology.  Referral placed.  No visits with results within 30 Day(s) from this visit.   Latest known visit with results is:   Office Visit on 09/27/2023   Component Date Value Ref Range Status    Sodium 09/27/2023 135 (L)  136 - 145 mmol/L Final    Potassium 09/27/2023 4.4 3.5 - 5.1 mmol/L Final    Chloride 09/27/2023 100  95 - 110 mmol/L Final    CO2 09/27/2023 25.4  21.0 - 31.0 mmol/L Final    Anion Gap 09/27/2023 10  mmol/L Final    BUN 09/27/2023 19  5 - 26 mg/dL Final    Creatinine 95/90/7974 0.91  0.50 - 1.50 mg/dL Final    BUN/Creatinine Ratio 09/27/2023 21  12 - 25 Final    eGFR CKD-EPI (2021) Female 09/27/2023 70  >=60 mL/min/1.56m2 Final    eGFR calculated with CKD-EPI 2021 equation in accordance with [employer] and [employer] of Nephrology Task Force recommendations.  Glucose 09/27/2023 421 (H)  60 - 100 mg/dL Final    Calcium 95/90/7974 9.1  8.5 - 10.5 mg/dL Final    Albumin 95/90/7974 4.1  3.5 - 5.0 g/dL Final    Total Protein 09/27/2023 6.9  6.4 - 8.3 g/dL Final    Total Bilirubin 09/27/2023 0.6  0.0 - 1.5 mg/dL Final    AST 95/90/7974 20  7 - 31 U/L Final    ALT 09/27/2023 27  12 - 78 U/L Final    Alkaline Phosphatase 09/27/2023 97  50 - 136 U/L Final    Globulin, Total 09/27/2023 2.8  1.5 - 4.3 g/dL Final    Albumin/Globulin Ratio 09/27/2023 1.5  1.1 - 2.2 Final    Triglycerides 09/27/2023 312 (H)  <=150 mg/dL Final    Cholesterol, Total 09/27/2023 255 (H)  0 - 200 mg/dL Final    Cholesterol, HDL 09/27/2023 60  40 - 60 mg/dL Final    Cholesterol, LDL, Calculated 09/27/2023 133 (H)  30 - 130 mg/dL Final    VLDL Cholesterol Cal 09/27/2023 62.4 (H)  25 - 40 mg/dL Final    Chol/HDL Ratio 09/27/2023 4.3  0.0 - 4.5 Final    Cholesterol, Non-HDL, Calculated 09/27/2023 195  mg/dL Final    Hemoglobin J8R 09/27/2023 10.5 (H)  4.8 - 5.6 % Final    Estimated Average Glucose 09/27/2023 255  mg/dL Final    Vitamin A-87 95/90/7974 292  211 - 911 pg/ml Final     ----------------------------------------------------------------------------------------------------------------------  This note was dictated with Dragon Medical and may contain typos missed during proofreading. 09/27/2023 312 (H)  <=150 mg/dL Final    Cholesterol, Total 09/27/2023 255 (H)  0 - 200 mg/dL Final    Cholesterol, HDL 09/27/2023 60  40 - 60 mg/dL Final    Cholesterol, LDL, Calculated 09/27/2023 133 (H)  30 - 130 mg/dL Final    VLDL Cholesterol Cal 09/27/2023 62.4 (H)  25 - 40 mg/dL Final    Chol/HDL Ratio 09/27/2023 4.3  0.0 - 4.5 Final    Cholesterol, Non-HDL, Calculated 09/27/2023 195  mg/dL Final    Hemoglobin J8R 09/27/2023 10.5 (H)  4.8 - 5.6 % Final    Estimated Average Glucose 09/27/2023 255  mg/dL Final    Vitamin B-12 95/90/7974 292  211 - 911 pg/ml Final     ----------------------------------------------------------------------------------------------------------------------  This note was dictated with Dragon Medical and may contain typos missed during proofreading.

## 2023-12-13 DIAGNOSIS — E1165 Type 2 diabetes mellitus with hyperglycemia: Principal | ICD-10-CM

## 2023-12-13 MED ORDER — DEXCOM G7 SENSOR DEVICE
11 refills | 0.00000 days
Start: 2023-12-13 — End: ?

## 2023-12-13 NOTE — Unmapped (Signed)
 Patient is requesting the following refill  Requested Prescriptions     Pending Prescriptions Disp Refills    blood-glucose sensor (DEXCOM G7 SENSOR) Devi 3 each 11     Sig: 1 each by Miscellaneous route every ten (10) days.       Recent Visits  Date Type Provider Dept   12/07/23 Office Visit Joshua [facility]er Dragon, PA [facility] Internal Medicine   09/27/23 Office Visit Shara, Lamar Blase, MD [facility] Internal Medicine   06/29/23 Telemedicine Criss, Toribio Standing, CPP [facility] Internal Medicine   05/29/23 Office Visit Shara, Lamar Blase, MD [facility] Internal Medicine   Showing recent visits within past 365 days and meeting all other requirements  Future Appointments  Date Type Provider Dept   12/27/23 Appointment Joshua Comer Dragon, PA [facility] Internal Medicine   Showing future appointments within next 365 days and meeting all other requirements       Labs: Not applicable this refill

## 2023-12-18 MED ORDER — DEXCOM G7 SENSOR DEVICE
11 refills | 0.00000 days | Status: CP
Start: 2023-12-18 — End: ?

## 2023-12-20 NOTE — Unmapped (Signed)
 [place] [facility]  Embedded Clinic Visit      Name: Madison Harris  Date of Birth: [birth year]  Today's Date: 12/20/2023   Age: 68 y.o.      Patient Summary      Had met with patient and daughter briefly at visit on 6/19 with [facility]. They mentioned they were still interested in patient assistance applications and would send proof of income needed for [employer] and [employer]. Also mentioned they might be able to obtain medications from from local pharmacy/clinic. [facility] had printed prescription for both Repatha  and Ozempic . Based on fill history, both were filled 6/24. Called to check on need for patient assistance foundation applications. Left message.       Toribio Lehmann PharmD, BCPS, BCACP, BC-ADM  Clinical Pharmacist- [place] [facility] ([facility])  Direct Line: [phone] -  CAMP Clinic: [phone] - Fax: [phone]      Future Appointments   Date Time Provider Department Center   12/27/2023  1:00 PM Joshua Comer Dragon, PA [facility]       Current Medications:   Medications Ordered Prior to Encounter[1]            [1]   Current Outpatient Medications on File Prior to Visit   Medication Sig Dispense Refill    aspirin (ECOTRIN) 81 MG tablet Take 1 tablet (81 mg total) by mouth.      blood sugar diagnostic (ACCU-CHEK GUIDE TEST STRIPS) Strp Use to check blood sugar once daily (fasting) (Patient not taking: Reported on 09/27/2023) 100 each 3    blood-glucose meter kit Check blood sugar daily. 1 each 0    blood-glucose meter Misc Use once daily to check blood sugar as directed by your physician (Patient not taking: Reported on 09/27/2023) 1 each 0    blood-glucose sensor (DEXCOM G7 SENSOR) Devi 1 each by Miscellaneous route every ten (10) days. 3 each 11    carvedilol  (COREG ) 12.5 MG tablet Take 1 tablet (12.5 mg total) by mouth 2 (two) times a day with meals. 180 tablet 1    dapagliflozin  propanediol (FARXIGA ) 10 mg Tab tablet Take 1 tablet (10 mg total) by mouth every morning.      DULoxetine  (CYMBALTA ) 60 MG capsule Take 2 capsules (120 mg total) by mouth daily. 180 capsule 3    evolocumab  (REPATHA  SURECLICK) 140 mg/mL PnIj Inject the contents of one pen (140 mg) under the skin every fourteen (14) days. 2 mL 6    ezetimibe  (ZETIA ) 10 mg tablet Take 1 tablet (10 mg total) by mouth daily. 90 tablet 3    folic acid  (FOLVITE ) 1 MG tablet Take 1 tablet (1 mg total) by mouth daily.      glipiZIDE (GLUCOTROL XL) 5 MG 24 hr tablet Take 1 tablet (5 mg total) by mouth daily. (Patient not taking: Reported on 12/07/2023)      lancets Misc Use as directed (Patient not taking: Reported on 09/27/2023) 200 each 2    lancets Misc Use to check blood sugar once daily (fasting) (Patient not taking: Reported on 09/27/2023) 100 each 4    methotrexate  sodium (METHOTREXATE , CONTAINS PRESERVATIVES,) 25 mg/mL injection solution Inject under the skin once. (Patient taking differently: Inject 1 mL (25 mg total) under the skin once a week.)      nitroglycerin (NITROSTAT) 0.4 MG SL tablet PLACE 1 TABLET UNDER THE TONGUE EVERY 5 MINUTES AS NEEDED FOR CHEST PAIN UP TO 3 DOSES THEN  CALL 911 IF NO RELIEF      nystatin  (MYCOSTATIN ) 100,000 unit/gram cream Apply topically two (2) times a day for 14 days. 30 g 1    olmesartan  (BENICAR ) 40 MG tablet Take 1 tablet (40 mg total) by mouth daily. 90 tablet 4    pen needle, diabetic 31 gauge x 5/16 (8 mm) Ndle Use 1-3 times daily as directed. 100 each 3    PLAVIX 75 mg tablet Take 1 tablet (75 mg total) by mouth daily.      safety needles 25 gauge x 5/8 Ndle Use once a week for 4 weeks for b12 injections then once a month after 4 weeks 100 each 3    semaglutide  (OZEMPIC ) 0.25 mg or 0.5 mg (2 mg/3 mL) PnIj Inject 0.25 mg under the skin every seven (7) days for 28 days, THEN 0.5 mg every seven (7) days for 28 days. 6 mL 0    syringe with needle (BD LUER-LOK SYRINGE) 3 mL 25 gauge x 1 Syrg Use once a week for 4 weeks for b12 injections then once a month after 4 weeks 100 each 2     No current facility-administered medications on file prior to visit.

## 2023-12-27 DIAGNOSIS — N898 Other specified noninflammatory disorders of vagina: Principal | ICD-10-CM

## 2023-12-27 DIAGNOSIS — I10 Essential (primary) hypertension: Principal | ICD-10-CM

## 2023-12-27 DIAGNOSIS — F411 Generalized anxiety disorder: Principal | ICD-10-CM

## 2023-12-27 DIAGNOSIS — G4733 Obstructive sleep apnea (adult) (pediatric): Principal | ICD-10-CM

## 2023-12-27 DIAGNOSIS — E1165 Type 2 diabetes mellitus with hyperglycemia: Principal | ICD-10-CM

## 2023-12-27 MED ORDER — GLIPIZIDE ER 5 MG TABLET, EXTENDED RELEASE 24 HR
ORAL_TABLET | Freq: Every day | ORAL | 3 refills | 100.00000 days
Start: 2023-12-27 — End: 2024-12-26

## 2023-12-27 MED ORDER — SEMAGLUTIDE 1 MG/DOSE (4 MG/3 ML) SUBCUTANEOUS PEN INJECTOR
SUBCUTANEOUS | 3 refills | 84.00000 days | Status: CP
Start: 2023-12-27 — End: 2023-12-27

## 2023-12-27 MED ORDER — ESCITALOPRAM 5 MG TABLET
ORAL_TABLET | Freq: Every day | ORAL | 3 refills | 100.00000 days | Status: CP
Start: 2023-12-27 — End: 2025-01-30

## 2023-12-27 NOTE — Unmapped (Signed)
 [facility] INTERNAL MEDICINE  [address] [place]  [place] [address]  Ph: [phone]           ASSESSMENT & PLAN     Assessment & Plan  Type 2 Diabetes Mellitus  Last A1c 10.6% during 12/04/23 hospitalization. Due to med non compliance/cost issues. Has started on glipizide  and Ozempic , tolerating well. .    - New rx for Dexcom CGM provided.  - Increase glipizide  to 10 mg daily if tolerated, taking two 5 mg tablets in the morning.  - Avoid increasing glipizide  on the same day as Ozempic  administration.  - Monitor for hypoglycemia and gastrointestinal side effects.    Severe Obstructive Sleep Apnea  Severe sleep apnea with AHI of 63 on 2020 study. Not using CPAP due to discomfort and claustrophobia. Emphasized importance of treatment. Likely contributing to cognitive complaints as well as cardiovascular health.  - Order home sleep study.  - Discuss alternative CPAP options, such as nasal pillows, or if candidate for Inspiron.    - Consult with neurologist regarding sleep apnea management.     Anxiety  Experiences anxiety and depression due to home stressors and multiple health issues. Currently on Cymbalta , maxed out at 120mg . Discussed augmentation with Lexapro .  - Initiate Lexapro  5 mg daily in the morning.  - Continue Cymbalta .  - Monitor for side effects such as changes in sleep pattern, headaches, and gastrointestinal upset.  - Reassess in 6 weeks for improvement.    Hypertension  Blood pressure generally well-controlled with metoprolol . Target under 130/80 mmHg.  - Continue current metoprolol  regimen.  - Monitor blood pressure regularly, especially before medication intake.    Vaginal pruritus/yeast - has resolved with prescribed nystatin .    FU 2 months for labs and assess efficacy of med changes.     New Orders  -     glipiZIDE ; Take 2 tablets (10 mg total) by mouth in the morning.  -     Dexcom G7 Sensor; 1 each by Miscellaneous route every ten (10) days.  -     semaglutide ; Inject 1 mg under the skin every seven (7) days.  -     Home Sleep Study; Future  -     escitalopram  oxalate; Take 1 tablet (5 mg total) by mouth in the morning.    Return precautions discussed, patient and daughter express understanding and agreement to plan. Denies any further questions or concerns at this time. NAD noted upon discharge.      HPI     Chief Complaint   Patient presents with    Follow-up     History of Present Illness  [place] Harris is a 68 year old female with diabetes, hypertension, and sleep apnea who presents for follow-up on diabetes, hypertension, and vaginal pruritus. She is accompanied by her daughter, Burnard.      DM 2- Ran out of Dexcom CGM so not checking BS now but the first week it worked well and she thinks sugars were OK.  She is on glipizide  5mg  XL and started Ozempic  0.25mg  last week for diabetes management. She has been on Ozempic  for a week without side effects. An upset stomach occurred for one day but resolved. No reports of hypoglycemic symptoms.          Hypertension, she does not monitor her blood pressure daily. It was elevated one morning before medication but normalizes about an hour post-medication. No recent episodes of weakness, numbness, lightheadedness, or syncope.  Vaginal pruritus/yeast - has resolved with prescribed nystatin .    She has severe sleep apnea with last documented sleep study 2020 with AHI of 63 and does not use her CPAP due to discomfort and claustrophobia. Daytime sleepiness and snoring persist. Daughter concerned about poor meemory as well.      She experiences anxiety and depression, with symptoms of social withdrawal and prolonged bed rest. Good days and bad days. She is on Cymbalta  currently. Daughters feels she has checked out. Patient reports stress with husband.     She is scheduled to see a neurologist shortly for recent possible stroke,  memory loss and tremors, as observed by her daughter.        I have reviewed and updated the past medical, social, and family history today and updated them in the EMR.    EXAM & DATA   T   - P 68 - RR   - SpO2 97%  BP   BP Readings from Last 3 Encounters:   12/27/23 112/64   12/07/23 108/64   09/27/23 128/72     Wt   Wt Readings from Last 3 Encounters:   12/27/23 68.5 kg (151 lb 1.6 oz)   12/07/23 68.1 kg (150 lb 3.2 oz)   09/27/23 67.7 kg (149 lb 4.8 oz)    -    Physical Exam  General: Alert, appropriately groomed, in no acute distress  EYES: Sclera anicteric, conjunctivae clear.    CV:   No extremity edema.   RESP: Even unlabored.   MSK:   Gait and station stable. Moving all 4 extremities.  PSYCH: Mood and affect appropriate.   NEURO: grossly non focal    Data Reviewed:    ----------------------------------------------------------------------------------------------------------------------  This note was dictated with Dragon Medical and may contain typos missed during proofreading.

## 2023-12-29 NOTE — Unmapped (Addendum)
 Key: [id number]     Prior authorization for Dexcom G7 Sensor initiated in cover my meds. Pending payers response.

## 2024-01-03 NOTE — Unmapped (Signed)
 Prior authorization for Dexcom Sensor denied:    (1) Submission of medical records (for example: chart notes, laboratory values) or claims history documenting one of the following: (A) You are being treated with insulin . (B) You have a history of problematic hypoglycemia with documentation of recurrent level 2 hypoglycemic events [glucose less than 54mg /dl (3.0 mmol/L)] that persist despite multiple attempts to adjust medication(s) and/or modify the diabetes treatment plan. (C) You have a history of problematic hypoglycemia with documentation of a history of a level 3 hypoglycemic event [glucose less than 54mg /dl (3.0 mmol/L)] characterized by altered mental and/or physical state requiring third-party assistance for treatment of hypoglycemia. Reviewed by: R.Ph. **Please note: This review applies to Dexcom G6, Dexcom G7, Freestyle [place], [address], [address], [address] [address], [address] 3 and 4.

## 2024-01-05 NOTE — Unmapped (Signed)
 Patients requesting an appeal Fax: [phone]  Step 1: You, your representative, or your doctor can ask for an appeal. Your written request must include:   A Your name   A Address   A Plan Member number   A Reasons for appealing   A Whether you want a standard or fast appeal (for a fast appeal, explain why you need one). A Any evidence you want us  to review, like medical records, doctors supporting statements, or other information that explains why you need the medical service/item.

## 2024-02-06 DIAGNOSIS — G4733 Obstructive sleep apnea (adult) (pediatric): Principal | ICD-10-CM

## 2024-02-22 NOTE — Unmapped (Addendum)
 Cardiology referral  [facility] Cardiology  [address] [place], [place] [address]  Phone: [phone]  Appointments: [email or web address]    Rheumatology referral  [facility] Rheumatology  [facility]  Address: [address] [place], [place], [place] [address]  Phone: [phone]    What is Chronic Care Management (CCM)?  CCM is a program for patients with two or more medical conditions (examples are diabetes, high blood pressure, COPD, and more).  A Care Manager that works with your provider will become your go to person at the clinic. They will call you regularly to help you with any needs. You can also call them.     How could CCM benefit me?  Improve your health  Help you understand your medications and find ways to make your medications more affordable  Help you understand your medical conditions  Help you with any needs for transportation, food, housing   Referrals to other health care providers such as nutrition support, pharmacy, etc.  And much more!    Do I qualify for the program?   If you have Medicare Advantage: the program is likely free for you.   If you have Medicare as your primary insurance PLUS you have a secondary insurance: the program is likely free for you.   If you have Medicare as your primary insurance PLUS you have [facility]: the program is likely free for you.   **Contact your payer directly if you would like to know if you will have a copay. When you contact them, your payer will ask for specific billing codes. Please use these codes:  99437, [id number], [id number], and [id number].**    How do I sign up?   Signing up is really easy!    After discussing with your provider during your visit, they will enroll you in the program.  If your provider did not discuss with you today or you want to enroll after your visit, please contact your PCP.     What happens next?   You will get a phone call in the next two weeks from your care manager to talk about next steps. What if I have more questions?  Call [facility] Internal Medicine [phone] and ask to speak to the PCP.

## 2024-02-22 NOTE — Unmapped (Signed)
 Patient is aware of the services offered, that only one practitioner can furnish CCM, that there may be cost-sharing, and that they may withdraw at any time.         Patient Consent Status   During today's visit:   Who provided consent for Chronic Care Management? N/A  Patient not consented; patient will not be enrolled in CCM. CCM not appropriate at this time - attempt enrollment at next visit     Current Active List of Medical Conditions     Problem List             Diagnosed      Uncontrolled type 2 diabetes mellitus with hyperglycemia, without long-term current use of insulin        CAD s/p PCI (Chronic)       High blood pressure (Chronic)       Mixed hyperlipidemia (Chronic)       Muscle inflammation (Chronic)       Sleep apnea (Chronic)       Vitamin B12 deficiency (Chronic)       Nonadherence to medical treatment       Generalized anxiety disorder       Depression

## 2024-03-01 ENCOUNTER — Encounter: Admit: 2024-03-01 | Discharge: 2024-03-01 | Payer: Medicare (Managed Care)

## 2024-03-01 DIAGNOSIS — Z23 Encounter for immunization: Principal | ICD-10-CM

## 2024-03-01 DIAGNOSIS — G4733 Obstructive sleep apnea (adult) (pediatric): Principal | ICD-10-CM

## 2024-03-01 DIAGNOSIS — M5030 Other cervical disc degeneration, unspecified cervical region: Principal | ICD-10-CM

## 2024-03-01 DIAGNOSIS — E1165 Type 2 diabetes mellitus with hyperglycemia: Principal | ICD-10-CM

## 2024-03-01 DIAGNOSIS — F411 Generalized anxiety disorder: Principal | ICD-10-CM

## 2024-03-01 LAB — BASIC METABOLIC PANEL
ANION GAP: 7 mmol/L (ref 5–14)
BLOOD UREA NITROGEN: 14 mg/dL (ref 9–23)
BUN / CREAT RATIO: 16
CALCIUM: 10 mg/dL (ref 8.7–10.4)
CHLORIDE: 104 mmol/L (ref 98–107)
CO2: 30 mmol/L (ref 20.0–31.0)
CREATININE: 0.85 mg/dL (ref 0.55–1.02)
EGFR CKD-EPI (2021) FEMALE: 76 mL/min/1.73m2 (ref >=60)
GLUCOSE RANDOM: 101 mg/dL — ABNORMAL HIGH (ref 70–99)
POTASSIUM: 4.8 mmol/L (ref 3.4–4.8)
SODIUM: 141 mmol/L (ref 135–145)

## 2024-03-01 LAB — HEMOGLOBIN A1C
ESTIMATED AVERAGE GLUCOSE: 131 mg/dL
HEMOGLOBIN A1C: 6.2 % — ABNORMAL HIGH (ref 4.8–5.6)

## 2024-03-01 LAB — ALBUMIN / CREATININE URINE RATIO
ALBUMIN QUANT URINE: 0.8 mg/dL
ALBUMIN/CREATININE RATIO: 6.2 ug/mg (ref 0.0–30.0)
CREATININE, URINE: 128.3 mg/dL

## 2024-03-01 MED ORDER — OZEMPIC 2 MG/DOSE (8 MG/3 ML) SUBCUTANEOUS PEN INJECTOR
SUBCUTANEOUS | 11 refills | 28.00000 days | Status: CP
Start: 2024-03-01 — End: 2025-03-01

## 2024-03-01 NOTE — Unmapped (Signed)
 [facility] INTERNAL MEDICINE  [address] [place]  [place] [address]  Ph: [phone]           ASSESSMENT & PLAN     Assessment & Plan  Type 2 diabetes mellitus with hyperglycemia  Previous poor glycemic control. Last A1c 10.5%. does not check sugar at home. Discussed increase in Ozempic . Anticipated increase in blood glucose post-steroid injection.  - Check A1c, albumin/creat  today.  - Increase Ozempic  to 2 mg.  - Monitor blood glucose levels post-steroid injection.  - Contact office if blood glucose levels significantly elevate.  - FU 3 mo for recheck.    Cervical radiculopathy due to cervical disc degeneration with chronic right shoulder/arm/neck pain  Chronic pain from C6-C7 disc degeneration with foraminal impingement. Discussed NSAID risks and benefits.  - Proceed with steroid injection once approved.  - Use Tylenol for pain.  - Consider very short-term NSAID use with caution, starting with 400 mg ibuprofen twice a day for 2-3 days if needed.  - Consider gabapentin  for nighttime pain, starting at 100 mg and increasing to 300 mg if needed.  - Avoid NSAIDs and aspirin 5 days prior to steroid injection.    Obstructive sleep apnea  Severe obstructive sleep apnea per 12/2023 home study. Discussed CPAP therapy and mandibular advancement device options.  - Consider trial of CPAP therapy with a different mask.  - Consider mandibular advancement device if CPAP not tolerated.  - Discuss with dental provider for mandibular advancement device.    Venous insufficiency with chronic lower extremity skin changes  Chronic skin changes possibly due to venous insufficiency. Stable. FU if develops pain, swelling.     Generalized anxiety disorder  Mood greatly improved with low dose Lexapro . Tolerating well. Continue low dose.     General Health Maintenance  Discussed immunizations for flu, shingles, and pneumonia. Advised on shingles vaccine benefits.  - Administer flu and shingles vaccines today.  - Consider pneumococcal vaccine at next visit.    Referrals placed to establish care with Cardiology and Rheumatology specialists in [place] to be closer to home.   - establish care for stable CAD s/p PCI, HTN and stable PMR.     Return precautions discussed, patient and daughter express understanding and agreement to plan. Denies any further questions or concerns at this time. NAD noted upon discharge.    .I personally spent 43  minutes face-to-face and non-face-to-face in the care of this patient, which includes all pre, intra, and post visit time on the date of service.  All documented time was specific to the E/M visit and does not include any procedures that may have been performed.    HPI     Chief Complaint   Patient presents with    Follow-up     Patient is requesting to know what OTC med can she use for hair growth.     History of Present Illness  Mad[place] Harris is a 68 year old female with cervical radiculopathy and diabetes who presents for management of neck pain and diabetes control.    cervical radiculopathy . She experiences ongoing right-sided neck pain with associated nerve pain, impacting daily activities such as getting out of bed and walking due to pain. Some days better than others.SABRA An MRI on September 4th showed impingement at the C6 and C7 foramen. She is awaiting insurance approval for a steroid injection. She takes Cymbalta  for pain management, which helps with nerve pain, but on bad days,  the pain is intolerable. She inquires about using NSAIDs in addition to Tylenol for pain relief.    DM2 - we've been titrating Ozempic . On 1mg  for 2 months. She is on glipizide  and Farxiga  as well. he does not check her blood sugar levels. Her last A1c in April was 10.5. appetite is fine. No GI side effects with Ozempic .     She experiences fatigue and brain fog. She has a history of sleep apnea and has not used a CPAP machine consistently for a month. Recent sleep study showed severe sleep apnea per  AHI 3%.         I have reviewed and updated the past medical, social, and family history today and updated them in the EMR.    EXAM & DATA   T   - P 76 - RR   - SpO2 98%  BP   BP Readings from Last 3 Encounters:   03/01/24 124/72   12/27/23 112/64   12/07/23 108/64     Wt   Wt Readings from Last 3 Encounters:   03/01/24 68.5 kg (151 lb)   12/27/23 68.5 kg (151 lb 1.6 oz)   12/07/23 68.1 kg (150 lb 3.2 oz)    -    Physical Exam  General: Alert, appropriately groomed, in no acute distress  EYES: Sclera anicteric, conjunctivae clear.    CV:No extremity edema.   RESP: Even unlabored. .   MSK: No acute joint deformity. Gait and station stable. Moving all 4 extremities.  SKIN: scattered pinpoint pink macules to legs, non blanching, nontender.   PSYCH: Mood and affect appropriate.   NEURO: grossly non focal    Data Reviewed:  01/2024 Sleep study  Mild sleep apena (AHI 4% is 7.9%) vs AHI 3% 41.3 (severe sleep apnea)    Office Visit on 03/01/2024   Component Date Value Ref Range Status    Creat U 03/01/2024 128.3  Undefined mg/dL Final    Albumin Quantitative, Urine 03/01/2024 0.8  Undefined mg/dL Final    Albumin/Creatinine Ratio 03/01/2024 6.2  0.0 - 30.0 ug/mg Final    Hemoglobin A1C 03/01/2024 6.2 (H)  4.8 - 5.6 % Final    Estimated Average Glucose 03/01/2024 131  mg/dL Final    Sodium 90/87/7974 141  135 - 145 mmol/L Final    Potassium 03/01/2024 4.8  3.4 - 4.8 mmol/L Final    Chloride 03/01/2024 104  98 - 107 mmol/L Final    CO2 03/01/2024 30.0  20.0 - 31.0 mmol/L Final    Anion Gap 03/01/2024 7  5 - 14 mmol/L Final    BUN 03/01/2024 14  9 - 23 mg/dL Final    Creatinine 90/87/7974 0.85  0.55 - 1.02 mg/dL Final    BUN/Creatinine Ratio 03/01/2024 16   Final    eGFR CKD-EPI (2021) Female 03/01/2024 76  >=60 mL/min/1.24m2 Final    eGFR calculated with CKD-EPI 2021 equation in accordance with [employer] and [employer] of Nephrology Task Force recommendations.    Glucose 03/01/2024 101 (H)  70 - 99 mg/dL Final    Calcium 90/87/7974 10.0  8.7 - 10.4 mg/dL Final     ----------------------------------------------------------------------------------------------------------------------  This note was dictated with Dragon Medical and may contain typos missed during proofreading.

## 2024-03-06 NOTE — Unmapped (Signed)
 Prior authorization paperwork for Repatha  submitted to [employer] via covermymeds (key:  [id number]).      Royetta Irving, RN

## 2024-03-06 NOTE — Unmapped (Signed)
 Coverage of Repatha  approved by [employer] through 06/19/2024.  Informed patient's pharmacist by voice message.  Archived outcome on CoverMyMeds website.  Placed formal approval letter in scan basket.    Royetta Irving, RN

## 2024-04-12 DIAGNOSIS — E1165 Type 2 diabetes mellitus with hyperglycemia: Principal | ICD-10-CM

## 2024-04-12 MED ORDER — SAFETY NEEDLES 25 GAUGE X 5/8"
3 refills | 0.00000 days | Status: CP
Start: 2024-04-12 — End: ?

## 2024-04-12 MED ORDER — SEMAGLUTIDE 1 MG/DOSE (4 MG/3 ML) SUBCUTANEOUS PEN INJECTOR
SUBCUTANEOUS | 3 refills | 84.00000 days | Status: CP
Start: 2024-04-12 — End: 2025-04-12

## 2024-04-12 MED ORDER — BD LUER-LOK SYRINGE 3 ML 25 GAUGE X 1"
ORAL | 2 refills | 0.00000 days | Status: CP
Start: 2024-04-12 — End: ?

## 2024-04-12 NOTE — Telephone Encounter (Signed)
 Rx request

## 2024-04-18 DIAGNOSIS — E1165 Type 2 diabetes mellitus with hyperglycemia: Principal | ICD-10-CM

## 2024-04-18 NOTE — Assessment & Plan Note (Signed)
 Received notification for patient to re-enroll in [facility] and Me patient assistance program for Farxiga . Called patient and sent email/MyChart message with instructions for her to re-enroll in program to maintain access to her Farxiga  at no cost.   PCP may need to send updated order but looking at forms sent, that is not required at this moment (most likely after re-enrollment is confirmed)

## 2024-04-18 NOTE — Progress Notes (Signed)
 [facility] ([facility]) [facility]  Embedded Clinic Visit      Name: Sharan Mcenaney  Date of Birth: 04/06/1956  Today's Date: 04/18/2024   Age: 68 y.o.      Patient Summary    Visit Type: Documentation Only, Time spent with patient: 0    Assessment & Plan  Uncontrolled type 2 diabetes mellitus with hyperglycemia, without long-term current use of insulin  (CMS-HCC)    Received notification for patient to [place] in [place] and [place] patient [place] program for Farxiga . Called patient and sent email/MyChart message with instructions for her to re-enroll in program to maintain access to her Farxiga  at no cost.   PCP may need to send updated order but looking at forms sent, that is not required at this moment (most likely after re-enrollment is confirmed)         Medication Management   All medications including indication, dose, frequency, and affordability concerns (if applicable) and disease states were reviewed and assessed during visit. Notes and recommendations from today's visit have been sent to the appropriate provider(s).    Supervising physician for CPP: Joshua Comer Dragon, PA          Toribio Lehmann PharmD, BCPS, BCACP, BC-ADM  Clinical Pharmacist- [place] [facility] ([facility])  Direct Line: [phone] -  [facility] Clinic: [phone] - Fax: [phone]      Future Appointments   Date Time Provider Department Center   05/24/2024 10:00 AM Joshua, Comer Dragon, PA [facility]       Current Medications:   Medications Ordered Prior to Encounter[1]            [1]   Current Outpatient Medications on File Prior to Visit   Medication Sig Dispense Refill    aspirin (ECOTRIN) 81 MG tablet Take 1 tablet (81 mg total) by mouth.      blood sugar diagnostic (ACCU-CHEK GUIDE TEST STRIPS) Strp Use to check blood sugar once daily (fasting) 100 each 3    blood-glucose meter kit Check blood sugar daily. 1 each 0    blood-glucose meter Misc Use once daily to check blood sugar as directed by your physician 1 each 0    blood-glucose sensor (DEXCOM G7 SENSOR) Devi Use sensor as directed for glucose monitoring.  Change every 10 days. 3 each 11    blood-glucose sensor (DEXCOM G7 SENSOR) Devi 1 each by Miscellaneous route every ten (10) days. 3 each 11    carvedilol  (COREG ) 12.5 MG tablet Take 1 tablet (12.5 mg total) by mouth 2 (two) times a day with meals. 180 tablet 1    dapagliflozin  propanediol (FARXIGA ) 10 mg Tab tablet Take 1 tablet (10 mg total) by mouth every morning.      DULoxetine  (CYMBALTA ) 60 MG capsule Take 2 capsules (120 mg total) by mouth daily. 180 capsule 3    escitalopram  oxalate (LEXAPRO ) 5 MG tablet Take 1 tablet (5 mg total) by mouth in the morning. 100 tablet 3    ezetimibe  (ZETIA ) 10 mg tablet Take 1 tablet (10 mg total) by mouth daily. 90 tablet 3    folic acid  (FOLVITE ) 1 MG tablet Take 1 tablet (1 mg total) by mouth daily.      glipiZIDE  (GLUCOTROL  XL) 5 MG 24 hr tablet Take 2 tablets (10 mg total) by mouth in the morning. 200 tablet 3    lancets Misc Use as directed 200 each 2    lancets Misc Use to check  blood sugar once daily (fasting) 100 each 4    methotrexate  sodium (METHOTREXATE , CONTAINS PRESERVATIVES,) 25 mg/mL injection solution Inject under the skin once. (Patient taking differently: Inject 1 mL (25 mg total) under the skin once a week.)      nitroglycerin (NITROSTAT) 0.4 MG SL tablet PLACE 1 TABLET UNDER THE TONGUE EVERY 5 MINUTES AS NEEDED FOR CHEST PAIN UP TO 3 DOSES THEN CALL 911 IF NO RELIEF      olmesartan  (BENICAR ) 40 MG tablet Take 1 tablet (40 mg total) by mouth daily. 90 tablet 4    pen needle, diabetic 31 gauge x 5/16 (8 mm) Ndle Use 1-3 times daily as directed. 100 each 3    PLAVIX 75 mg tablet Take 1 tablet (75 mg total) by mouth daily.      safety needles 25 gauge x 5/8 Ndle Safety needles 100 each 3    semaglutide  (OZEMPIC ) 1 mg/dose (4 mg/3 mL) PnIj injection Inject 1 mg under the skin every seven (7) days. 9 mL 3    syringe with needle (BD LUER-LOK SYRINGE) 3 mL 25 gauge x 1 Syrg 3mL Syringes 100 each 2     No current facility-administered medications on file prior to visit.

## 2024-04-22 NOTE — Progress Notes (Signed)
 [place] [facility] ([facility]) [facility]-- Medication Adherence  CHART REVIEW    To patients reading this note:  Based on information provided by your insurance, the primary purpose of this chart review is to determine the need and/or appropriateness of statin therapy. Any suggested changes are intended to enhance the overall care you receive.      Population:  [employer]    A chart review was performed based on patient's history of diabetes and third party payer claims data.     Based on chart review, the patient may meet the following exclusion criteria:   Patient has well documented intolerance to statin therapy.                Toribio GORMAN Lehmann, CPP , PharmD  [facility] ([facility])

## 2024-05-28 NOTE — Progress Notes (Signed)
 Abstraction Result Flowsheet Data    This patient's last AWV date: : Not Found  This patients last WCC/CPE date: : 11/24/2022      Reason for Encounter  Reason for Encounter: Outreach  Primary Reason for Outreach: AWV  Text Message: No  MyChart Message: No  Outreach Call Outcome: Left Message

## 2024-07-24 NOTE — Progress Notes (Signed)
 Patient AWV scheduled for 3/3 @ 3:50pm with Andri    Abstraction Result Flowsheet Data    This patient's last AWV date: : Not Found  This patients last WCC/CPE date: : 11/24/2022    Reason for Encounter  Reason for Encounter: Outreach  Primary Reason for Outreach: AWV  Text Message: No  MyChart Message: No  Outreach Call Outcome: Scheduled Telephone

## 2024-07-24 NOTE — Progress Notes (Signed)
 Patient  scheduled for 2/13 @ 10AM with Comer    Abstraction Result Flowsheet Data    This patient's last AWV date: : Not Found  This patients last WCC/CPE date: : 11/24/2022    Reason for Encounter  Reason for Encounter: Outreach  Primary Reason for Outreach: Routine Follow-Up Appointment  Text Message: No  MyChart Message: No  Outreach Call Outcome: Scheduled
